# Patient Record
Sex: Female | Born: 1965 | Race: White | Hispanic: No | Marital: Married | Smoking: Never smoker
Health system: Southern US, Community
[De-identification: ages and names within clinical notes are randomized; demographics above are authoritative.]

## PROBLEM LIST (undated history)

## (undated) DIAGNOSIS — Z800 Family history of malignant neoplasm of digestive organs: Secondary | ICD-10-CM

## (undated) DIAGNOSIS — C801 Malignant (primary) neoplasm, unspecified: Secondary | ICD-10-CM

## (undated) DIAGNOSIS — Z86010 Personal history of colonic polyps: Secondary | ICD-10-CM

## (undated) DIAGNOSIS — C50919 Malignant neoplasm of unspecified site of unspecified female breast: Secondary | ICD-10-CM

## (undated) DIAGNOSIS — Z803 Family history of malignant neoplasm of breast: Secondary | ICD-10-CM

## (undated) DIAGNOSIS — K219 Gastro-esophageal reflux disease without esophagitis: Secondary | ICD-10-CM

## (undated) DIAGNOSIS — Z801 Family history of malignant neoplasm of trachea, bronchus and lung: Secondary | ICD-10-CM

## (undated) DIAGNOSIS — Z9221 Personal history of antineoplastic chemotherapy: Secondary | ICD-10-CM

## (undated) DIAGNOSIS — Z923 Personal history of irradiation: Secondary | ICD-10-CM

## (undated) HISTORY — PX: WISDOM TOOTH EXTRACTION: SHX21

## (undated) HISTORY — DX: Gastro-esophageal reflux disease without esophagitis: K21.9

## (undated) HISTORY — PX: SKIN CANCER EXCISION: SHX779

## (undated) HISTORY — PX: COLONOSCOPY: SHX174

## (undated) HISTORY — DX: Personal history of antineoplastic chemotherapy: Z92.21

## (undated) HISTORY — DX: Personal history of irradiation: Z92.3

## (undated) HISTORY — PX: DILATION AND CURETTAGE OF UTERUS: SHX78

## (undated) HISTORY — DX: Malignant neoplasm of unspecified site of unspecified female breast: C50.919

## (undated) HISTORY — DX: Personal history of colonic polyps: Z86.010

## (undated) HISTORY — DX: Family history of malignant neoplasm of breast: Z80.3

## (undated) HISTORY — PX: PARTIAL HYSTERECTOMY: SHX80

## (undated) HISTORY — DX: Family history of malignant neoplasm of digestive organs: Z80.0

## (undated) HISTORY — DX: Malignant (primary) neoplasm, unspecified: C80.1

## (undated) HISTORY — DX: Family history of malignant neoplasm of trachea, bronchus and lung: Z80.1

---

## 1998-05-16 ENCOUNTER — Other Ambulatory Visit: Admission: RE | Admit: 1998-05-16 | Discharge: 1998-05-16 | Payer: Self-pay | Admitting: Obstetrics & Gynecology

## 1999-05-28 ENCOUNTER — Other Ambulatory Visit: Admission: RE | Admit: 1999-05-28 | Discharge: 1999-05-28 | Payer: Self-pay | Admitting: Obstetrics & Gynecology

## 2000-06-22 ENCOUNTER — Other Ambulatory Visit: Admission: RE | Admit: 2000-06-22 | Discharge: 2000-06-22 | Payer: Self-pay | Admitting: Obstetrics & Gynecology

## 2001-08-27 ENCOUNTER — Other Ambulatory Visit: Admission: RE | Admit: 2001-08-27 | Discharge: 2001-08-27 | Payer: Self-pay | Admitting: Obstetrics & Gynecology

## 2002-08-11 ENCOUNTER — Encounter: Payer: Self-pay | Admitting: Family Medicine

## 2002-08-11 ENCOUNTER — Ambulatory Visit (HOSPITAL_COMMUNITY): Admission: RE | Admit: 2002-08-11 | Discharge: 2002-08-11 | Payer: Self-pay | Admitting: Family Medicine

## 2002-09-16 ENCOUNTER — Other Ambulatory Visit: Admission: RE | Admit: 2002-09-16 | Discharge: 2002-09-16 | Payer: Self-pay | Admitting: Obstetrics & Gynecology

## 2003-09-28 ENCOUNTER — Other Ambulatory Visit: Admission: RE | Admit: 2003-09-28 | Discharge: 2003-09-28 | Payer: Self-pay | Admitting: Obstetrics & Gynecology

## 2004-11-11 ENCOUNTER — Other Ambulatory Visit: Admission: RE | Admit: 2004-11-11 | Discharge: 2004-11-11 | Payer: Self-pay | Admitting: Obstetrics & Gynecology

## 2005-01-02 ENCOUNTER — Ambulatory Visit (HOSPITAL_COMMUNITY): Admission: RE | Admit: 2005-01-02 | Discharge: 2005-01-02 | Payer: Self-pay | Admitting: Obstetrics & Gynecology

## 2005-01-02 ENCOUNTER — Encounter (INDEPENDENT_AMBULATORY_CARE_PROVIDER_SITE_OTHER): Payer: Self-pay | Admitting: *Deleted

## 2008-01-19 ENCOUNTER — Encounter: Payer: Self-pay | Admitting: Internal Medicine

## 2008-01-19 ENCOUNTER — Ambulatory Visit: Payer: Self-pay | Admitting: Internal Medicine

## 2008-01-19 DIAGNOSIS — K625 Hemorrhage of anus and rectum: Secondary | ICD-10-CM | POA: Insufficient documentation

## 2008-02-07 ENCOUNTER — Ambulatory Visit: Payer: Self-pay | Admitting: Internal Medicine

## 2008-09-27 ENCOUNTER — Ambulatory Visit: Payer: Self-pay | Admitting: Internal Medicine

## 2008-09-27 ENCOUNTER — Ambulatory Visit (HOSPITAL_COMMUNITY): Admission: RE | Admit: 2008-09-27 | Discharge: 2008-09-27 | Payer: Self-pay | Admitting: Internal Medicine

## 2008-09-27 DIAGNOSIS — R1011 Right upper quadrant pain: Secondary | ICD-10-CM

## 2011-05-16 NOTE — Op Note (Signed)
Monica Padilla, Monica Padilla               ACCOUNT NO.:  [id number]   MEDICAL RECORD NO.:  [id number]          PATIENT TYPE:  AMB   LOCATION:  SDC                           FACILITY:  [facility]   PHYSICIAN:  Freddy Finner, M.D.   DATE OF BIRTH:  01/31/1966   DATE OF PROCEDURE:  01/02/2005  DATE OF DISCHARGE:                                 OPERATIVE REPORT   PREOPERATIVE DIAGNOSES:  Endometrial polyp, intramural myoma.   POSTOPERATIVE DIAGNOSES:  Endometrial polyp, intramural myoma.   OPERATIVE PROCEDURE:  Hysteroscopy, dilatation and curettage.   ANESTHESIA:  Managed intravenous sedation and paracervical block followed by  Toradol 30 mg IV, 30 mg IM on completion of the procedure for postoperative  analgesia.   SURGEON:  Dr. Jennette Kettle.   ESTIMATED BLOOD LOSS:  Less than 10 cc.   Approximately 100 cc.   INTRAOPERATIVE COMPLICATIONS:  None.   The patient is a 45 year old with increasing menorrhagia and flooding with  her menses.  Sonohysterogram in the office revealed an endometrial polyp and  an intramural myoma.  Options and therapy were discussed including NovaSure  which she has elected not to do at this point in time.  She was admitted now  for a hysteroscopy, D&C.   Intraoperative findings did reveal an endometrial polyp, intracavitary  synechia like lesions.  These were located to the left of midline.  The  polyp was anteriorly and superiorly in the endometrial cavity.   The patient was brought to the operating room and placed under adequate  intravenous sedation, placed in the dorsal lithotomy position, using the  Allen stirrup system.  Betadine prep was carried out in the standard  fashion.  Sterile drapes were applied.  The bivalve speculum was introduced.  The cervix was grasped on the anterior lip with a single-toothed tenaculum.  The uterus sounded to 9.5 cm.  The cervix was progressively dilated with  Pratt's to a #23.  A 12.5 degree ACMI hysteroscope was introduced.  Using  3%  Sorbitol as a distending medium, inspection revealed a polyp as noted above  and synechia on the left.  Gentle thorough curettage was carried out using a  sharp curet, a Haney curet.  Exploration with Randall-Stone forceps was  performed to recover tissue including polyps.  Re-inspection revealed  adequate sampling of the endometrium, adequate resection of the synechia and  of the polyps.  The procedure was terminated.  The instruments were removed.  The patient  was taken to recovery room in good condition.  She will be given routine  outpatient surgical instructions.  She was given Darvocet-N 100 to be taken  as needed for pain not relieved by ibuprofen.     Hosie Spangle   WRN/MEDQ  D:  01/02/2005  T:  01/02/2005  Job:  [id number]

## 2013-03-21 ENCOUNTER — Encounter: Payer: Self-pay | Admitting: Internal Medicine

## 2013-09-09 ENCOUNTER — Encounter: Payer: Self-pay | Admitting: Internal Medicine

## 2013-09-14 ENCOUNTER — Encounter: Payer: Self-pay | Admitting: Internal Medicine

## 2013-09-20 ENCOUNTER — Ambulatory Visit (AMBULATORY_SURGERY_CENTER): Payer: Self-pay | Admitting: *Deleted

## 2013-09-20 VITALS — Ht 61.0 in | Wt 121.0 lb

## 2013-09-20 DIAGNOSIS — Z800 Family history of malignant neoplasm of digestive organs: Secondary | ICD-10-CM

## 2013-09-20 MED ORDER — NA SULFATE-K SULFATE-MG SULF 17.5-3.13-1.6 GM/177ML PO SOLN: ORAL | Status: DC

## 2013-09-20 NOTE — Progress Notes (Signed)
No egg or soy allergy 

## 2013-09-22 ENCOUNTER — Encounter: Payer: Self-pay | Admitting: Internal Medicine

## 2013-10-04 ENCOUNTER — Ambulatory Visit (AMBULATORY_SURGERY_CENTER): Payer: BC Managed Care – PPO | Admitting: Internal Medicine

## 2013-10-04 ENCOUNTER — Encounter: Payer: Self-pay | Admitting: Internal Medicine

## 2013-10-04 VITALS — BP 108/67 | HR 56 | Temp 97.7°F | Resp 18 | Ht 61.0 in | Wt 121.0 lb

## 2013-10-04 DIAGNOSIS — Z800 Family history of malignant neoplasm of digestive organs: Secondary | ICD-10-CM

## 2013-10-04 DIAGNOSIS — Z1211 Encounter for screening for malignant neoplasm of colon: Secondary | ICD-10-CM

## 2013-10-04 DIAGNOSIS — D126 Benign neoplasm of colon, unspecified: Secondary | ICD-10-CM

## 2013-10-04 MED ORDER — SODIUM CHLORIDE 0.9 % IV SOLN: 500.0000 mL | INTRAVENOUS | Status: DC

## 2013-10-04 NOTE — Progress Notes (Signed)
Patient did not experience any of the following events: a burn prior to discharge; a fall within the facility; wrong site/side/patient/procedure/implant event; or a hospital transfer or hospital admission upon discharge from the facility. (G8907)Patient did not have preoperative order for IV antibiotic SSI prophylaxis. (G8918) ewm 

## 2013-10-04 NOTE — Progress Notes (Signed)
Called to room to assist during endoscopic procedure.  Patient ID and intended procedure confirmed with present staff. Received instructions for my participation in the procedure from the performing physician.  

## 2013-10-04 NOTE — Patient Instructions (Addendum)
I found and removed one polyp that looks benign. Everything else was normal - prep was great.  I will let you know pathology results and when to have another routine colonoscopy by mail.  I appreciate the opportunity to care for you. Iva Boop, MD, FACG  YOU HAD AN ENDOSCOPIC PROCEDURE TODAY AT THE [facility]: Refer to the procedure report that was given to you for any specific questions about what was found during the examination.  If the procedure report does not answer your questions, please call your gastroenterologist to clarify.  If you requested that your care partner not be given the details of your procedure findings, then the procedure report has been included in a sealed envelope for you to review at your convenience later.  YOU SHOULD EXPECT: Some feelings of bloating in the abdomen. Passage of more gas than usual.  Walking can help get rid of the air that was put into your GI tract during the procedure and reduce the bloating. If you had a lower endoscopy (such as a colonoscopy or flexible sigmoidoscopy) you may notice spotting of blood in your stool or on the toilet paper. If you underwent a bowel prep for your procedure, then you may not have a normal bowel movement for a few days.  DIET: Your first meal following the procedure should be a light meal and then it is ok to progress to your normal diet.  A half-sandwich or bowl of soup is an example of a good first meal.  Heavy or fried foods are harder to digest and may make you feel nauseous or bloated.  Likewise meals heavy in dairy and vegetables can cause extra gas to form and this can also increase the bloating.  Drink plenty of fluids but you should avoid alcoholic beverages for 24 hours.  ACTIVITY: Your care partner should take you home directly after the procedure.  You should plan to take it easy, moving slowly for the rest of the day.  You can resume normal activity the day after the procedure however you  should NOT DRIVE or use heavy machinery for 24 hours (because of the sedation medicines used during the test).    SYMPTOMS TO REPORT IMMEDIATELY: A gastroenterologist can be reached at any hour.  During normal business hours, 8:30 AM to 5:00 PM Monday through Friday, call [phone].  After hours and on weekends, please call the GI answering service at [phone]  Emergency number who will take a message and have the physician on call contact you.   Following lower endoscopy (colonoscopy or flexible sigmoidoscopy):  Excessive amounts of blood in the stool  Significant tenderness or worsening of abdominal pains  Swelling of the abdomen that is new, acute  Fever of 100F or higher  FOLLOW UP: If any biopsies were taken you will be contacted by phone or by letter within the next 1-3 weeks.  Call your gastroenterologist if you have not heard about the biopsies in 3 weeks.  Our staff will call the home number listed on your records the next business day following your procedure to check on you and address any questions or concerns that you may have at that time regarding the information given to you following your procedure. This is a courtesy call and so if there is no answer at the home number and we have not heard from you through the emergency physician on call, we will assume that you have returned to your regular daily activities  without incident.  SIGNATURES/CONFIDENTIALITY: You and/or your care partner have signed paperwork which will be entered into your electronic medical record.  These signatures attest to the fact that that the information above on your After Visit Summary has been reviewed and is understood.  Full responsibility of the confidentiality of this discharge information lies with you and/or your care-partner.  Handout on polyps

## 2013-10-04 NOTE — Op Note (Signed)
[facility] [address] [place], [address]   COLONOSCOPY PROCEDURE REPORT  PATIENT: Monica Padilla, Monica Padilla  MR#: [id number] BIRTHDATE: 20-Mar-1966 , 47  yrs. old GENDER: Female ENDOSCOPIST: Iva Boop, MD, [facility] PROCEDURE DATE:  10/04/2013 PROCEDURE:   Colonoscopy with snare polypectomy First Screening Colonoscopy - Avg.  risk and is 50 yrs.  old or older - No.  Prior Negative Screening - Now for repeat screening. Above average risk  History of Adenoma - Now for follow-up colonoscopy & has been > or = to 3 yrs.  N/A  Polyps Removed Today? Yes. ASA CLASS:   Class II INDICATIONS:Patient's immediate family history of colon cancer and elevated risk screening.   Father had metastatic colon cancer at 39. MEDICATIONS: propofol (Diprivan) 250mg  IV, MAC sedation, administered by CRNA, and These medications were titrated to patient response per physician's verbal order  DESCRIPTION OF PROCEDURE:   After the risks benefits and alternatives of the procedure were thoroughly explained, informed consent was obtained.  A digital rectal exam revealed no abnormalities of the rectum.   The LB [id number]  endoscope was introduced through the anus and advanced to the cecum, which was identified by both the appendix and ileocecal valve. No adverse events experienced.   The quality of the prep was excellent using Suprep  The instrument was then slowly withdrawn as the colon was fully examined.      COLON FINDINGS: A flat polyp measuring 1 cm in size was found in the distal transverse colon.  A polypectomy was performed with a cold snare.  The resection was complete and the polyp tissue was completely retrieved.   The colon mucosa was otherwise normal.   A right colon retroflexion was performed.  Retroflexed views revealed no abnormalities. The time to cecum=2 minutes 18 seconds. Withdrawal time=10 minutes 17 seconds.  The scope was withdrawn and the procedure  completed. COMPLICATIONS: There were no complications.  ENDOSCOPIC IMPRESSION: 1.   Flat polyp measuring 1 cm in size was found in the distal transverse colon; polypectomy was performed with a cold snare 2.   The colon mucosa was otherwise normal - excellent prep - second colonoscopy  RECOMMENDATIONS: Timing of repeat colonoscopy will be determined by pathology findings.   eSigned:  Iva Boop, MD, [facility] 10/04/2013 8:26 AM  cc: The Patient and Dario Ave, MD

## 2013-10-05 ENCOUNTER — Telehealth: Payer: Self-pay | Admitting: *Deleted

## 2013-10-05 NOTE — Telephone Encounter (Signed)
  Follow up Call-  Call back number 10/04/2013  Post procedure Call Back phone  # [phone]  Permission to leave phone message Yes     Patient questions:  Do you have a fever, pain , or abdominal swelling? no Pain Score  0 *  Have you tolerated food without any problems? yes  Have you been able to return to your normal activities? yes  Do you have any questions about your discharge instructions: Diet   no Medications  no Follow up visit  no  Do you have questions or concerns about your Care? no  Actions: * If pain score is 4 or above: No action needed, pain <4.

## 2013-10-12 ENCOUNTER — Encounter: Payer: Self-pay | Admitting: Internal Medicine

## 2013-10-12 NOTE — Progress Notes (Signed)
Quick Note:  Transverse 1cm + hyperplastic polyp and father had metastatic colon cancer at 71 Repeat colon 2019 ______

## 2016-06-28 HISTORY — PX: OTHER SURGICAL HISTORY: SHX169

## 2018-03-11 ENCOUNTER — Other Ambulatory Visit: Payer: Self-pay | Admitting: Obstetrics and Gynecology

## 2018-03-11 DIAGNOSIS — R928 Other abnormal and inconclusive findings on diagnostic imaging of breast: Secondary | ICD-10-CM

## 2018-03-17 ENCOUNTER — Ambulatory Visit
Admission: RE | Admit: 2018-03-17 | Discharge: 2018-03-17 | Disposition: A | Discharge status: Home / Self Care | Payer: BC Managed Care – PPO | Source: Ambulatory Visit | Attending: Obstetrics and Gynecology | Admitting: Obstetrics and Gynecology

## 2018-03-17 DIAGNOSIS — R928 Other abnormal and inconclusive findings on diagnostic imaging of breast: Secondary | ICD-10-CM

## 2018-03-29 HISTORY — PX: OTHER SURGICAL HISTORY: SHX169

## 2018-04-20 ENCOUNTER — Encounter: Payer: Self-pay | Admitting: Oncology

## 2018-04-20 ENCOUNTER — Telehealth: Payer: Self-pay | Admitting: Oncology

## 2018-04-20 NOTE — Telephone Encounter (Signed)
Received a call from [place] at Dr. Venita Sheffield office to schedule an appointment for new breast patient. Pt has been scheduled to see Dr. Jana Hakim on 5/6 at 4pm. Kenney Houseman will notify the pt. Letter and directions mailed to the pt.

## 2018-04-26 NOTE — Progress Notes (Addendum)
[place]  Telephone:(336) [phone] Fax:(336) [phone]     ID: Monica Padilla DOB: 06-29-1966  MR#: [id number]  [facility]#:[id number]  Patient Care Team: Algis Greenhouse, MD as PCP - General (Family Medicine) Signe Colt, MD as Referring Physician (Obstetrics and Gynecology) Nishtha Raider, Virgie Dad, MD as Consulting Physician (Oncology) Larey Dresser, MD as Consulting Physician (Cardiology) Noberto Retort Juanda Bond., MD as Referring Physician (Surgery) Gatha Mayer, MD as Consulting Physician (Gastroenterology) OTHER MD: Dr. Jimmye Norman and Dr. Michele Mcalpine at [place]: Estrogen receptor negative, HER-2 positive breast cancer  CURRENT TREATMENT: Chemo/immunotherapy   HISTORY OF CURRENT ILLNESS: The patient noted a palpable mass in her left breast as she was laying down. She brought this to medical attention immediately and had left diagnostic mammography with tomography at breast center  03/11/2018 showing a 2.5 cm palpable mass in the lower outer quadrant of the left breast, with one slightly prominent lymph node that had increased cortical measurement.  Biopsy of this left breast mass 03/17/2018 showed ([id number]), invasive ductal carcinoma, grade 2, estrogen and progesterone receptor negative, with an MIB-1 of 80%, but HER-2 amplified with a signals ratio 5.51 and the number per cell 17.35.  She underwent left mastectomy and sentinel lymph node sampling, as well as right-sided port placement 04/02/2018 under Etheleen Mayhew at [facility] for a 2.6 cm invasive ductal carcinoma, grade 3, with 1 of 3 sentinel nodes showing a micrometastatic deposit, but all margins negative.  She had a port placed at the same time.  The patient's subsequent history is as detailed below.  INTERVAL HISTORY: [name] was evaluated in the breast cancer clinic on 05/03/2018 accompanied by her husband, [name].   REVIEW OF SYSTEMS: Keymani takes protonix for occasional  GERD. The patient denies unusual headaches, visual changes, nausea, vomiting, stiff neck, dizziness, or gait imbalance. There has been no cough, phlegm production, or pleurisy, no chest pain or pressure, and no change in bowel or bladder habits. The patient denies fever, rash, bleeding, unexplained fatigue or unexplained weight loss. A detailed review of systems was otherwise entirely negative.   PAST MEDICAL HISTORY: Past Medical History:  Diagnosis Date  . Cancer    basal cell CA- chest, leg  . GERD (gastroesophageal reflux disease)   s.   PAST SURGICAL HISTORY: Past Surgical History:  Procedure Laterality Date  . PARTIAL HYSTERECTOMY    . SKIN CANCER EXCISION      FAMILY HISTORY Family History  Problem Relation Age of Onset  . Colon cancer Father   . Esophageal cancer Neg Hx   . Stomach cancer Neg Hx   . Rectal cancer Neg Hx    The patient's father died due to colon cancer at age 22. The patient's mother is alive at age 92. The patient has 1 sister and no brothers. A paternal cousin was diagnosed with breast cancer in the early 68's (before 52). The patient's paternal grandfather passed away from colon cancer. The patient's father had 3 brothers, one of whom had lung cancer.   GYNECOLOGIC HISTORY:  No LMP recorded. Patient has had a hysterectomy. Menarche: 52 years old Age at first live birth: 52 years old She is GXP2.  The patient is status post partial hysterectomy due to uterine fibroids. She retains her cervix and ovaries.  After her hysterectomy she used Estradiol with no complications.  She stopped hormone replacement March 2019.  She is now having hot flashes and nigh sweats that only allow her  to sleep 3 hours at a time.   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 Her husband, [name], is a Hydrologist for alarms and cameras for [employer]. The patient's oldest son, [name], is 15 and works as a Civil engineer, contracting in  [place]. The patient's son, [name], is 65 an works for a Dispensing optician. [name] lives at home with the patient. Azana does not have any grandchildren. She attends [employer].      ADVANCED DIRECTIVES:    HEALTH MAINTENANCE: Social History   Tobacco Use  . Smoking status: Never Smoker  . Smokeless tobacco: Never Used  Substance Use Topics  . Alcohol use: Yes    Comment: occasional  . Drug use: No     Colonoscopy: 10/04/2013/ Dr. Carlean Purl / polyp removal  PAP:  Bone density: remote   Allergies  Allergen Reactions  . Codeine Itching  . Nitrofurantoin     REACTION: pruritis  . Amoxicillin Rash    Current Outpatient Medications  Medication Sig Dispense Refill  . Ascorbic Acid (VITAMIN C PO) Take 500 mg by mouth daily. With rose hips    . Cholecalciferol (VITAMIN D-1000 MAX ST) 1000 units tablet Take by mouth.    . ciprofloxacin (CIPRO) 750 MG tablet Take by mouth.    . CRANBERRY PO Take by mouth daily.    . NON FORMULARY Whole- mega new chapter  1056m daily    . pantoprazole (PROTONIX) 40 MG tablet Take by mouth.    . dexamethasone (DECADRON) 4 MG tablet Take 2 tablets (8 mg total) by mouth 2 (two) times daily. Start the day before Taxotere. Take once the day after, then 2 times a day x 2d. 30 tablet 1  . lidocaine-prilocaine (EMLA) cream Apply to affected area once 30 g 3  . prochlorperazine (COMPAZINE) 10 MG tablet Take 1 tablet (10 mg total) by mouth every 6 (six) hours as needed (Nausea or vomiting). 30 tablet 1   No current facility-administered medications for this visit.     OBJECTIVE: Middle-aged white woman who appears well  Vitals:   05/03/18 1602  BP: (!) 154/83  Resp: 18  Temp: 98.2 F (36.8 C)  SpO2: 100%     Body mass index is 23.68 kg/m.   Wt Readings from Last 3 Encounters:  05/03/18 125 lb 4.8 oz (56.8 kg)  10/04/13 121 lb (54.9 kg)  09/20/13 121 lb (54.9 kg)      ECOG FS:0 - Asymptomatic  Ocular: Sclerae unicteric, pupils round  and equal Ear-nose-throat: Oropharynx clear and moist Lymphatic: No cervical or supraclavicular adenopathy Lungs no rales or rhonchi Heart regular rate and rhythm Abd soft, nontender, positive bowel sounds MSK no focal spinal tenderness, no joint edema Neuro: non-focal, well-oriented, appropriate affect Breasts: The right breast is unremarkable.  The left breast is status post mastectomy.  The incision is healed nicely, with no dehiscence, erythema, or swelling.  Both axillae are benign.   LAB RESULTS:  CMP  No results found for: NA, K, CL, CO2, GLUCOSE, BUN, CREATININE, CALCIUM, PROT, ALBUMIN, AST, ALT, ALKPHOS, BILITOT, GFRNONAA, GFRAA  No results found for: TOTALPROTELP, ALBUMINELP, A1GS, A2GS, BETS, BETA2SER, GAMS, MSPIKE, SPEI  No results found for: KPAFRELGTCHN, LAMBDASER, KAPLAMBRATIO  No results found for: WBC, NEUTROABS, HGB, HCT, MCV, PLT  '@LASTCHEMISTRY' @  No results found for: LABCA2  No components found for: [id number] No results for input(s): INR in the last 168 hours.  No results found  for: LABCA2  No results found for: PZW258  No results found for: NID782  No results found for: UMP536  No results found for: CA2729  No components found for: HGQUANT  No results found for: CEA1 / No results found for: CEA1   No results found for: AFPTUMOR  No results found for: CHROMOGRNA  No results found for: PSA1  No visits with results within 3 Day(s) from this visit.  Latest known visit with results is:  No results found for any previous visit.    (this displays the last labs from the last 3 days)  No results found for: TOTALPROTELP, ALBUMINELP, A1GS, A2GS, BETS, BETA2SER, GAMS, MSPIKE, SPEI (this displays SPEP labs)  No results found for: KPAFRELGTCHN, LAMBDASER, KAPLAMBRATIO (kappa/lambda light chains)  No results found for: HGBA, HGBA2QUANT, HGBFQUANT, HGBSQUAN (Hemoglobinopathy evaluation)   No results found for: LDH  No results found for: IRON,  TIBC, IRONPCTSAT (Iron and TIBC)  No results found for: FERRITIN  Urinalysis No results found for: COLORURINE, APPEARANCEUR, LABSPEC, PHURINE, GLUCOSEU, HGBUR, BILIRUBINUR, KETONESUR, PROTEINUR, UROBILINOGEN, NITRITE, LEUKOCYTESUR   STUDIES: She had bilateral screening mamm[facility] at [facility] 07[facility]10/2017 and next bilateral screening/diagnostic mammography will be due July 2019.  ELIGIBLE FOR AVAILABLE RESEARCH PROTOCOL: not discussed  ASSESSMENT: 52 y.o. [place], [place] woman status post left mastectomy and sentinel lymph node sampling 04/02/2018 for a pT2 pN1, stage IIB invasive ductal carcinoma, grade 3, estrogen and progesterone receptor negative, but HER-2 amplified  (1) genetics testing pending  (2) adjuvant chemotherapy will consist of carboplatin and docetaxel given every 21 days x 6, starting 05/13/2018  (3) trastuzumab and Pertuzumab will start concurrently with chemotherapy and continue for 1 year  (4) adjuvant radiation to follow  (5) referral to plastics placed 05/03/2018    PLAN: We spent the better part of today's hour-long appointment discussing the biology of her diagnosis and the specifics of her situation. We first reviewed the fact that cancer is not one disease but more than 100 different diseases and that it is important to keep them separate-- otherwise when friends and relatives discuss their own cancer experiences with [place] confusion can result. Similarly we explained that if breast cancer spreads to the bone or liver, the patient would not have bone cancer or liver cancer, but breast cancer in the bone and breast cancer in the liver: one cancer in three places-- not 3 different cancers which otherwise would have to be treated in 3 different ways.  We discussed the difference between local and systemic therapy. In terms of loco-regional treatment, she has had a left mastectomy and sentinel lymph node sampling and will have adjuvant radiation of the completion  of chemotherapy.  She does qualify for genetics testing.  This is being arranged.  If she does carry a deleterious mutation she would be interested in bilateral mastectomies with reconstruction.  In any case she might want reconstruction of the left breast and since we frequently get the expansion done before radiation starts I have asked 1 of our plastic surgeons to meet with her sometime around June or so to began discussing that in more detail.  We then went into the rationale for systemic therapy. There is some risk that this cancer may have already spread to other parts of her body. Patients frequently ask at this point about bone scans, CAT scans and PET scans to find out if they have occult breast cancer somewhere else. The problem is that in early stage disease we are much more  likely to find false positives then true cancers and this would expose the patient to unnecessary procedures as well as unnecessary radiation. Scans cannot answer the question the patient really would like to know, which is whether she has microscopic disease elsewhere in her body. For those reasons we do not recommend them.  Of course we would proceed to aggressive evaluation of any symptoms that might suggest metastatic disease, but that is not the case here.  Next we went over the options for systemic therapy which are anti-estrogens, anti-HER-2 immunotherapy, and chemotherapy. [facility] does not meet criteria for anti-estrogen therapy.  She is a very good candidate for HER-2 immunotherapy and chemotherapy.  Specifically we discussed the standard combination of carboplatin, docetaxel, trastuzumab and Pertuzumab given every 21 days for 6 cycles.  We discussed the possible toxicities, side effects and complications as well as the possible benefits.  She would like to get started and our target start date is 05/13/2018.  She already has a port in place.  She will need an echocardiogram and that order has been placed.  Once we  have the echo date we will try to schedule her for "chemotherapy school" on the same day to save her trip.  Note that I have offered to change her treatments to [facility] which would be a lot closer to her and I reassured her that she would get exactly the same drugs there.  However she has a friend who does come here from [facility] and she would like to have at least the chemotherapy portion of her treatment here.  Since the time to treatment is short and my schedule is very booked this month, I went ahead and gave her a copy of the "roadmap" on how to take her supportive medications and explained how to use it untreated.  She understands she will need to take dexamethasone the day before her first treatment.  Shirly has a good understanding of the overall plan. She agrees with it. She knows the goal of treatment in her case is cure. She will call with any problems that may develop before her next visit here.   Daune Divirgilio, Virgie Dad, MD  05/03/18 5:30 PM Medical Oncology and Hematology [facility] [address] [place], [place] [address] Tel. [phone]    Fax. [phone]  This document serves as a record of services personally performed by Lurline Del, MD. It was created on his behalf by Sheron Nightingale, a trained medical scribe. The creation of this record is based on the scribe's personal observations and the provider's statements to them.   I have reviewed the above documentation for accuracy and completeness, and I agree with the above.

## 2018-04-28 ENCOUNTER — Telehealth: Payer: Self-pay

## 2018-04-28 NOTE — Telephone Encounter (Signed)
Returned patient call to verify appointment. Per 5/30 voice mail

## 2018-05-03 ENCOUNTER — Inpatient Hospital Stay: Payer: BC Managed Care – PPO | Attending: Oncology | Admitting: Oncology

## 2018-05-03 VITALS — BP 154/83 | Temp 98.2°F | Resp 18 | Ht 61.0 in | Wt 125.3 lb

## 2018-05-03 DIAGNOSIS — T451X5A Adverse effect of antineoplastic and immunosuppressive drugs, initial encounter: Secondary | ICD-10-CM | POA: Diagnosis not present

## 2018-05-03 DIAGNOSIS — Z9012 Acquired absence of left breast and nipple: Secondary | ICD-10-CM | POA: Insufficient documentation

## 2018-05-03 DIAGNOSIS — Z85828 Personal history of other malignant neoplasm of skin: Secondary | ICD-10-CM | POA: Insufficient documentation

## 2018-05-03 DIAGNOSIS — Z5112 Encounter for antineoplastic immunotherapy: Secondary | ICD-10-CM | POA: Insufficient documentation

## 2018-05-03 DIAGNOSIS — R21 Rash and other nonspecific skin eruption: Secondary | ICD-10-CM | POA: Insufficient documentation

## 2018-05-03 DIAGNOSIS — Z5189 Encounter for other specified aftercare: Secondary | ICD-10-CM | POA: Insufficient documentation

## 2018-05-03 DIAGNOSIS — C50512 Malignant neoplasm of lower-outer quadrant of left female breast: Secondary | ICD-10-CM | POA: Diagnosis not present

## 2018-05-03 DIAGNOSIS — Z171 Estrogen receptor negative status [ER-]: Secondary | ICD-10-CM

## 2018-05-03 DIAGNOSIS — Z5111 Encounter for antineoplastic chemotherapy: Secondary | ICD-10-CM | POA: Insufficient documentation

## 2018-05-03 MED ORDER — DEXAMETHASONE 4 MG PO TABS: 8.0000 mg | ORAL_TABLET | Freq: Two times a day (BID) | ORAL | 1 refills | Status: DC

## 2018-05-03 MED ORDER — LIDOCAINE-PRILOCAINE 2.5-2.5 % EX CREA: TOPICAL_CREAM | CUTANEOUS | 3 refills | Status: DC

## 2018-05-03 MED ORDER — LORAZEPAM 0.5 MG PO TABS: 0.5000 mg | ORAL_TABLET | Freq: Every evening | ORAL | 1 refills | Status: DC | PRN

## 2018-05-03 MED ORDER — GABAPENTIN 300 MG PO CAPS: 300.0000 mg | ORAL_CAPSULE | Freq: Every day | ORAL | 4 refills | Status: DC

## 2018-05-03 MED ORDER — PROCHLORPERAZINE MALEATE 10 MG PO TABS: 10.0000 mg | ORAL_TABLET | Freq: Four times a day (QID) | ORAL | 1 refills | Status: DC | PRN

## 2018-05-03 NOTE — Progress Notes (Signed)
START ON PATHWAY REGIMEN - Breast     A cycle is every 21 days:     Docetaxel      Carboplatin      Trastuzumab      Trastuzumab      Pertuzumab      Pertuzumab   **Always confirm dose/schedule in your pharmacy ordering system**    Patient Characteristics: Postoperative without Neoadjuvant Therapy (Pathologic Staging), Invasive Disease, Adjuvant Therapy, HER2 Positive, ER Negative/Unknown, Node Positive Therapeutic Status: Postoperative without Neoadjuvant Therapy (Pathologic Staging) AJCC Grade: G3 AJCC N Category: pN1 AJCC M Category: cM0 ER Status: Negative (-) AJCC 8 Stage Grouping: IIB HER2 Status: Positive (+) Oncotype Dx Recurrence Score: Not Appropriate AJCC T Category: pT2 PR Status: Negative (-) Intent of Therapy: Curative Intent, Discussed with Patient

## 2018-05-04 ENCOUNTER — Telehealth: Payer: Self-pay | Admitting: Oncology

## 2018-05-04 NOTE — Telephone Encounter (Signed)
Per 5/6 no los °

## 2018-05-06 ENCOUNTER — Ambulatory Visit (HOSPITAL_COMMUNITY): Payer: BC Managed Care – PPO | Attending: Internal Medicine

## 2018-05-06 ENCOUNTER — Encounter: Payer: Self-pay | Admitting: Oncology

## 2018-05-06 ENCOUNTER — Other Ambulatory Visit: Payer: Self-pay

## 2018-05-06 DIAGNOSIS — Z171 Estrogen receptor negative status [ER-]: Secondary | ICD-10-CM

## 2018-05-06 DIAGNOSIS — C50512 Malignant neoplasm of lower-outer quadrant of left female breast: Secondary | ICD-10-CM

## 2018-05-06 DIAGNOSIS — C50212 Malignant neoplasm of upper-inner quadrant of left female breast: Secondary | ICD-10-CM | POA: Insufficient documentation

## 2018-05-06 NOTE — Progress Notes (Signed)
Called patient and left voicemail to introduce myself and to advise her of resources that may be available to her(i.e. [employer], [place] for Herceptin and Perjeta and [place] complete for [employer]. Left my contact name and number.

## 2018-05-07 ENCOUNTER — Encounter: Payer: Self-pay | Admitting: Oncology

## 2018-05-07 NOTE — Progress Notes (Signed)
Patient returned call.  Introduced myself as Arboriculturist and asked if she had any financial questions or concerns regarding treatment. She states not that she can think of. Asked if she knows if she has met her ded/OOP for insurance. She states she is not sure. Advised her of available copay assistance if she has not. Advised her to contact her insurance company to obtain this information and call me back if she would like to take advantage of this assistance. She verbalized understanding.  Patient returned my call and states she has not met her OOP but still has some claims pending. Advised her I proceed with enrolling her in the programs. She was very Patent attorney. Advised her that as far as sending claims to the company, this will be handled on this end. She verbalized understanding.  Enrolled patient in copay assistance through [facility] for Herceptin and Perjeta. Patient approved for $25,0000 for the calendar year only leaving her with a $5 copay for Herceptin after insurance pays.  Patient approved for $25,0000 for the calendar year only leaving her with a $5 copay for Perjeta after insurance pays.   Enrolled patient in [place] complete copay assistance for [employer]. Patient approved for maximum amount of $15,000 for the calendar year and it will leave her with a $0 copay after insurance pays.

## 2018-05-10 ENCOUNTER — Telehealth: Payer: Self-pay | Admitting: Oncology

## 2018-05-10 ENCOUNTER — Other Ambulatory Visit: Payer: Self-pay | Admitting: Oncology

## 2018-05-10 NOTE — Telephone Encounter (Signed)
Scheduled appt per 5/13 sch msg - left vm for pt re appts.

## 2018-05-10 NOTE — Progress Notes (Signed)
52 y.o. Monica Padilla, Monica Padilla woman status post left mastectomy and sentinel lymph node sampling 04/02/2018 for a pT2 pN1, stage IIB invasive ductal carcinoma, grade 3, estrogen and progesterone receptor negative, but HER-2 amplified  (1) genetics testing pending  (2) adjuvant chemotherapy will consist of carboplatin and docetaxel given every 21 days x 6, starting 05/13/2018  (3) trastuzumab and Pertuzumab will start concurrently with chemotherapy and continue for 1 year  (4) adjuvant radiation to follow  (5) referral to plastics placed 05/03/2018

## 2018-05-11 ENCOUNTER — Inpatient Hospital Stay: Payer: BC Managed Care – PPO

## 2018-05-13 ENCOUNTER — Inpatient Hospital Stay: Payer: BC Managed Care – PPO

## 2018-05-13 ENCOUNTER — Other Ambulatory Visit (HOSPITAL_COMMUNITY): Payer: Self-pay | Admitting: Pharmacist

## 2018-05-13 ENCOUNTER — Inpatient Hospital Stay (HOSPITAL_BASED_OUTPATIENT_CLINIC_OR_DEPARTMENT_OTHER): Payer: BC Managed Care – PPO | Admitting: Oncology

## 2018-05-13 ENCOUNTER — Telehealth: Payer: Self-pay | Admitting: *Deleted

## 2018-05-13 VITALS — BP 133/83 | HR 65 | Temp 98.4°F | Resp 17

## 2018-05-13 VITALS — BP 142/77 | HR 90 | Temp 98.1°F | Resp 18 | Ht 61.0 in | Wt 125.3 lb

## 2018-05-13 DIAGNOSIS — Z171 Estrogen receptor negative status [ER-]: Principal | ICD-10-CM

## 2018-05-13 DIAGNOSIS — C50512 Malignant neoplasm of lower-outer quadrant of left female breast: Secondary | ICD-10-CM

## 2018-05-13 DIAGNOSIS — Z85828 Personal history of other malignant neoplasm of skin: Secondary | ICD-10-CM

## 2018-05-13 DIAGNOSIS — Z9012 Acquired absence of left breast and nipple: Secondary | ICD-10-CM

## 2018-05-13 DIAGNOSIS — Z5111 Encounter for antineoplastic chemotherapy: Secondary | ICD-10-CM | POA: Diagnosis not present

## 2018-05-13 LAB — COMPREHENSIVE METABOLIC PANEL
ALT: 24 U/L (ref 0–55)
AST: 24 U/L (ref 5–34)
Albumin: 4.4 g/dL (ref 3.5–5.0)
Alkaline Phosphatase: 83 U/L (ref 40–150)
Anion gap: 7 (ref 3–11)
BUN: 11 mg/dL (ref 7–26)
CHLORIDE: 108 mmol/L (ref 98–109)
CO2: 25 mmol/L (ref 22–29)
CREATININE: 0.67 mg/dL (ref 0.60–1.10)
Calcium: 10.1 mg/dL (ref 8.4–10.4)
Glucose, Bld: 116 mg/dL (ref 70–140)
Potassium: 3.6 mmol/L (ref 3.5–5.1)
SODIUM: 140 mmol/L (ref 136–145)
Total Bilirubin: 0.2 mg/dL (ref 0.2–1.2)
Total Protein: 7.2 g/dL (ref 6.4–8.3)

## 2018-05-13 LAB — CBC WITH DIFFERENTIAL/PLATELET
Basophils Absolute: 0 10*3/uL (ref 0.0–0.1)
Basophils Relative: 0 %
EOS ABS: 0 10*3/uL (ref 0.0–0.5)
Eosinophils Relative: 0 %
HCT: 37.2 % (ref 34.8–46.6)
HEMOGLOBIN: 12.6 g/dL (ref 11.6–15.9)
LYMPHS ABS: 1 10*3/uL (ref 0.9–3.3)
Lymphocytes Relative: 7 %
MCH: 30.4 pg (ref 25.1–34.0)
MCHC: 33.9 g/dL (ref 31.5–36.0)
MCV: 89.9 fL (ref 79.5–101.0)
MONOS PCT: 6 %
Monocytes Absolute: 0.8 10*3/uL (ref 0.1–0.9)
NEUTROS ABS: 11.2 10*3/uL — AB (ref 1.5–6.5)
NEUTROS PCT: 87 %
Platelets: 179 10*3/uL (ref 145–400)
RBC: 4.14 MIL/uL (ref 3.70–5.45)
RDW: 12.8 % (ref 11.2–14.5)
WBC: 13 10*3/uL — AB (ref 3.9–10.3)

## 2018-05-13 MED ORDER — PALONOSETRON HCL INJECTION 0.25 MG/5ML
INTRAVENOUS | Status: AC
Start: 2018-05-13 — End: ?
  Filled 2018-05-13: qty 5

## 2018-05-13 MED ORDER — SODIUM CHLORIDE 0.9 % IV SOLN
Freq: Once | INTRAVENOUS | Status: AC
  Administered 2018-05-13: 11:00:00 via INTRAVENOUS

## 2018-05-13 MED ORDER — HEPARIN SOD (PORK) LOCK FLUSH 100 UNIT/ML IV SOLN
500.0000 [IU] | Freq: Once | INTRAVENOUS | Status: AC | PRN
  Administered 2018-05-13: 500 [IU]
  Filled 2018-05-13: qty 5

## 2018-05-13 MED ORDER — SODIUM CHLORIDE 0.9 % IV SOLN
420.0000 mg | Freq: Once | INTRAVENOUS | Status: AC
  Administered 2018-05-13: 420 mg via INTRAVENOUS
  Filled 2018-05-13: qty 14

## 2018-05-13 MED ORDER — TRASTUZUMAB CHEMO 150 MG IV SOLR
450.0000 mg | Freq: Once | INTRAVENOUS | Status: AC
  Administered 2018-05-13: 450 mg via INTRAVENOUS
  Filled 2018-05-13: qty 21.43

## 2018-05-13 MED ORDER — SODIUM CHLORIDE 0.9 % IV SOLN
Freq: Once | INTRAVENOUS | Status: AC
  Administered 2018-05-13: 16:00:00 via INTRAVENOUS
  Filled 2018-05-13: qty 5

## 2018-05-13 MED ORDER — DOCETAXEL CHEMO INJECTION 160 MG/16ML
75.0000 mg/m2 | Freq: Once | INTRAVENOUS | Status: AC
  Administered 2018-05-13: 120 mg via INTRAVENOUS
  Filled 2018-05-13: qty 12

## 2018-05-13 MED ORDER — SODIUM CHLORIDE 0.9 % IV SOLN
498.0000 mg | Freq: Once | INTRAVENOUS | Status: AC
  Administered 2018-05-13: 500 mg via INTRAVENOUS
  Filled 2018-05-13: qty 50

## 2018-05-13 MED ORDER — DIPHENHYDRAMINE HCL 25 MG PO CAPS
ORAL_CAPSULE | ORAL | Status: AC
  Filled 2018-05-13: qty 2

## 2018-05-13 MED ORDER — ACETAMINOPHEN 325 MG PO TABS
ORAL_TABLET | ORAL | Status: AC
  Filled 2018-05-13: qty 2

## 2018-05-13 MED ORDER — SODIUM CHLORIDE 0.9% FLUSH
10.0000 mL | INTRAVENOUS | Status: DC | PRN
  Administered 2018-05-13: 10 mL
  Filled 2018-05-13: qty 10

## 2018-05-13 MED ORDER — DIPHENHYDRAMINE HCL 25 MG PO CAPS
50.0000 mg | ORAL_CAPSULE | Freq: Once | ORAL | Status: AC
  Administered 2018-05-13: 50 mg via ORAL

## 2018-05-13 MED ORDER — PALONOSETRON HCL INJECTION 0.25 MG/5ML
0.2500 mg | Freq: Once | INTRAVENOUS | Status: AC
  Administered 2018-05-13: 0.25 mg via INTRAVENOUS

## 2018-05-13 MED ORDER — ACETAMINOPHEN 325 MG PO TABS
650.0000 mg | ORAL_TABLET | Freq: Once | ORAL | Status: AC
  Administered 2018-05-13: 650 mg via ORAL

## 2018-05-13 NOTE — Addendum Note (Signed)
Addended by: Chauncey Cruel on: 05/13/2018 01:30 PM   Modules accepted: Orders

## 2018-05-13 NOTE — Progress Notes (Signed)
[facility]  [phone] [phone]) [phone]     ID: Monica Padilla DOB: [birth year]  MR#: [id number]  OMV#:[id number]  Patient Care Team: Algis Greenhouse, MD as PCP - General (Family Medicine) Signe Colt, MD as Referring Physician (Obstetrics and Gynecology) Miho Monda, Virgie Dad, MD as Consulting Physician (Oncology) Larey Dresser, MD as Consulting Physician (Cardiology) Noberto Retort Juanda Bond., MD as Referring Physician (Surgery) Gatha Mayer, MD as Consulting Physician (Gastroenterology) Nolon Nations, MD as Consulting Physician (Diagnostic Radiology) OTHER MD: Dr. Jimmye Norman and Dr. Michele Mcalpine at [facility]: Estrogen receptor negative, HER-2 positive breast cancer  CURRENT TREATMENT: Chemo/immunotherapy   HISTORY OF CURRENT ILLNESS: From the original intake note:  The patient noted a palpable mass in her left breast as she was laying down. She brought this to medical attention immediately and had left diagnostic mammography with tomography at breast center  03/11/2018 showing a 2.5 cm palpable mass in the lower outer quadrant of the left breast, with one slightly prominent lymph node that had increased cortical measurement.  Biopsy of this left breast mass 03/17/2018 showed ([id number]), invasive ductal carcinoma, grade 2, estrogen and progesterone receptor negative, with an MIB-1 of 80%, but HER-2 amplified with a signals ratio 5.51 and the number per cell 17.35.  She underwent left mastectomy and sentinel lymph node sampling, as well as right-sided port placement 04/02/2018 under Etheleen Mayhew at [facility] for a 2.6 cm invasive ductal carcinoma, grade 3, with 1 of 3 sentinel nodes showing a micrometastatic deposit, but all margins negative.  She had a port placed at the same time.  The patient's subsequent history is as detailed below.  INTERVAL HISTORY: Kalaysia returns today for a follow-up and treatment of her  estrogen receptor negative, HER-2 positive breast cancer. She is accompanied by her husband, [name]. Today is day 1 cycle 1 of trastuzumab/Pertuzumab/carboplatin/docetaxel, which she will receive every 21 days for 6 cycles.  REVIEW OF SYSTEMS: Captola is doing well. She has been staying with work and gardening. She reports her flowers look great. She did not have any trouble sleeping last night after taking steroids for her first treatment today. She denies unusual headaches, visual changes, nausea, vomiting, or dizziness. There has been no unusual cough, phlegm production, or pleurisy. This been no change in bowel or bladder habits. She denies unexplained fatigue or unexplained weight loss, bleeding, rash, or fever. A detailed review of systems was otherwise noncontributory.    PAST MEDICAL HISTORY: Past Medical History:  Diagnosis Date  . Cancer    basal cell CA- chest, leg  . GERD (gastroesophageal reflux disease)   s.   PAST SURGICAL HISTORY: Past Surgical History:  Procedure Laterality Date  . PARTIAL HYSTERECTOMY    . SKIN CANCER EXCISION      FAMILY HISTORY Family History  Problem Relation Age of Onset  . Colon cancer Father   . Esophageal cancer Neg Hx   . Stomach cancer Neg Hx   . Rectal cancer Neg Hx    The patient's father died due to colon cancer at age 74. The patient's mother is alive at age 49. The patient has 1 sister and no brothers. A paternal cousin was diagnosed with breast cancer in the early 59's (before 19). The patient's paternal grandfather passed away from colon cancer. The patient's father had 3 brothers, one of whom had lung cancer.   GYNECOLOGIC HISTORY:  No LMP recorded. Patient has had a hysterectomy. Menarche: 52 years  old Age at first live birth: 52 years old She is GXP2.  The patient is status post partial hysterectomy due to uterine fibroids. She retains her cervix and ovaries.  After her hysterectomy she used Estradiol with no complications.  She  stopped hormone replacement March 2019.  She is now having hot flashes and nigh sweats that only allow her to sleep 3 hours at a time.   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 Her husband, [name], is a Hydrologist for alarms and cameras for [employer]. The patient's oldest son, [name], is 31 and works as a Civil engineer, contracting in [place]. The patient's son, [name], is 68 an works for a Dispensing optician. [name] lives at home with the patient. Britzy does not have any grandchildren. She attends [employer].      ADVANCED DIRECTIVES:    HEALTH MAINTENANCE: Social History   Tobacco Use  . Smoking status: Never Smoker  . Smokeless tobacco: Never Used  Substance Use Topics  . Alcohol use: Yes    Comment: occasional  . Drug use: No     Colonoscopy: 10/04/2013/ Dr. Carlean Purl / polyp removal  PAP:  Bone density: remote   Allergies  Allergen Reactions  . Codeine Itching  . Nitrofurantoin     REACTION: pruritis  . Amoxicillin Rash    Current Outpatient Medications  Medication Sig Dispense Refill  . Ascorbic Acid (VITAMIN C PO) Take 500 mg by mouth daily. With rose hips    . Cholecalciferol (VITAMIN D-1000 MAX ST) 1000 units tablet Take by mouth.    . ciprofloxacin (CIPRO) 750 MG tablet Take by mouth.    . CRANBERRY PO Take by mouth daily.    Marland Kitchen dexamethasone (DECADRON) 4 MG tablet Take 2 tablets (8 mg total) by mouth 2 (two) times daily. Start the day before Taxotere. Take once the day after, then 2 times a day x 2d. 30 tablet 1  . gabapentin (NEURONTIN) 300 MG capsule Take 1 capsule (300 mg total) by mouth at bedtime. 90 capsule 4  . lidocaine-prilocaine (EMLA) cream Apply to affected area once 30 g 3  . LORazepam (ATIVAN) 0.5 MG tablet Take 1 tablet (0.5 mg total) by mouth at bedtime as needed for anxiety. 30 tablet 1  . NON FORMULARY Whole- mega new chapter  1066m daily    . pantoprazole (PROTONIX) 40 MG tablet Take by  mouth.    . prochlorperazine (COMPAZINE) 10 MG tablet Take 1 tablet (10 mg total) by mouth every 6 (six) hours as needed (Nausea or vomiting). 30 tablet 1   No current facility-administered medications for this visit.     OBJECTIVE: Middle-aged white woman in no acute distress  Vitals:   05/13/18 0920  BP: (!) 142/77  Pulse: 90  Resp: 18  Temp: 98.1 F (36.7 C)  SpO2: 100%     Body mass index is 23.68 kg/m.   Wt Readings from Last 3 Encounters:  05/13/18 125 lb 4.8 oz (56.8 kg)  05/03/18 125 lb 4.8 oz (56.8 kg)  10/04/13 121 lb (54.9 kg)      ECOG FS:0 - Asymptomatic  Sclerae unicteric, EOMs intact Oropharynx clear and moist No cervical or supraclavicular adenopathy Lungs no rales or rhonchi Heart regular rate and rhythm Abd soft, nontender, positive bowel sounds MSK no focal spinal tenderness, no upper extremity lymphedema Neuro: nonfocal, well oriented, appropriate affect Breasts: The right breast is benign.  The left breast has undergone  mastectomy.  There is no evidence of residual or recurrent disease.  Both axillae are benign.  LAB RESULTS:  CMP  No results found for: NA, K, CL, CO2, GLUCOSE, BUN, CREATININE, CALCIUM, PROT, ALBUMIN, AST, ALT, ALKPHOS, BILITOT, GFRNONAA, GFRAA  No results found for: TOTALPROTELP, ALBUMINELP, A1GS, A2GS, BETS, BETA2SER, GAMS, MSPIKE, SPEI  No results found for: KPAFRELGTCHN, LAMBDASER, KAPLAMBRATIO  No results found for: WBC, NEUTROABS, HGB, HCT, MCV, PLT  '@LASTCHEMISTRY' @  No results found for: LABCA2  No components found for: LPFXTK240  No results for input(s): INR in the last 168 hours.  No results found for: LABCA2  No results found for: XBD532  No results found for: DJM426  No results found for: STM196  No results found for: CA2729  No components found for: HGQUANT  No results found for: CEA1 / No results found for: CEA1   No results found for: AFPTUMOR  No results found for: CHROMOGRNA  No results  found for: PSA1  No visits with results within 3 Day(s) from this visit.  Latest known visit with results is:  No results found for any previous visit.    (this displays the last labs from the last 3 days)  No results found for: TOTALPROTELP, ALBUMINELP, A1GS, A2GS, BETS, BETA2SER, GAMS, MSPIKE, SPEI (this displays SPEP labs)  No results found for: KPAFRELGTCHN, LAMBDASER, KAPLAMBRATIO (kappa/lambda light chains)  No results found for: HGBA, HGBA2QUANT, HGBFQUANT, HGBSQUAN (Hemoglobinopathy evaluation)   No results found for: LDH  No results found for: IRON, TIBC, IRONPCTSAT (Iron and TIBC)  No results found for: FERRITIN  Urinalysis No results found for: COLORURINE, APPEARANCEUR, LABSPEC, PHURINE, GLUCOSEU, HGBUR, BILIRUBINUR, KETONESUR, PROTEINUR, UROBILINOGEN, NITRITE, LEUKOCYTESUR   STUDIES: She had bilateral screening mamm[facility] at [facility] 07[facility]10[facility]2017 and next bilateral screening/diagnostic mammography will be due July 2019.  ELIGIBLE FOR AVAILABLE RESEARCH PROTOCOL: not discussed  ASSESSMENT: 52 y.o. [place], [place] woman status post left mastectomy and sentinel lymph node sampling 04/02/2018 for a pT2 pN1, stage IIB invasive ductal carcinoma, grade 3, estrogen and progesterone receptor negative, but HER-2 amplified  (1) genetics testing pending  (2) adjuvant chemotherapy will consist of carboplatin and docetaxel given every 21 days x 6, starting 05/13/2018  (3) trastuzumab and Pertuzumab will start concurrently with chemotherapy and continue for 1 year  (a) echocardiogram 05/06/2018 shows an ejection fraction in the 65-70%  (4) adjuvant radiation to follow  (5) referral to plastics placed 05/03/2018    PLAN: Sherlie took her steroids yesterday as instructed and had no significant problems with sleep.  She has a very good understanding of all her supportive medicines and she has a copy of the "roadmap" on how to take them.  She will start her chemotherapy  today.  We reviewed side effects toxicities and complications.  She will return on Saturday for her first Neulasta dose.  She will then return to see me 05/21/2018 at which time we will review complications.  I have asked her to keep a diary of side effects so we can optimize the subsequent cycles.  She is already prepared to lose her hair and has bought 2 weeks  I encouraged her to continue to be as active as possible through her treatments  She knows to call for any problems that may develop before the next visit.  Hideko Esselman, Virgie Dad, MD  05/13/18 9:48 AM Medical Oncology and Hematology [facility] [address] [place], [place] [address] Tel. [phone]    Fax. [phone]  This document  serves as a record of services personally performed by Chauncey Cruel, MD. It was created on his behalf by Margit Banda, a trained medical scribe. The creation of this record is based on the scribe's personal observations and the provider's statements to them.   I have reviewed the above documentation for accuracy and completeness, and I agree with the above.

## 2018-05-13 NOTE — Telephone Encounter (Signed)
Pt needs labs for dosing of carboplatin per scheduled treatment today.  No lab appointment scheduled.  Per MD - draw labs with port access in chemo room- may start premeds, herceptin, perjeta.   Carbo will be dosed upon creatinine per CMET drawn today.  Pharmacy informed of above with understanding and agreement to plan.   RN in treatment room notified of above.  Orders placed in chart/lab notified/ tubes and labels will be sent to treatment room.

## 2018-05-13 NOTE — Patient Instructions (Signed)
[facility] Discharge Instructions for Patients Receiving Chemotherapy  Today you received the following chemotherapy agents: Herceptin, Perjeta, Taxotere, Carboplatin    To help prevent nausea and vomiting after your treatment, we encourage you to take your nausea medication as prescribed.   If you develop nausea and vomiting that is not controlled by your nausea medication, call the clinic.   BELOW ARE SYMPTOMS THAT SHOULD BE REPORTED IMMEDIATELY:  *FEVER GREATER THAN 100.5 F  *CHILLS WITH OR WITHOUT FEVER  NAUSEA AND VOMITING THAT IS NOT CONTROLLED WITH YOUR NAUSEA MEDICATION  *UNUSUAL SHORTNESS OF BREATH  *UNUSUAL BRUISING OR BLEEDING  TENDERNESS IN MOUTH AND THROAT WITH OR WITHOUT PRESENCE OF ULCERS  *URINARY PROBLEMS  *BOWEL PROBLEMS  UNUSUAL RASH Items with * indicate a potential emergency and should be followed up as soon as possible.  Feel free to call the clinic should you have any questions or concerns. The clinic phone number is [phone].  Please show the [facility] at check-in to the Emergency Department and triage nurse.  Trastuzumab injection for infusion What is this medicine? TRASTUZUMAB (tras TOO zoo mab) is a monoclonal antibody. It is used to treat breast cancer and stomach cancer. This medicine may be used for other purposes; ask your health care provider or pharmacist if you have questions. COMMON BRAND NAME(S): Herceptin What should I tell my health care provider before I take this medicine? They need to know if you have any of these conditions: -heart disease -heart failure -lung or breathing disease, like asthma -an unusual or allergic reaction to trastuzumab, benzyl alcohol, or other medications, foods, dyes, or preservatives -pregnant or trying to get pregnant -breast-feeding How should I use this medicine? This drug is given as an infusion into a vein. It is administered in a hospital or clinic by a specially  trained health care professional. Talk to your pediatrician regarding the use of this medicine in children. This medicine is not approved for use in children. Overdosage: If you think you have taken too much of this medicine contact a poison control center or emergency room at once. NOTE: This medicine is only for you. Do not share this medicine with others. What if I miss a dose? It is important not to miss a dose. Call your doctor or health care professional if you are unable to keep an appointment. What may interact with this medicine? This medicine may interact with the following medications: -certain types of chemotherapy, such as daunorubicin, doxorubicin, epirubicin, and idarubicin This list may not describe all possible interactions. Give your health care provider a list of all the medicines, herbs, non-prescription drugs, or dietary supplements you use. Also tell them if you smoke, drink alcohol, or use illegal drugs. Some items may interact with your medicine. What should I watch for while using this medicine? Visit your doctor for checks on your progress. Report any side effects. Continue your course of treatment even though you feel ill unless your doctor tells you to stop. Call your doctor or health care professional for advice if you get a fever, chills or sore throat, or other symptoms of a cold or flu. Do not treat yourself. Try to avoid being around people who are sick. You may experience fever, chills and shaking during your first infusion. These effects are usually mild and can be treated with other medicines. Report any side effects during the infusion to your health care professional. Fever and chills usually do not happen with later infusions.  Do not become pregnant while taking this medicine or for 7 months after stopping it. Women should inform their doctor if they wish to become pregnant or think they might be pregnant. Women of child-bearing potential will need to have a  negative pregnancy test before starting this medicine. There is a potential for serious side effects to an unborn child. Talk to your health care professional or pharmacist for more information. Do not breast-feed an infant while taking this medicine or for 7 months after stopping it. Women must use effective birth control with this medicine. What side effects may I notice from receiving this medicine? Side effects that you should report to your doctor or health care professional as soon as possible: -allergic reactions like skin rash, itching or hives, swelling of the face, lips, or tongue -chest pain or palpitations -cough -dizziness -feeling faint or lightheaded, falls -fever -general ill feeling or flu-like symptoms -signs of worsening heart failure like breathing problems; swelling in your legs and feet -unusually weak or tired Side effects that usually do not require medical attention (report to your doctor or health care professional if they continue or are bothersome): -bone pain -changes in taste -diarrhea -joint pain -nausea/vomiting -weight loss This list may not describe all possible side effects. Call your doctor for medical advice about side effects. You may report side effects to FDA at 1-800-FDA-1088. Where should I keep my medicine? This drug is given in a hospital or clinic and will not be stored at home. NOTE: This sheet is a summary. It may not cover all possible information. If you have questions about this medicine, talk to your doctor, pharmacist, or health care provider.  2018 Elsevier/Gold Standard (2016-12-09 14:37:52) Pertuzumab injection What is this medicine? PERTUZUMAB (per TOOZ ue mab) is a monoclonal antibody. It is used to treat breast cancer. This medicine may be used for other purposes; ask your health care provider or pharmacist if you have questions. COMMON BRAND NAME(S): PERJETA What should I tell my health care provider before I take this  medicine? They need to know if you have any of these conditions: -heart disease -heart failure -high blood pressure -history of irregular heart beat -recent or ongoing radiation therapy -an unusual or allergic reaction to pertuzumab, other medicines, foods, dyes, or preservatives -pregnant or trying to get pregnant -breast-feeding How should I use this medicine? This medicine is for infusion into a vein. It is given by a health care professional in a hospital or clinic setting. Talk to your pediatrician regarding the use of this medicine in children. Special care may be needed. Overdosage: If you think you have taken too much of this medicine contact a poison control center or emergency room at once. NOTE: This medicine is only for you. Do not share this medicine with others. What if I miss a dose? It is important not to miss your dose. Call your doctor or health care professional if you are unable to keep an appointment. What may interact with this medicine? Interactions are not expected. Give your health care provider a list of all the medicines, herbs, non-prescription drugs, or dietary supplements you use. Also tell them if you smoke, drink alcohol, or use illegal drugs. Some items may interact with your medicine. This list may not describe all possible interactions. Give your health care provider a list of all the medicines, herbs, non-prescription drugs, or dietary supplements you use. Also tell them if you smoke, drink alcohol, or use illegal drugs. Some items may  interact with your medicine. What should I watch for while using this medicine? Your condition will be monitored carefully while you are receiving this medicine. Report any side effects. Continue your course of treatment even though you feel ill unless your doctor tells you to stop. Do not become pregnant while taking this medicine or for 7 months after stopping it. Women should inform their doctor if they wish to become  pregnant or think they might be pregnant. Women of child-bearing potential will need to have a negative pregnancy test before starting this medicine. There is a potential for serious side effects to an unborn child. Talk to your health care professional or pharmacist for more information. Do not breast-feed an infant while taking this medicine or for 7 months after stopping it. Women must use effective birth control with this medicine. Call your doctor or health care professional for advice if you get a fever, chills or sore throat, or other symptoms of a cold or flu. Do not treat yourself. Try to avoid being around people who are sick. You may experience fever, chills, and headache during the infusion. Report any side effects during the infusion to your health care professional. What side effects may I notice from receiving this medicine? Side effects that you should report to your doctor or health care professional as soon as possible: -breathing problems -chest pain or palpitations -dizziness -feeling faint or lightheaded -fever or chills -skin rash, itching or hives -sore throat -swelling of the face, lips, or tongue -swelling of the legs or ankles -unusually weak or tired Side effects that usually do not require medical attention (report to your doctor or health care professional if they continue or are bothersome): -diarrhea -hair loss -nausea, vomiting -tiredness This list may not describe all possible side effects. Call your doctor for medical advice about side effects. You may report side effects to FDA at 1-800-FDA-1088. Where should I keep my medicine? This drug is given in a hospital or clinic and will not be stored at home. NOTE: This sheet is a summary. It may not cover all possible information. If you have questions about this medicine, talk to your doctor, pharmacist, or health care provider.  2018 Elsevier/Gold Standard (2016-01-17 12:08:50) Docetaxel injection What is  this medicine? DOCETAXEL (doe se TAX el) is a chemotherapy drug. It targets fast dividing cells, like cancer cells, and causes these cells to die. This medicine is used to treat many types of cancers like breast cancer, certain stomach cancers, head and neck cancer, lung cancer, and prostate cancer. This medicine may be used for other purposes; ask your health care provider or pharmacist if you have questions. COMMON BRAND NAME(S): Docefrez, Taxotere What should I tell my health care provider before I take this medicine? They need to know if you have any of these conditions: -infection (especially a virus infection such as chickenpox, cold sores, or herpes) -liver disease -low blood counts, like low white cell, platelet, or red cell counts -an unusual or allergic reaction to docetaxel, polysorbate 80, other chemotherapy agents, other medicines, foods, dyes, or preservatives -pregnant or trying to get pregnant -breast-feeding How should I use this medicine? This drug is given as an infusion into a vein. It is administered in a hospital or clinic by a specially trained health care professional. Talk to your pediatrician regarding the use of this medicine in children. Special care may be needed. Overdosage: If you think you have taken too much of this medicine contact a poison control  center or emergency room at once. NOTE: This medicine is only for you. Do not share this medicine with others. What if I miss a dose? It is important not to miss your dose. Call your doctor or health care professional if you are unable to keep an appointment. What may interact with this medicine? -cyclosporine -erythromycin -ketoconazole -medicines to increase blood counts like filgrastim, pegfilgrastim, sargramostim -vaccines Talk to your doctor or health care professional before taking any of these medicines: -acetaminophen -aspirin -ibuprofen -ketoprofen -naproxen This list may not describe all possible  interactions. Give your health care provider a list of all the medicines, herbs, non-prescription drugs, or dietary supplements you use. Also tell them if you smoke, drink alcohol, or use illegal drugs. Some items may interact with your medicine. What should I watch for while using this medicine? Your condition will be monitored carefully while you are receiving this medicine. You will need important blood work done while you are taking this medicine. This drug may make you feel generally unwell. This is not uncommon, as chemotherapy can affect healthy cells as well as cancer cells. Report any side effects. Continue your course of treatment even though you feel ill unless your doctor tells you to stop. In some cases, you may be given additional medicines to help with side effects. Follow all directions for their use. Call your doctor or health care professional for advice if you get a fever, chills or sore throat, or other symptoms of a cold or flu. Do not treat yourself. This drug decreases your body's ability to fight infections. Try to avoid being around people who are sick. This medicine may increase your risk to bruise or bleed. Call your doctor or health care professional if you notice any unusual bleeding. This medicine may contain alcohol in the product. You may get drowsy or dizzy. Do not drive, use machinery, or do anything that needs mental alertness until you know how this medicine affects you. Do not stand or sit up quickly, especially if you are an older patient. This reduces the risk of dizzy or fainting spells. Avoid alcoholic drinks. Do not become pregnant while taking this medicine. Women should inform their doctor if they wish to become pregnant or think they might be pregnant. There is a potential for serious side effects to an unborn child. Talk to your health care professional or pharmacist for more information. Do not breast-feed an infant while taking this medicine. What side effects  may I notice from receiving this medicine? Side effects that you should report to your doctor or health care professional as soon as possible: -allergic reactions like skin rash, itching or hives, swelling of the face, lips, or tongue -low blood counts - This drug may decrease the number of white blood cells, red blood cells and platelets. You may be at increased risk for infections and bleeding. -signs of infection - fever or chills, cough, sore throat, pain or difficulty passing urine -signs of decreased platelets or bleeding - bruising, pinpoint red spots on the skin, black, tarry stools, nosebleeds -signs of decreased red blood cells - unusually weak or tired, fainting spells, lightheadedness -breathing problems -fast or irregular heartbeat -low blood pressure -mouth sores -nausea and vomiting -pain, swelling, redness or irritation at the injection site -pain, tingling, numbness in the hands or feet -swelling of the ankle, feet, hands -weight gain Side effects that usually do not require medical attention (report to your doctor or health care professional if they continue or are  bothersome): -bone pain -complete hair loss including hair on your head, underarms, pubic hair, eyebrows, and eyelashes -diarrhea -excessive tearing -changes in the color of fingernails -loosening of the fingernails -nausea -muscle pain -red flush to skin -sweating -weak or tired This list may not describe all possible side effects. Call your doctor for medical advice about side effects. You may report side effects to FDA at 1-800-FDA-1088. Where should I keep my medicine? This drug is given in a hospital or clinic and will not be stored at home. NOTE: This sheet is a summary. It may not cover all possible information. If you have questions about this medicine, talk to your doctor, pharmacist, or health care provider.  2018 Elsevier/Gold Standard (2016-01-17 12:32:56) Carboplatin injection What is this  medicine? CARBOPLATIN (KAR boe pla tin) is a chemotherapy drug. It targets fast dividing cells, like cancer cells, and causes these cells to die. This medicine is used to treat ovarian cancer and many other cancers. This medicine may be used for other purposes; ask your health care provider or pharmacist if you have questions. COMMON BRAND NAME(S): Paraplatin What should I tell my health care provider before I take this medicine? They need to know if you have any of these conditions: -blood disorders -hearing problems -kidney disease -recent or ongoing radiation therapy -an unusual or allergic reaction to carboplatin, cisplatin, other chemotherapy, other medicines, foods, dyes, or preservatives -pregnant or trying to get pregnant -breast-feeding How should I use this medicine? This drug is usually given as an infusion into a vein. It is administered in a hospital or clinic by a specially trained health care professional. Talk to your pediatrician regarding the use of this medicine in children. Special care may be needed. Overdosage: If you think you have taken too much of this medicine contact a poison control center or emergency room at once. NOTE: This medicine is only for you. Do not share this medicine with others. What if I miss a dose? It is important not to miss a dose. Call your doctor or health care professional if you are unable to keep an appointment. What may interact with this medicine? -medicines for seizures -medicines to increase blood counts like filgrastim, pegfilgrastim, sargramostim -some antibiotics like amikacin, gentamicin, neomycin, streptomycin, tobramycin -vaccines Talk to your doctor or health care professional before taking any of these medicines: -acetaminophen -aspirin -ibuprofen -ketoprofen -naproxen This list may not describe all possible interactions. Give your health care provider a list of all the medicines, herbs, non-prescription drugs, or dietary  supplements you use. Also tell them if you smoke, drink alcohol, or use illegal drugs. Some items may interact with your medicine. What should I watch for while using this medicine? Your condition will be monitored carefully while you are receiving this medicine. You will need important blood work done while you are taking this medicine. This drug may make you feel generally unwell. This is not uncommon, as chemotherapy can affect healthy cells as well as cancer cells. Report any side effects. Continue your course of treatment even though you feel ill unless your doctor tells you to stop. In some cases, you may be given additional medicines to help with side effects. Follow all directions for their use. Call your doctor or health care professional for advice if you get a fever, chills or sore throat, or other symptoms of a cold or flu. Do not treat yourself. This drug decreases your body's ability to fight infections. Try to avoid being around people who are  sick. This medicine may increase your risk to bruise or bleed. Call your doctor or health care professional if you notice any unusual bleeding. Be careful brushing and flossing your teeth or using a toothpick because you may get an infection or bleed more easily. If you have any dental work done, tell your dentist you are receiving this medicine. Avoid taking products that contain aspirin, acetaminophen, ibuprofen, naproxen, or ketoprofen unless instructed by your doctor. These medicines may hide a fever. Do not become pregnant while taking this medicine. Women should inform their doctor if they wish to become pregnant or think they might be pregnant. There is a potential for serious side effects to an unborn child. Talk to your health care professional or pharmacist for more information. Do not breast-feed an infant while taking this medicine. What side effects may I notice from receiving this medicine? Side effects that you should report to your  doctor or health care professional as soon as possible: -allergic reactions like skin rash, itching or hives, swelling of the face, lips, or tongue -signs of infection - fever or chills, cough, sore throat, pain or difficulty passing urine -signs of decreased platelets or bleeding - bruising, pinpoint red spots on the skin, black, tarry stools, nosebleeds -signs of decreased red blood cells - unusually weak or tired, fainting spells, lightheadedness -breathing problems -changes in hearing -changes in vision -chest pain -high blood pressure -low blood counts - This drug may decrease the number of white blood cells, red blood cells and platelets. You may be at increased risk for infections and bleeding. -nausea and vomiting -pain, swelling, redness or irritation at the injection site -pain, tingling, numbness in the hands or feet -problems with balance, talking, walking -trouble passing urine or change in the amount of urine Side effects that usually do not require medical attention (report to your doctor or health care professional if they continue or are bothersome): -hair loss -loss of appetite -metallic taste in the mouth or changes in taste This list may not describe all possible side effects. Call your doctor for medical advice about side effects. You may report side effects to FDA at 1-800-FDA-1088. Where should I keep my medicine? This drug is given in a hospital or clinic and will not be stored at home. NOTE: This sheet is a summary. It may not cover all possible information. If you have questions about this medicine, talk to your doctor, pharmacist, or health care provider.  2018 Elsevier/Gold Standard (2008-03-21 14:38:05)

## 2018-05-14 ENCOUNTER — Telehealth: Payer: Self-pay | Admitting: Oncology

## 2018-05-14 ENCOUNTER — Encounter: Payer: Self-pay | Admitting: Pharmacist

## 2018-05-14 NOTE — Telephone Encounter (Signed)
Added lab/fu 5/24. Spoke with patient she is aware. Other appointments remain the same.

## 2018-05-15 ENCOUNTER — Inpatient Hospital Stay: Payer: BC Managed Care – PPO

## 2018-05-15 VITALS — BP 149/81 | HR 68 | Temp 98.2°F | Resp 16

## 2018-05-15 DIAGNOSIS — Z5111 Encounter for antineoplastic chemotherapy: Secondary | ICD-10-CM | POA: Diagnosis not present

## 2018-05-15 DIAGNOSIS — C50512 Malignant neoplasm of lower-outer quadrant of left female breast: Secondary | ICD-10-CM

## 2018-05-15 DIAGNOSIS — Z171 Estrogen receptor negative status [ER-]: Principal | ICD-10-CM

## 2018-05-15 MED ORDER — PEGFILGRASTIM-CBQV 6 MG/0.6ML ~~LOC~~ SOSY
6.0000 mg | PREFILLED_SYRINGE | Freq: Once | SUBCUTANEOUS | Status: AC
  Administered 2018-05-15: 6 mg via SUBCUTANEOUS

## 2018-05-15 MED ORDER — PEGFILGRASTIM-CBQV 6 MG/0.6ML ~~LOC~~ SOSY
PREFILLED_SYRINGE | SUBCUTANEOUS | Status: AC
  Filled 2018-05-15: qty 0.6

## 2018-05-15 NOTE — Patient Instructions (Signed)
Pegfilgrastim injection What is this medicine? PEGFILGRASTIM (PEG fil gra stim) is a long-acting granulocyte colony-stimulating factor that stimulates the growth of neutrophils, a type of white blood cell important in the body's fight against infection. It is used to reduce the incidence of fever and infection in patients with certain types of cancer who are receiving chemotherapy that affects the bone marrow, and to increase survival after being exposed to high doses of radiation. This medicine may be used for other purposes; ask your health care provider or pharmacist if you have questions. COMMON BRAND NAME(S): Neulasta What should I tell my health care provider before I take this medicine? They need to know if you have any of these conditions: -kidney disease -latex allergy -ongoing radiation therapy -sickle cell disease -skin reactions to acrylic adhesives (On-Body Injector only) -an unusual or allergic reaction to pegfilgrastim, filgrastim, other medicines, foods, dyes, or preservatives -pregnant or trying to get pregnant -breast-feeding How should I use this medicine? This medicine is for injection under the skin. If you get this medicine at home, you will be taught how to prepare and give the pre-filled syringe or how to use the On-body Injector. Refer to the patient Instructions for Use for detailed instructions. Use exactly as directed. Tell your healthcare provider immediately if you suspect that the On-body Injector may not have performed as intended or if you suspect the use of the On-body Injector resulted in a missed or partial dose. It is important that you put your used needles and syringes in a special sharps container. Do not put them in a trash can. If you do not have a sharps container, call your pharmacist or healthcare provider to get one. Talk to your pediatrician regarding the use of this medicine in children. While this drug may be prescribed for selected conditions,  precautions do apply. Overdosage: If you think you have taken too much of this medicine contact a poison control center or emergency room at once. NOTE: This medicine is only for you. Do not share this medicine with others. What if I miss a dose? It is important not to miss your dose. Call your doctor or health care professional if you miss your dose. If you miss a dose due to an On-body Injector failure or leakage, a new dose should be administered as soon as possible using a single prefilled syringe for manual use. What may interact with this medicine? Interactions have not been studied. Give your health care provider a list of all the medicines, herbs, non-prescription drugs, or dietary supplements you use. Also tell them if you smoke, drink alcohol, or use illegal drugs. Some items may interact with your medicine. This list may not describe all possible interactions. Give your health care provider a list of all the medicines, herbs, non-prescription drugs, or dietary supplements you use. Also tell them if you smoke, drink alcohol, or use illegal drugs. Some items may interact with your medicine. What should I watch for while using this medicine? You may need blood work done while you are taking this medicine. If you are going to need a MRI, CT scan, or other procedure, tell your doctor that you are using this medicine (On-Body Injector only). What side effects may I notice from receiving this medicine? Side effects that you should report to your doctor or health care professional as soon as possible: -allergic reactions like skin rash, itching or hives, swelling of the face, lips, or tongue -dizziness -fever -pain, redness, or irritation at site   where injected -pinpoint red spots on the skin -red or dark-brown urine -shortness of breath or breathing problems -stomach or side pain, or pain at the shoulder -swelling -tiredness -trouble passing urine or change in the amount of urine Side  effects that usually do not require medical attention (report to your doctor or health care professional if they continue or are bothersome): -bone pain -muscle pain This list may not describe all possible side effects. Call your doctor for medical advice about side effects. You may report side effects to FDA at 1-800-FDA-1088. Where should I keep my medicine? Keep out of the reach of children. Store pre-filled syringes in a refrigerator between 2 and 8 degrees C (36 and 46 degrees F). Do not freeze. Keep in carton to protect from light. Throw away this medicine if it is left out of the refrigerator for more than 48 hours. Throw away any unused medicine after the expiration date. NOTE: This sheet is a summary. It may not cover all possible information. If you have questions about this medicine, talk to your doctor, pharmacist, or health care provider.  2018 Elsevier/Gold Standard (2016-12-11 12:58:03)  

## 2018-05-20 NOTE — Progress Notes (Signed)
Monica Padilla  [phone] [phone]     ID: Monica Padilla DOB: 12/17/1966  MR#: [id number]  EXB#:[id number]  Patient Care Team: Monica Greenhouse, MD as PCP - General (Family Medicine) Monica Colt, MD as Referring Physician (Obstetrics and Gynecology) Monica Padilla, Monica Dad, MD as Consulting Physician (Oncology) Monica Dresser, MD as Consulting Physician (Cardiology) Monica Retort Juanda Bond., MD as Referring Physician (Surgery) Monica Mayer, MD as Consulting Physician (Gastroenterology) Monica Nations, MD as Consulting Physician (Diagnostic Radiology) OTHER MD: Monica Padilla and Monica Padilla at Start: Estrogen receptor negative, HER-2 positive breast cancer  CURRENT TREATMENT: Chemo/immunotherapy   HISTORY OF CURRENT ILLNESS: From the original intake note:  The patient noted a palpable mass in her left breast as she was laying down. She brought this to medical attention immediately and had left diagnostic mammography with tomography at breast center  03/11/2018 showing a 2.5 cm palpable mass in the lower outer quadrant of the left breast, with one slightly prominent lymph node that had increased cortical measurement.  Biopsy of this left breast mass 03/17/2018 showed ([id number]), invasive ductal carcinoma, grade 2, estrogen and progesterone receptor negative, with an MIB-1 of 80%, but HER-2 amplified with a signals ratio 5.51 and the number per cell 17.35.  She underwent left mastectomy and sentinel lymph node sampling, as well as right-sided port placement 04/02/2018 under [facility] at [facility] for a 2.6 cm invasive ductal carcinoma, grade 3, with 1 of 3 sentinel nodes showing a micrometastatic deposit, but all margins negative.  She had a port placed at the same time.  The patient's subsequent history is as detailed below.  INTERVAL HISTORY: Monica Padilla returns today for a follow-up and treatment of her  estrogen receptor negative, HER-2 positive breast cancer. She is accompanied by her sister, Monica Padilla. She is being treated with carboplatin, docetaxel, trastuzumab and Pertuzumab.  Today is day 9 cycle 1 of 6 planned cycles repeated every 21 days.   Her echocardiogram on 05/06/2018 showed an excellent ejection fraction in the 65/70% range   REVIEW OF SYSTEMS: Monica Padilla tolerated her first dose well. However, days 5-7 were the worst for her. She experienced some diarrhea and took imodium for relief. Her diarrhea was watery. Her taste buds have mildly changed, but adds it is manageable. She has continued to work everyday. She didn't take Claritin one day and noticed discomfort in her back. Once she did take it, her pan went a way. The patient denies unusual headaches, visual changes, nausea, vomiting, or dizziness. There has been no unusual cough, phlegm production, or pleurisy. This been no change in bowel or bladder habits. The patient denies unexplained fatigue or unexplained weight loss, bleeding, rash, or fever. A detailed review of systems was otherwise noncontributory.    PAST MEDICAL HISTORY: Past Medical History:  Diagnosis Date  . Cancer    basal cell CA- chest, leg  . GERD (gastroesophageal reflux disease)   s.   PAST SURGICAL HISTORY: Past Surgical History:  Procedure Laterality Date  . PARTIAL HYSTERECTOMY    . SKIN CANCER EXCISION      FAMILY HISTORY Family History  Problem Relation Age of Onset  . Colon cancer Father   . Esophageal cancer Neg Hx   . Stomach cancer Neg Hx   . Rectal cancer Neg Hx    The patient's father died due to colon cancer at age 62. The patient's mother is alive at age 35. The patient has  1 sister and no brothers. A paternal cousin was diagnosed with breast cancer in the early 15's (before 83). The patient's paternal grandfather passed away from colon cancer. The patient's father had 3 brothers, one of whom had lung cancer.   GYNECOLOGIC HISTORY:  No  LMP recorded. Patient has had a hysterectomy. Menarche: 52 years old Age at first live birth: 52 years old She is GXP2.  The patient is status post partial hysterectomy due to uterine fibroids. She retains her cervix and ovaries.  After her hysterectomy she used Estradiol with no complications.  She stopped hormone replacement March 2019.  She is now having hot flashes and nigh sweats that only allow her to sleep 3 hours at a time.   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 Her husband, Monica Padilla, is a Hydrologist for alarms and cameras for [employer]. The patient's oldest son, Monica Padilla, is 61 and works as a Civil engineer, contracting in [place]. The patient's son, Monica Padilla, is 53 an works for a Dispensing optician. Monica Padilla lives at home with the patient. Monica Padilla does not have any grandchildren. She attends [employer].      ADVANCED DIRECTIVES:    HEALTH MAINTENANCE: Social History   Tobacco Use  . Smoking status: Never Smoker  . Smokeless tobacco: Never Used  Substance Use Topics  . Alcohol use: Yes    Comment: occasional  . Drug use: No     Colonoscopy: 10/04/2013/ Dr. Carlean Padilla / polyp removal  PAP:  Bone density: remote   Allergies  Allergen Reactions  . Codeine Itching  . Nitrofurantoin     REACTION: pruritis  . Amoxicillin Rash    Current Outpatient Medications  Medication Sig Dispense Refill  . Ascorbic Acid (VITAMIN C PO) Take 500 mg by mouth daily. With rose hips    . Cholecalciferol (VITAMIN D-1000 MAX ST) 1000 units tablet Take by mouth.    . CRANBERRY PO Take by mouth daily.    Marland Kitchen dexamethasone (DECADRON) 4 MG tablet Take 2 tablets (8 mg total) by mouth 2 (two) times daily. Start the day before Taxotere. Take once the day after, then 2 times a day x 2d. 30 tablet 1  . gabapentin (NEURONTIN) 300 MG capsule Take 1 capsule (300 mg total) by mouth at bedtime. 90 capsule 4  . lidocaine-prilocaine (EMLA) cream Apply to affected  area once 30 g 3  . LORazepam (ATIVAN) 0.5 MG tablet Take 1 tablet (0.5 mg total) by mouth at bedtime as needed for anxiety. 30 tablet 1  . pantoprazole (PROTONIX) 40 MG tablet Take by mouth.    . prochlorperazine (COMPAZINE) 10 MG tablet Take 1 tablet (10 mg total) by mouth every 6 (six) hours as needed (Nausea or vomiting). 30 tablet 1   No current facility-administered medications for this visit.     OBJECTIVE: Middle-aged white woman who appears well  Vitals:   05/21/18 1220  BP: 125/83  Pulse: 94  Resp: 18  Temp: 98.4 F (36.9 C)  SpO2: 99%     Body mass index is 23.45 kg/m.   Wt Readings from Last 3 Encounters:  05/21/18 124 lb 1.6 oz (56.3 kg)  05/13/18 125 lb 4.8 oz (56.8 kg)  05/03/18 125 lb 4.8 oz (56.8 kg)      ECOG FS:0 - Asymptomatic  Sclerae unicteric, pupils round and equal Oropharynx clear and moist No cervical or supraclavicular adenopathy Lungs no rales or rhonchi Heart regular rate and rhythm  Abd soft, nontender, positive bowel sounds MSK no focal spinal tenderness, no upper extremity lymphedema Neuro: nonfocal, well oriented, appropriate affect Breasts: The right breast is unremarkable.  The left breast is status post mastectomy.  The incision is healed nicely.  There is no evidence of local recurrence.  Both axillae are benign.  LAB RESULTS:  CMP     Component Value Date/Time   NA 140 05/13/2018 0950   K 3.6 05/13/2018 0950   CL 108 05/13/2018 0950   CO2 25 05/13/2018 0950   GLUCOSE 116 05/13/2018 0950   BUN 11 05/13/2018 0950   CREATININE 0.67 05/13/2018 0950   CALCIUM 10.1 05/13/2018 0950   PROT 7.2 05/13/2018 0950   ALBUMIN 4.4 05/13/2018 0950   AST 24 05/13/2018 0950   ALT 24 05/13/2018 0950   ALKPHOS 83 05/13/2018 0950   BILITOT 0.2 05/13/2018 0950   GFRNONAA >60 05/13/2018 0950   GFRAA >60 05/13/2018 0950    No results found for: TOTALPROTELP, ALBUMINELP, A1GS, A2GS, BETS, BETA2SER, GAMS, MSPIKE, SPEI  No results found for:  KPAFRELGTCHN, LAMBDASER, KAPLAMBRATIO  Lab Results  Component Value Date   WBC 21.1 (H) 05/21/2018   NEUTROABS 14.7 (H) 05/21/2018   HGB 11.7 05/21/2018   HCT 35.7 05/21/2018   MCV 92.5 05/21/2018   PLT 137 (L) 05/21/2018    _0 @  No results found for: LABCA2  No components found for: [id number]  No results for input(s): INR in the last 168 hours.  No results found for: LABCA2  No results found for: OIN867  No results found for: EHM094  No results found for: BSJ628  No results found for: CA2729  No components found for: HGQUANT  No results found for: CEA1 / No results found for: CEA1   No results found for: AFPTUMOR  No results found for: CHROMOGRNA  No results found for: PSA1  Appointment on 05/21/2018  Component Date Value Ref Range Status  . WBC 05/21/2018 21.1* 3.9 - 10.3 K/uL Final  . RBC 05/21/2018 3.86  3.70 - 5.45 MIL/uL Final  . Hemoglobin 05/21/2018 11.7  11.6 - 15.9 g/dL Final  . HCT 05/21/2018 35.7  34.8 - 46.6 % Final  . MCV 05/21/2018 92.5  79.5 - 101.0 fL Final  . MCH 05/21/2018 30.3  25.1 - 34.0 pg Final  . MCHC 05/21/2018 32.8  31.5 - 36.0 g/dL Final  . RDW 05/21/2018 12.9  11.2 - 14.5 % Final  . Platelets 05/21/2018 137* 145 - 400 K/uL Final  . Neutrophils Relative % 05/21/2018 69  % Final  . Neutro Abs 05/21/2018 14.7* 1.5 - 6.5 K/uL Final  . Lymphocytes Relative 05/21/2018 17  % Final  . Lymphs Abs 05/21/2018 3.5* 0.9 - 3.3 K/uL Final  . Monocytes Relative 05/21/2018 14  % Final  . Monocytes Absolute 05/21/2018 2.9* 0.1 - 0.9 K/uL Final  . Eosinophils Relative 05/21/2018 0  % Final  . Eosinophils Absolute 05/21/2018 0.0  0.0 - 0.5 K/uL Final  . Basophils Relative 05/21/2018 0  % Final  . Basophils Absolute 05/21/2018 0.1  0.0 - 0.1 K/uL Final   Performed at [facility], [place] [address], [place], [place] [address]    (this displays the last labs from the last 3 days)  No results found for:  TOTALPROTELP, ALBUMINELP, A1GS, A2GS, BETS, BETA2SER, GAMS, MSPIKE, SPEI (this displays SPEP labs)  No results found for: KPAFRELGTCHN, LAMBDASER, KAPLAMBRATIO (kappa/lambda light chains)  No results found for: HGBA, HGBA2QUANT, HGBFQUANT, HGBSQUAN (  Hemoglobinopathy evaluation)   No results found for: LDH  No results found for: IRON, TIBC, IRONPCTSAT (Iron and TIBC)  No results found for: FERRITIN  Urinalysis No results found for: COLORURINE, APPEARANCEUR, LABSPEC, PHURINE, GLUCOSEU, HGBUR, BILIRUBINUR, KETONESUR, PROTEINUR, UROBILINOGEN, NITRITE, LEUKOCYTESUR   STUDIES: No results found.  ELIGIBLE FOR AVAILABLE RESEARCH PROTOCOL: not discussed  ASSESSMENT: 52 y.o. [place] woman status post left mastectomy and sentinel lymph node sampling 04/02/2018 for a pT2 pN1, stage IIB invasive ductal carcinoma, grade 3, estrogen and progesterone receptor negative, but HER-2 amplified  (1) genetics testing 06/15/2018  (2) adjuvant chemotherapy consisting of carboplatin and docetaxel given every 21 days x 6, started 05/13/2018  (3) trastuzumab and Pertuzumab will started 05/13/2018 with chemotherapy and continue for 6 months  (a) echocardiogram 05/06/2018 shows an ejection fraction in the 65-70%  (4) adjuvant radiation to follow  (5) referral to plastics placed 05/03/2018    PLAN: Monica Padilla tolerated her first cycle of chemo immunotherapy wonderfully well.  The only side effects she actually experienced were mild fatigue, which did not keep her from working daily, and mild to moderate moderate diarrhea, which she was able to control with Imodium.  She was also able to keep herself well-hydrated.  She is just beginning to have taste alteration.  She is also having some scalp changes and is likely to lose her hair in the next week.  Overall however I am not going to make any changes in her treatment.  She will return 06/03/2018 for cycle 2, and then she will see [place] a week later again  to troubleshoot side effects.  That will be the pattern of until she completes her chemotherapy  I encouraged her to call us with any other issues that may develop and particularly if she feels she is becoming dehydrated.   Jaber Dunlow, Monica Dad, MD  05/21/18 12:32 PM Medical Oncology and Hematology [facility] [address] [place], [place] [address] Tel. [phone]    Fax. [phone]  This document serves as a record of services personally performed by Chauncey Cruel, MD. It was created on his behalf by Margit Banda, a trained medical scribe. The creation of this record is based on the scribe's personal observations and the provider's statements to them.   I have reviewed the above documentation for accuracy and completeness, and I agree with the above.

## 2018-05-21 ENCOUNTER — Inpatient Hospital Stay (HOSPITAL_BASED_OUTPATIENT_CLINIC_OR_DEPARTMENT_OTHER): Payer: BC Managed Care – PPO | Admitting: Oncology

## 2018-05-21 ENCOUNTER — Inpatient Hospital Stay: Payer: BC Managed Care – PPO

## 2018-05-21 VITALS — BP 125/83 | HR 94 | Temp 98.4°F | Resp 18 | Ht 61.0 in | Wt 124.1 lb

## 2018-05-21 DIAGNOSIS — C50512 Malignant neoplasm of lower-outer quadrant of left female breast: Secondary | ICD-10-CM

## 2018-05-21 DIAGNOSIS — Z9012 Acquired absence of left breast and nipple: Secondary | ICD-10-CM

## 2018-05-21 DIAGNOSIS — Z85828 Personal history of other malignant neoplasm of skin: Secondary | ICD-10-CM

## 2018-05-21 DIAGNOSIS — Z5111 Encounter for antineoplastic chemotherapy: Secondary | ICD-10-CM | POA: Diagnosis not present

## 2018-05-21 DIAGNOSIS — Z171 Estrogen receptor negative status [ER-]: Secondary | ICD-10-CM | POA: Diagnosis not present

## 2018-05-21 LAB — COMPREHENSIVE METABOLIC PANEL
ALBUMIN: 3.8 g/dL (ref 3.5–5.0)
ALT: 24 U/L (ref 0–55)
AST: 18 U/L (ref 5–34)
Alkaline Phosphatase: 119 U/L (ref 40–150)
Anion gap: 9 (ref 3–11)
BUN: 10 mg/dL (ref 7–26)
CHLORIDE: 103 mmol/L (ref 98–109)
CO2: 26 mmol/L (ref 22–29)
Calcium: 9.6 mg/dL (ref 8.4–10.4)
Creatinine, Ser: 0.73 mg/dL (ref 0.60–1.10)
GFR calc Af Amer: >60 mL/min (ref >60)
GFR calc non Af Amer: >60 mL/min (ref >60)
Glucose, Bld: 91 mg/dL (ref 70–140)
POTASSIUM: 4.4 mmol/L (ref 3.5–5.1)
SODIUM: 138 mmol/L (ref 136–145)
Total Protein: 6.6 g/dL (ref 6.4–8.3)

## 2018-05-21 LAB — CBC WITH DIFFERENTIAL/PLATELET
Basophils Absolute: 0.1 10*3/uL (ref 0.0–0.1)
Basophils Relative: 0 %
EOS PCT: 0 %
Eosinophils Absolute: 0 10*3/uL (ref 0.0–0.5)
HCT: 35.7 % (ref 34.8–46.6)
Hemoglobin: 11.7 g/dL (ref 11.6–15.9)
LYMPHS ABS: 3.5 10*3/uL — AB (ref 0.9–3.3)
LYMPHS PCT: 17 %
MCH: 30.3 pg (ref 25.1–34.0)
MCHC: 32.8 g/dL (ref 31.5–36.0)
MCV: 92.5 fL (ref 79.5–101.0)
MONO ABS: 2.9 10*3/uL — AB (ref 0.1–0.9)
Monocytes Relative: 14 %
Neutro Abs: 14.7 10*3/uL — ABNORMAL HIGH (ref 1.5–6.5)
Neutrophils Relative %: 69 %
PLATELETS: 137 10*3/uL — AB (ref 145–400)
RBC: 3.86 MIL/uL (ref 3.70–5.45)
RDW: 12.9 % (ref 11.2–14.5)
WBC: 21.1 10*3/uL — ABNORMAL HIGH (ref 3.9–10.3)

## 2018-05-26 ENCOUNTER — Inpatient Hospital Stay (HOSPITAL_BASED_OUTPATIENT_CLINIC_OR_DEPARTMENT_OTHER): Payer: BC Managed Care – PPO | Admitting: Medical

## 2018-05-26 VITALS — BP 144/87 | HR 80 | Temp 98.3°F | Resp 18 | Ht 61.0 in | Wt 128.2 lb

## 2018-05-26 DIAGNOSIS — Z171 Estrogen receptor negative status [ER-]: Secondary | ICD-10-CM | POA: Diagnosis not present

## 2018-05-26 DIAGNOSIS — T451X5A Adverse effect of antineoplastic and immunosuppressive drugs, initial encounter: Secondary | ICD-10-CM | POA: Diagnosis not present

## 2018-05-26 DIAGNOSIS — R21 Rash and other nonspecific skin eruption: Secondary | ICD-10-CM

## 2018-05-26 DIAGNOSIS — C50512 Malignant neoplasm of lower-outer quadrant of left female breast: Secondary | ICD-10-CM | POA: Diagnosis not present

## 2018-05-26 DIAGNOSIS — Z5111 Encounter for antineoplastic chemotherapy: Secondary | ICD-10-CM | POA: Diagnosis not present

## 2018-05-26 MED ORDER — PREDNISONE 5 MG PO TABS: ORAL_TABLET | ORAL | 0 refills | Status: DC

## 2018-05-26 MED ORDER — TRIAMCINOLONE ACETONIDE 0.1 % EX LOTN: 1.0000 "application " | TOPICAL_LOTION | Freq: Three times a day (TID) | CUTANEOUS | 1 refills | Status: DC

## 2018-05-26 NOTE — Patient Instructions (Signed)

## 2018-05-27 NOTE — Progress Notes (Signed)
[facility] Progress Note   Monica Padilla [id number] November 02, 1966 52 y.o.  Monica Padilla is managed by Dr. 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  Please see After Visit Summary for patient specific instructions.  Future Appointments  Date Time Provider Nelson  06/03/2018  9:30 AM [facility] LAB 6 [facility] None  06/03/2018  9:45 AM [facility] INJ NURSE [facility] None  06/03/2018 10:45 AM [facility] D11 [facility] None  06/05/2018  1:45 PM [facility] INJ NURSE [facility] None  06/10/2018  1:45 PM [facility] LAB 6 [facility] None  06/10/2018  2:00 PM [facility] FLUSH NURSE 2 [facility] None  06/10/2018  2:30 PM Causey, Charlestine Massed, NP [facility] None  06/15/2018 10:00 AM Ferol Luz J [facility] None  06/15/2018 11:00 AM [facility] LAB 2 [facility] None  06/15/2018 11:15 AM [facility] LAB 6 [facility] None  06/24/2018  9:30 AM [facility] LAB 6 [facility] None  06/24/2018  9:45 AM [facility] INJ NURSE [facility] None  06/24/2018 10:45 AM [facility] A1 [facility] None  06/26/2018  1:45 PM [facility] INJ NURSE [facility] None  06/30/2018 12:00 PM [facility] LAB 1 [facility] None  06/30/2018 12:30 PM Magrinat, Virgie Dad, MD [facility] None  07/15/2018  9:30 AM [facility] LAB 5 [facility] None  07/15/2018  9:45 AM [facility] J32 DNS [facility] None  07/15/2018 10:45 AM [facility] A1 [facility] None  07/17/2018  1:45 PM [facility] INJ NURSE [facility] None  08/05/2018  9:30 AM [facility] LAB 4 [facility] None  08/05/2018  9:45 AM [facility] FLUSH NURSE 2 [facility] None    08/05/2018 10:45 AM [facility] B7 [facility] None  08/07/2018  1:45 PM [facility] INJ NURSE [facility] None  08/26/2018  9:30 AM [facility] ONLY [facility] None  08/26/2018  9:45 AM [facility] INJ NURSE [facility] None  08/26/2018 10:45 AM [facility] B7 [facility] None  08/28/2018  1:45 PM [facility] INJ NURSE [facility] None  09/16/2018  9:30 AM [facility] LAB 6 [facility] None  09/16/2018  9:45 AM [facility] INJ NURSE [facility] None  09/16/2018 10:45 AM [facility] B7 [facility] None  09/18/2018  1:45 PM [facility] INJ NURSE [facility] None  10/07/2018  9:30 AM [facility] LAB 5 [facility] None  10/07/2018  9:45 AM [facility] FLUSH NURSE 2 [facility] None  10/07/2018 10:45 AM [facility] B7 [facility] None  10/09/2018  1:45 PM [facility] INJ NURSE [facility] None    No orders of the defined types were placed in this encounter.      Subjective:   Patient ID:  Monica Padilla is a 52 y.o. (DOB April 17, 1966) female.  Chief Complaint: No chief complaint on file.   HPI Monica Padilla is a 52 year old female with a history of an ER negative, HER-2/neu positive left breast cancer who is managed by Dr. Jana Hakim and has most recently been treated with cycle 1, day 1 of carboplatin, paclitaxel, pertuzuman and trastuzumab last dosed on 05/13/2018.  She presents to the office today with a rash over her face, neck, chest, and arms since her treatment.   The rash is not painful and does not itch.  She denies fevers, chills, sweats, chest tightness, chest pain, or difficulty swallowing.  Medications: I have reviewed the patient's current medications.  Allergies:  Allergies  Allergen Reactions  . Codeine Itching  . Nitrofurantoin     REACTION: pruritis  . Amoxicillin Rash    Past Medical History:  Diagnosis Date  . Cancer    basal cell CA- chest, leg  . GERD (gastroesophageal reflux disease)     Past Surgical History:  Procedure Laterality Date  . PARTIAL  HYSTERECTOMY    . SKIN CANCER EXCISION      Family History  Problem Relation Age of Onset  . Colon cancer Father   . Esophageal cancer Neg Hx   . Stomach cancer Neg Hx   . Rectal cancer Neg Hx     Social History   Socioeconomic History  . Marital status: Married    Spouse name: Not on file  . Number of children: Not on file  . Years of education: Not on file  . Highest education level: Not on file  Occupational History  . Not on file  Social Needs  . Financial resource strain: Not on file  . Food insecurity:    Worry: Not on file    Inability: Not on file  . Transportation needs:    Medical: Not on file    Non-medical: Not on file  Tobacco Use  . Smoking status: Never Smoker  . Smokeless tobacco: Never Used  Substance and Sexual Activity  . Alcohol use: Yes    Comment: occasional  . Drug use: No  . Sexual activity: Not on file  Lifestyle  . Physical activity:    Days per week: Not on file    Minutes per session: Not on file  . Stress: Not on file  Relationships  . Social connections:    Talks on phone: Not on file    Gets together: Not on file    Attends religious service: Not on file    Active member of club or organization: Not on file    Attends meetings of clubs or organizations: Not on file    Relationship status: Not on file  . Intimate partner violence:    Fear of current or ex partner: Not on file    Emotionally abused: Not on file    Physically abused: Not on file    Forced sexual activity: Not on file  Other Topics Concern  . Not on file  Social History Narrative  . Not on file    Past Medical History, Surgical history, Social history, and Family history were reviewed and updated as appropriate.   Please see review of systems for further details on the patient's review from today.   Review of Systems:  Review of Systems  Constitutional: Negative for diaphoresis.  HENT: Negative for trouble swallowing.   Respiratory: Negative for cough,  choking, chest tightness, shortness of breath and wheezing.   Cardiovascular: Negative for chest pain and palpitations.  Gastrointestinal: Negative for nausea and vomiting.  Musculoskeletal: Negative for back pain.  Skin: Positive for rash. Negative for color change.  Neurological: Negative for dizziness, light-headedness and headaches.    Objective:   Physical Exam:  BP (!) 144/87 (BP Location: Right Arm, Patient Position: Sitting)   Pulse 80   Temp 98.3 F (36.8 C) (Oral)   Resp 18   Ht '5\' 1"'  (1.549 m)   Wt 128 lb 3.2 oz (58.2 kg)   SpO2 100%   BMI 24.22 kg/m  ECOG: 0  Physical Exam  Constitutional: No distress.  HENT:  Head: Normocephalic and atraumatic.  Cardiovascular: Normal rate, regular rhythm and  normal heart sounds. Exam reveals no gallop and no friction rub.  No murmur heard. Pulmonary/Chest: Effort normal and breath sounds normal. No respiratory distress. She has no wheezes. She has no rales.  Neurological: She is alert.  Skin: Skin is warm and dry. Rash noted. She is not diaphoretic. There is erythema.  A diffuse erythematous rash over her face, chest, and upper extremities.       Lab Review:     Component Value Date/Time   NA 138 05/21/2018 1159   K 4.4 05/21/2018 1159   CL 103 05/21/2018 1159   CO2 26 05/21/2018 1159   GLUCOSE 91 05/21/2018 1159   BUN 10 05/21/2018 1159   CREATININE 0.73 05/21/2018 1159   CALCIUM 9.6 05/21/2018 1159   PROT 6.6 05/21/2018 1159   ALBUMIN 3.8 05/21/2018 1159   AST 18 05/21/2018 1159   ALT 24 05/21/2018 1159   ALKPHOS 119 05/21/2018 1159   BILITOT <0.2 (L) 05/21/2018 1159   GFRNONAA >60 05/21/2018 1159   GFRAA >60 05/21/2018 1159       Component Value Date/Time   WBC 21.1 (H) 05/21/2018 1159   RBC 3.86 05/21/2018 1159   HGB 11.7 05/21/2018 1159   HCT 35.7 05/21/2018 1159   PLT 137 (L) 05/21/2018 1159   MCV 92.5 05/21/2018 1159   MCH 30.3 05/21/2018 1159   MCHC 32.8 05/21/2018 1159   RDW 12.9 05/21/2018  1159   LYMPHSABS 3.5 (H) 05/21/2018 1159   MONOABS 2.9 (H) 05/21/2018 1159   EOSABS 0.0 05/21/2018 1159   BASOSABS 0.1 05/21/2018 1159   -------------------------------  Imaging from last 24 hours (if applicable):  Radiology interpretation: No results found.

## 2018-06-01 ENCOUNTER — Other Ambulatory Visit: Payer: Self-pay | Admitting: Oncology

## 2018-06-03 ENCOUNTER — Inpatient Hospital Stay: Payer: BC Managed Care – PPO | Attending: Oncology

## 2018-06-03 ENCOUNTER — Inpatient Hospital Stay: Payer: BC Managed Care – PPO

## 2018-06-03 ENCOUNTER — Other Ambulatory Visit: Payer: BC Managed Care – PPO

## 2018-06-03 VITALS — BP 130/84 | HR 68 | Temp 97.9°F | Resp 18

## 2018-06-03 DIAGNOSIS — Z800 Family history of malignant neoplasm of digestive organs: Secondary | ICD-10-CM | POA: Insufficient documentation

## 2018-06-03 DIAGNOSIS — T50905A Adverse effect of unspecified drugs, medicaments and biological substances, initial encounter: Secondary | ICD-10-CM | POA: Insufficient documentation

## 2018-06-03 DIAGNOSIS — L270 Generalized skin eruption due to drugs and medicaments taken internally: Secondary | ICD-10-CM | POA: Diagnosis not present

## 2018-06-03 DIAGNOSIS — Z171 Estrogen receptor negative status [ER-]: Secondary | ICD-10-CM | POA: Diagnosis not present

## 2018-06-03 DIAGNOSIS — C50512 Malignant neoplasm of lower-outer quadrant of left female breast: Secondary | ICD-10-CM | POA: Diagnosis present

## 2018-06-03 DIAGNOSIS — Z5111 Encounter for antineoplastic chemotherapy: Secondary | ICD-10-CM | POA: Insufficient documentation

## 2018-06-03 DIAGNOSIS — Z803 Family history of malignant neoplasm of breast: Secondary | ICD-10-CM | POA: Insufficient documentation

## 2018-06-03 DIAGNOSIS — Z5112 Encounter for antineoplastic immunotherapy: Secondary | ICD-10-CM | POA: Diagnosis present

## 2018-06-03 DIAGNOSIS — Z9012 Acquired absence of left breast and nipple: Secondary | ICD-10-CM | POA: Diagnosis not present

## 2018-06-03 DIAGNOSIS — Z801 Family history of malignant neoplasm of trachea, bronchus and lung: Secondary | ICD-10-CM | POA: Diagnosis not present

## 2018-06-03 DIAGNOSIS — Z5189 Encounter for other specified aftercare: Secondary | ICD-10-CM | POA: Insufficient documentation

## 2018-06-03 LAB — COMPREHENSIVE METABOLIC PANEL
ALT: 27 U/L (ref 0–55)
AST: 18 U/L (ref 5–34)
Albumin: 4.3 g/dL (ref 3.5–5.0)
Alkaline Phosphatase: 100 U/L (ref 40–150)
Anion gap: 9 (ref 3–11)
BUN: 13 mg/dL (ref 7–26)
CHLORIDE: 104 mmol/L (ref 98–109)
CO2: 25 mmol/L (ref 22–29)
CREATININE: 0.71 mg/dL (ref 0.60–1.10)
Calcium: 10.4 mg/dL (ref 8.4–10.4)
GFR calc Af Amer: >60 mL/min (ref >60)
GFR calc non Af Amer: >60 mL/min (ref >60)
Glucose, Bld: 114 mg/dL (ref 70–140)
POTASSIUM: 3.7 mmol/L (ref 3.5–5.1)
SODIUM: 138 mmol/L (ref 136–145)
Total Bilirubin: 0.4 mg/dL (ref 0.2–1.2)
Total Protein: 7.2 g/dL (ref 6.4–8.3)

## 2018-06-03 LAB — CBC WITH DIFFERENTIAL/PLATELET
Basophils Absolute: 0 10*3/uL (ref 0.0–0.1)
Basophils Relative: 0 %
EOS ABS: 0 10*3/uL (ref 0.0–0.5)
Eosinophils Relative: 0 %
HEMATOCRIT: 34.4 % — AB (ref 34.8–46.6)
HEMOGLOBIN: 11.5 g/dL — AB (ref 11.6–15.9)
LYMPHS ABS: 1 10*3/uL (ref 0.9–3.3)
LYMPHS PCT: 10 %
MCH: 30.3 pg (ref 25.1–34.0)
MCHC: 33.4 g/dL (ref 31.5–36.0)
MCV: 90.5 fL (ref 79.5–101.0)
MONOS PCT: 8 %
Monocytes Absolute: 0.8 10*3/uL (ref 0.1–0.9)
NEUTROS ABS: 7.9 10*3/uL — AB (ref 1.5–6.5)
Neutrophils Relative %: 82 %
Platelets: 312 10*3/uL (ref 145–400)
RBC: 3.8 MIL/uL (ref 3.70–5.45)
RDW: 13.4 % (ref 11.2–14.5)
WBC: 9.6 10*3/uL (ref 3.9–10.3)

## 2018-06-03 MED ORDER — PALONOSETRON HCL INJECTION 0.25 MG/5ML
0.2500 mg | Freq: Once | INTRAVENOUS | Status: AC
  Administered 2018-06-03: 0.25 mg via INTRAVENOUS

## 2018-06-03 MED ORDER — DIPHENHYDRAMINE HCL 25 MG PO CAPS
ORAL_CAPSULE | ORAL | Status: AC
  Filled 2018-06-03: qty 2

## 2018-06-03 MED ORDER — SODIUM CHLORIDE 0.9 % IV SOLN
420.0000 mg | Freq: Once | INTRAVENOUS | Status: AC
  Administered 2018-06-03: 420 mg via INTRAVENOUS
  Filled 2018-06-03: qty 14

## 2018-06-03 MED ORDER — CARBOPLATIN CHEMO INJECTION 600 MG/60ML
500.0000 mg | Freq: Once | INTRAVENOUS | Status: AC
  Administered 2018-06-03: 500 mg via INTRAVENOUS
  Filled 2018-06-03: qty 50

## 2018-06-03 MED ORDER — ACETAMINOPHEN 325 MG PO TABS
650.0000 mg | ORAL_TABLET | Freq: Once | ORAL | Status: AC
  Administered 2018-06-03: 650 mg via ORAL

## 2018-06-03 MED ORDER — SODIUM CHLORIDE 0.9% FLUSH
10.0000 mL | INTRAVENOUS | Status: DC | PRN
  Administered 2018-06-03: 10 mL
  Filled 2018-06-03: qty 10

## 2018-06-03 MED ORDER — HEPARIN SOD (PORK) LOCK FLUSH 100 UNIT/ML IV SOLN
500.0000 [IU] | Freq: Once | INTRAVENOUS | Status: AC | PRN
  Administered 2018-06-03: 500 [IU]
  Filled 2018-06-03: qty 5

## 2018-06-03 MED ORDER — SODIUM CHLORIDE 0.9 % IV SOLN
Freq: Once | INTRAVENOUS | Status: AC
  Administered 2018-06-03: 11:00:00 via INTRAVENOUS

## 2018-06-03 MED ORDER — SODIUM CHLORIDE 0.9 % IV SOLN
75.0000 mg/m2 | Freq: Once | INTRAVENOUS | Status: AC
  Administered 2018-06-03: 120 mg via INTRAVENOUS
  Filled 2018-06-03: qty 12

## 2018-06-03 MED ORDER — DIPHENHYDRAMINE HCL 25 MG PO CAPS
50.0000 mg | ORAL_CAPSULE | Freq: Once | ORAL | Status: AC
  Administered 2018-06-03: 50 mg via ORAL

## 2018-06-03 MED ORDER — ACETAMINOPHEN 325 MG PO TABS
ORAL_TABLET | ORAL | Status: AC
  Filled 2018-06-03: qty 2

## 2018-06-03 MED ORDER — PALONOSETRON HCL INJECTION 0.25 MG/5ML
INTRAVENOUS | Status: AC
  Filled 2018-06-03: qty 5

## 2018-06-03 MED ORDER — SODIUM CHLORIDE 0.9 % IV SOLN
Freq: Once | INTRAVENOUS | Status: AC
  Administered 2018-06-03: 11:00:00 via INTRAVENOUS
  Filled 2018-06-03: qty 5

## 2018-06-03 MED ORDER — TRASTUZUMAB CHEMO 150 MG IV SOLR
6.0000 mg/kg | Freq: Once | INTRAVENOUS | Status: AC
  Administered 2018-06-03: 336 mg via INTRAVENOUS
  Filled 2018-06-03: qty 16

## 2018-06-03 NOTE — Patient Instructions (Signed)
[facility] Discharge Instructions for Patients Receiving Chemotherapy  Today you received the following chemotherapy agents: Herceptin, Perjeta, Taxotere, Carboplatin    To help prevent nausea and vomiting after your treatment, we encourage you to take your nausea medication as prescribed.   If you develop nausea and vomiting that is not controlled by your nausea medication, call the clinic.   BELOW ARE SYMPTOMS THAT SHOULD BE REPORTED IMMEDIATELY:  *FEVER GREATER THAN 100.5 F  *CHILLS WITH OR WITHOUT FEVER  NAUSEA AND VOMITING THAT IS NOT CONTROLLED WITH YOUR NAUSEA MEDICATION  *UNUSUAL SHORTNESS OF BREATH  *UNUSUAL BRUISING OR BLEEDING  TENDERNESS IN MOUTH AND THROAT WITH OR WITHOUT PRESENCE OF ULCERS  *URINARY PROBLEMS  *BOWEL PROBLEMS  UNUSUAL RASH Items with * indicate a potential emergency and should be followed up as soon as possible.  Feel free to call the clinic should you have any questions or concerns. The clinic phone number is [phone].  Please show the [facility] at check-in to the Emergency Department and triage nurse.  Trastuzumab injection for infusion What is this medicine? TRASTUZUMAB (tras TOO zoo mab) is a monoclonal antibody. It is used to treat breast cancer and stomach cancer. This medicine may be used for other purposes; ask your health care provider or pharmacist if you have questions. COMMON BRAND NAME(S): Herceptin What should I tell my health care provider before I take this medicine? They need to know if you have any of these conditions: -heart disease -heart failure -lung or breathing disease, like asthma -an unusual or allergic reaction to trastuzumab, benzyl alcohol, or other medications, foods, dyes, or preservatives -pregnant or trying to get pregnant -breast-feeding How should I use this medicine? This drug is given as an infusion into a vein. It is administered in a hospital or clinic by a specially  trained health care professional. Talk to your pediatrician regarding the use of this medicine in children. This medicine is not approved for use in children. Overdosage: If you think you have taken too much of this medicine contact a poison control center or emergency room at once. NOTE: This medicine is only for you. Do not share this medicine with others. What if I miss a dose? It is important not to miss a dose. Call your doctor or health care professional if you are unable to keep an appointment. What may interact with this medicine? This medicine may interact with the following medications: -certain types of chemotherapy, such as daunorubicin, doxorubicin, epirubicin, and idarubicin This list may not describe all possible interactions. Give your health care provider a list of all the medicines, herbs, non-prescription drugs, or dietary supplements you use. Also tell them if you smoke, drink alcohol, or use illegal drugs. Some items may interact with your medicine. What should I watch for while using this medicine? Visit your doctor for checks on your progress. Report any side effects. Continue your course of treatment even though you feel ill unless your doctor tells you to stop. Call your doctor or health care professional for advice if you get a fever, chills or sore throat, or other symptoms of a cold or flu. Do not treat yourself. Try to avoid being around people who are sick. You may experience fever, chills and shaking during your first infusion. These effects are usually mild and can be treated with other medicines. Report any side effects during the infusion to your health care professional. Fever and chills usually do not happen with later infusions.  Do not become pregnant while taking this medicine or for 7 months after stopping it. Women should inform their doctor if they wish to become pregnant or think they might be pregnant. Women of child-bearing potential will need to have a  negative pregnancy test before starting this medicine. There is a potential for serious side effects to an unborn child. Talk to your health care professional or pharmacist for more information. Do not breast-feed an infant while taking this medicine or for 7 months after stopping it. Women must use effective birth control with this medicine. What side effects may I notice from receiving this medicine? Side effects that you should report to your doctor or health care professional as soon as possible: -allergic reactions like skin rash, itching or hives, swelling of the face, lips, or tongue -chest pain or palpitations -cough -dizziness -feeling faint or lightheaded, falls -fever -general ill feeling or flu-like symptoms -signs of worsening heart failure like breathing problems; swelling in your legs and feet -unusually weak or tired Side effects that usually do not require medical attention (report to your doctor or health care professional if they continue or are bothersome): -bone pain -changes in taste -diarrhea -joint pain -nausea/vomiting -weight loss This list may not describe all possible side effects. Call your doctor for medical advice about side effects. You may report side effects to FDA at 1-800-FDA-1088. Where should I keep my medicine? This drug is given in a hospital or clinic and will not be stored at home. NOTE: This sheet is a summary. It may not cover all possible information. If you have questions about this medicine, talk to your doctor, pharmacist, or health care provider.  2018 Elsevier/Gold Standard (2016-12-09 14:37:52) Pertuzumab injection What is this medicine? PERTUZUMAB (per TOOZ ue mab) is a monoclonal antibody. It is used to treat breast cancer. This medicine may be used for other purposes; ask your health care provider or pharmacist if you have questions. COMMON BRAND NAME(S): PERJETA What should I tell my health care provider before I take this  medicine? They need to know if you have any of these conditions: -heart disease -heart failure -high blood pressure -history of irregular heart beat -recent or ongoing radiation therapy -an unusual or allergic reaction to pertuzumab, other medicines, foods, dyes, or preservatives -pregnant or trying to get pregnant -breast-feeding How should I use this medicine? This medicine is for infusion into a vein. It is given by a health care professional in a hospital or clinic setting. Talk to your pediatrician regarding the use of this medicine in children. Special care may be needed. Overdosage: If you think you have taken too much of this medicine contact a poison control center or emergency room at once. NOTE: This medicine is only for you. Do not share this medicine with others. What if I miss a dose? It is important not to miss your dose. Call your doctor or health care professional if you are unable to keep an appointment. What may interact with this medicine? Interactions are not expected. Give your health care provider a list of all the medicines, herbs, non-prescription drugs, or dietary supplements you use. Also tell them if you smoke, drink alcohol, or use illegal drugs. Some items may interact with your medicine. This list may not describe all possible interactions. Give your health care provider a list of all the medicines, herbs, non-prescription drugs, or dietary supplements you use. Also tell them if you smoke, drink alcohol, or use illegal drugs. Some items may  interact with your medicine. What should I watch for while using this medicine? Your condition will be monitored carefully while you are receiving this medicine. Report any side effects. Continue your course of treatment even though you feel ill unless your doctor tells you to stop. Do not become pregnant while taking this medicine or for 7 months after stopping it. Women should inform their doctor if they wish to become  pregnant or think they might be pregnant. Women of child-bearing potential will need to have a negative pregnancy test before starting this medicine. There is a potential for serious side effects to an unborn child. Talk to your health care professional or pharmacist for more information. Do not breast-feed an infant while taking this medicine or for 7 months after stopping it. Women must use effective birth control with this medicine. Call your doctor or health care professional for advice if you get a fever, chills or sore throat, or other symptoms of a cold or flu. Do not treat yourself. Try to avoid being around people who are sick. You may experience fever, chills, and headache during the infusion. Report any side effects during the infusion to your health care professional. What side effects may I notice from receiving this medicine? Side effects that you should report to your doctor or health care professional as soon as possible: -breathing problems -chest pain or palpitations -dizziness -feeling faint or lightheaded -fever or chills -skin rash, itching or hives -sore throat -swelling of the face, lips, or tongue -swelling of the legs or ankles -unusually weak or tired Side effects that usually do not require medical attention (report to your doctor or health care professional if they continue or are bothersome): -diarrhea -hair loss -nausea, vomiting -tiredness This list may not describe all possible side effects. Call your doctor for medical advice about side effects. You may report side effects to FDA at 1-800-FDA-1088. Where should I keep my medicine? This drug is given in a hospital or clinic and will not be stored at home. NOTE: This sheet is a summary. It may not cover all possible information. If you have questions about this medicine, talk to your doctor, pharmacist, or health care provider.  2018 Elsevier/Gold Standard (2016-01-17 12:08:50) Docetaxel injection What is  this medicine? DOCETAXEL (doe se TAX el) is a chemotherapy drug. It targets fast dividing cells, like cancer cells, and causes these cells to die. This medicine is used to treat many types of cancers like breast cancer, certain stomach cancers, head and neck cancer, lung cancer, and prostate cancer. This medicine may be used for other purposes; ask your health care provider or pharmacist if you have questions. COMMON BRAND NAME(S): Docefrez, Taxotere What should I tell my health care provider before I take this medicine? They need to know if you have any of these conditions: -infection (especially a virus infection such as chickenpox, cold sores, or herpes) -liver disease -low blood counts, like low white cell, platelet, or red cell counts -an unusual or allergic reaction to docetaxel, polysorbate 80, other chemotherapy agents, other medicines, foods, dyes, or preservatives -pregnant or trying to get pregnant -breast-feeding How should I use this medicine? This drug is given as an infusion into a vein. It is administered in a hospital or clinic by a specially trained health care professional. Talk to your pediatrician regarding the use of this medicine in children. Special care may be needed. Overdosage: If you think you have taken too much of this medicine contact a poison control  center or emergency room at once. NOTE: This medicine is only for you. Do not share this medicine with others. What if I miss a dose? It is important not to miss your dose. Call your doctor or health care professional if you are unable to keep an appointment. What may interact with this medicine? -cyclosporine -erythromycin -ketoconazole -medicines to increase blood counts like filgrastim, pegfilgrastim, sargramostim -vaccines Talk to your doctor or health care professional before taking any of these medicines: -acetaminophen -aspirin -ibuprofen -ketoprofen -naproxen This list may not describe all possible  interactions. Give your health care provider a list of all the medicines, herbs, non-prescription drugs, or dietary supplements you use. Also tell them if you smoke, drink alcohol, or use illegal drugs. Some items may interact with your medicine. What should I watch for while using this medicine? Your condition will be monitored carefully while you are receiving this medicine. You will need important blood work done while you are taking this medicine. This drug may make you feel generally unwell. This is not uncommon, as chemotherapy can affect healthy cells as well as cancer cells. Report any side effects. Continue your course of treatment even though you feel ill unless your doctor tells you to stop. In some cases, you may be given additional medicines to help with side effects. Follow all directions for their use. Call your doctor or health care professional for advice if you get a fever, chills or sore throat, or other symptoms of a cold or flu. Do not treat yourself. This drug decreases your body's ability to fight infections. Try to avoid being around people who are sick. This medicine may increase your risk to bruise or bleed. Call your doctor or health care professional if you notice any unusual bleeding. This medicine may contain alcohol in the product. You may get drowsy or dizzy. Do not drive, use machinery, or do anything that needs mental alertness until you know how this medicine affects you. Do not stand or sit up quickly, especially if you are an older patient. This reduces the risk of dizzy or fainting spells. Avoid alcoholic drinks. Do not become pregnant while taking this medicine. Women should inform their doctor if they wish to become pregnant or think they might be pregnant. There is a potential for serious side effects to an unborn child. Talk to your health care professional or pharmacist for more information. Do not breast-feed an infant while taking this medicine. What side effects  may I notice from receiving this medicine? Side effects that you should report to your doctor or health care professional as soon as possible: -allergic reactions like skin rash, itching or hives, swelling of the face, lips, or tongue -low blood counts - This drug may decrease the number of white blood cells, red blood cells and platelets. You may be at increased risk for infections and bleeding. -signs of infection - fever or chills, cough, sore throat, pain or difficulty passing urine -signs of decreased platelets or bleeding - bruising, pinpoint red spots on the skin, black, tarry stools, nosebleeds -signs of decreased red blood cells - unusually weak or tired, fainting spells, lightheadedness -breathing problems -fast or irregular heartbeat -low blood pressure -mouth sores -nausea and vomiting -pain, swelling, redness or irritation at the injection site -pain, tingling, numbness in the hands or feet -swelling of the ankle, feet, hands -weight gain Side effects that usually do not require medical attention (report to your doctor or health care professional if they continue or are  bothersome): -bone pain -complete hair loss including hair on your head, underarms, pubic hair, eyebrows, and eyelashes -diarrhea -excessive tearing -changes in the color of fingernails -loosening of the fingernails -nausea -muscle pain -red flush to skin -sweating -weak or tired This list may not describe all possible side effects. Call your doctor for medical advice about side effects. You may report side effects to FDA at 1-800-FDA-1088. Where should I keep my medicine? This drug is given in a hospital or clinic and will not be stored at home. NOTE: This sheet is a summary. It may not cover all possible information. If you have questions about this medicine, talk to your doctor, pharmacist, or health care provider.  2018 Elsevier/Gold Standard (2016-01-17 12:32:56) Carboplatin injection What is this  medicine? CARBOPLATIN (KAR boe pla tin) is a chemotherapy drug. It targets fast dividing cells, like cancer cells, and causes these cells to die. This medicine is used to treat ovarian cancer and many other cancers. This medicine may be used for other purposes; ask your health care provider or pharmacist if you have questions. COMMON BRAND NAME(S): Paraplatin What should I tell my health care provider before I take this medicine? They need to know if you have any of these conditions: -blood disorders -hearing problems -kidney disease -recent or ongoing radiation therapy -an unusual or allergic reaction to carboplatin, cisplatin, other chemotherapy, other medicines, foods, dyes, or preservatives -pregnant or trying to get pregnant -breast-feeding How should I use this medicine? This drug is usually given as an infusion into a vein. It is administered in a hospital or clinic by a specially trained health care professional. Talk to your pediatrician regarding the use of this medicine in children. Special care may be needed. Overdosage: If you think you have taken too much of this medicine contact a poison control center or emergency room at once. NOTE: This medicine is only for you. Do not share this medicine with others. What if I miss a dose? It is important not to miss a dose. Call your doctor or health care professional if you are unable to keep an appointment. What may interact with this medicine? -medicines for seizures -medicines to increase blood counts like filgrastim, pegfilgrastim, sargramostim -some antibiotics like amikacin, gentamicin, neomycin, streptomycin, tobramycin -vaccines Talk to your doctor or health care professional before taking any of these medicines: -acetaminophen -aspirin -ibuprofen -ketoprofen -naproxen This list may not describe all possible interactions. Give your health care provider a list of all the medicines, herbs, non-prescription drugs, or dietary  supplements you use. Also tell them if you smoke, drink alcohol, or use illegal drugs. Some items may interact with your medicine. What should I watch for while using this medicine? Your condition will be monitored carefully while you are receiving this medicine. You will need important blood work done while you are taking this medicine. This drug may make you feel generally unwell. This is not uncommon, as chemotherapy can affect healthy cells as well as cancer cells. Report any side effects. Continue your course of treatment even though you feel ill unless your doctor tells you to stop. In some cases, you may be given additional medicines to help with side effects. Follow all directions for their use. Call your doctor or health care professional for advice if you get a fever, chills or sore throat, or other symptoms of a cold or flu. Do not treat yourself. This drug decreases your body's ability to fight infections. Try to avoid being around people who are  sick. This medicine may increase your risk to bruise or bleed. Call your doctor or health care professional if you notice any unusual bleeding. Be careful brushing and flossing your teeth or using a toothpick because you may get an infection or bleed more easily. If you have any dental work done, tell your dentist you are receiving this medicine. Avoid taking products that contain aspirin, acetaminophen, ibuprofen, naproxen, or ketoprofen unless instructed by your doctor. These medicines may hide a fever. Do not become pregnant while taking this medicine. Women should inform their doctor if they wish to become pregnant or think they might be pregnant. There is a potential for serious side effects to an unborn child. Talk to your health care professional or pharmacist for more information. Do not breast-feed an infant while taking this medicine. What side effects may I notice from receiving this medicine? Side effects that you should report to your  doctor or health care professional as soon as possible: -allergic reactions like skin rash, itching or hives, swelling of the face, lips, or tongue -signs of infection - fever or chills, cough, sore throat, pain or difficulty passing urine -signs of decreased platelets or bleeding - bruising, pinpoint red spots on the skin, black, tarry stools, nosebleeds -signs of decreased red blood cells - unusually weak or tired, fainting spells, lightheadedness -breathing problems -changes in hearing -changes in vision -chest pain -high blood pressure -low blood counts - This drug may decrease the number of white blood cells, red blood cells and platelets. You may be at increased risk for infections and bleeding. -nausea and vomiting -pain, swelling, redness or irritation at the injection site -pain, tingling, numbness in the hands or feet -problems with balance, talking, walking -trouble passing urine or change in the amount of urine Side effects that usually do not require medical attention (report to your doctor or health care professional if they continue or are bothersome): -hair loss -loss of appetite -metallic taste in the mouth or changes in taste This list may not describe all possible side effects. Call your doctor for medical advice about side effects. You may report side effects to FDA at 1-800-FDA-1088. Where should I keep my medicine? This drug is given in a hospital or clinic and will not be stored at home. NOTE: This sheet is a summary. It may not cover all possible information. If you have questions about this medicine, talk to your doctor, pharmacist, or health care provider.  2018 Elsevier/Gold Standard (2008-03-21 14:38:05)

## 2018-06-04 ENCOUNTER — Other Ambulatory Visit: Payer: Self-pay | Admitting: Oncology

## 2018-06-05 ENCOUNTER — Inpatient Hospital Stay: Payer: BC Managed Care – PPO

## 2018-06-05 VITALS — BP 132/84 | HR 61 | Temp 98.6°F | Resp 17

## 2018-06-05 DIAGNOSIS — Z5111 Encounter for antineoplastic chemotherapy: Secondary | ICD-10-CM | POA: Diagnosis not present

## 2018-06-05 DIAGNOSIS — Z171 Estrogen receptor negative status [ER-]: Principal | ICD-10-CM

## 2018-06-05 DIAGNOSIS — C50512 Malignant neoplasm of lower-outer quadrant of left female breast: Secondary | ICD-10-CM

## 2018-06-05 MED ORDER — PEGFILGRASTIM-CBQV 6 MG/0.6ML ~~LOC~~ SOSY
PREFILLED_SYRINGE | SUBCUTANEOUS | Status: AC
  Filled 2018-06-05: qty 0.6

## 2018-06-05 MED ORDER — PEGFILGRASTIM-CBQV 6 MG/0.6ML ~~LOC~~ SOSY
6.0000 mg | PREFILLED_SYRINGE | Freq: Once | SUBCUTANEOUS | Status: AC
  Administered 2018-06-05: 6 mg via SUBCUTANEOUS

## 2018-06-09 NOTE — Progress Notes (Deleted)
[place]  [phone]) [phone] Fax:([phone]) [phone]     ID: Monica Padilla DOB: 1966-07-09  MR#: [id number]  QGB#:[id number]  Patient Care Team: Algis Greenhouse, MD as PCP - General (Family Medicine) Signe Colt, MD as Referring Physician (Obstetrics and Gynecology) Magrinat, Virgie Dad, MD as Consulting Physician (Oncology) Larey Dresser, MD as Consulting Physician (Cardiology) Noberto Retort Juanda Bond., MD as Referring Physician (Surgery) Gatha Mayer, MD as Consulting Physician (Gastroenterology) Nolon Nations, MD as Consulting Physician (Diagnostic Radiology) OTHER MD: Dr. Jimmye Norman and Dr. 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 She brought this to medical attention immediately and had left diagnostic mammography with tomography at breast center  03/11/2018 showing a 2.5 cm palpable mass in the lower outer quadrant of the left breast, with one slightly prominent lymph node that had increased cortical measurement.  Biopsy of this left breast mass 03/17/2018 showed ([id number]), invasive ductal carcinoma, grade 2, estrogen and progesterone receptor negative, with an MIB-1 of 80%, but HER-2 amplified with a signals ratio 5.51 and the number per cell 17.35.  She underwent left mastectomy and sentinel lymph node sampling, as well as right-sided port placement 04/02/2018 under Etheleen Mayhew at [facility] for a 2.6 cm invasive ductal carcinoma, grade 3, with 1 of 3 sentinel nodes showing a micrometastatic deposit, but all margins negative.  She had a port placed at the same time.  The patient's subsequent history is as detailed below.  INTERVAL HISTORY: Monica Padilla returns today for a follow-up and treatment of her  estrogen receptor negative, HER-2 positive breast cancer. She is accompanied by her sister, Monica Padilla. She is being treated with carboplatin, docetaxel, trastuzumab and Pertuzumab.  Today is day 9 cycle 1 of 6 planned cycles repeated every 21 days.   Her echocardiogram on 05/06/2018 showed an excellent ejection fraction in the 65/70% range   REVIEW OF SYSTEMS: Monica Padilla   PAST MEDICAL HISTORY: Past Medical History:  Diagnosis Date  . Cancer    basal cell CA- chest, leg  . GERD (gastroesophageal reflux disease)   s.   PAST SURGICAL HISTORY: Past Surgical History:  Procedure Laterality Date  . PARTIAL HYSTERECTOMY    . SKIN CANCER EXCISION      FAMILY HISTORY Family History  Problem Relation Age of Onset  . Colon cancer Father   . Esophageal cancer Neg Hx   . Stomach cancer Neg Hx   . Rectal cancer Neg Hx    The patient's father died due to colon cancer at age 22. The patient's mother is alive at age 52. The patient has 1 sister and no brothers. A paternal cousin was diagnosed with breast cancer in the early 29's (before 26). The patient's paternal grandfather passed away from colon cancer. The patient's father had 3 brothers, one of whom had lung cancer.   GYNECOLOGIC HISTORY:  No LMP recorded. Patient has had a hysterectomy. Menarche: 52 years old Age at first live birth: 52 years old She is GXP2.  The patient is status post partial hysterectomy due to uterine fibroids. She retains her cervix and ovaries.  After her hysterectomy she used Estradiol with no complications.  She stopped hormone replacement March 2019.  She is now having hot flashes and nigh sweats that only allow her  to sleep 3 hours at a time.   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 Her husband, Monica Padilla, is a Hydrologist for alarms and cameras for [employer]. The patient's oldest son, Monica Padilla, is 52 and works as a Civil engineer, contracting in [place]. The patient's son,  Monica Padilla, is 71 an works for a Dispensing optician. [name] lives at home with the patient. Philis does not have any grandchildren. She attends [employer].      ADVANCED DIRECTIVES:    HEALTH MAINTENANCE: Social History   Tobacco Use  . Smoking status: Never Smoker  . Smokeless tobacco: Never Used  Substance Use Topics  . Alcohol use: Yes    Comment: occasional  . Drug use: No     Colonoscopy: 10/04/2013/ Dr. Carlean Purl / polyp removal  PAP:  Bone density: remote   Allergies  Allergen Reactions  . Codeine Itching  . Nitrofurantoin     REACTION: pruritis  . Amoxicillin Rash    Current Outpatient Medications  Medication Sig Dispense Refill  . Ascorbic Acid (VITAMIN C PO) Take 500 mg by mouth daily. With rose hips    . Cholecalciferol (VITAMIN D-1000 MAX ST) 1000 units tablet Take by mouth.    . CRANBERRY PO Take by mouth daily.    Marland Kitchen dexamethasone (DECADRON) 4 MG tablet Take 2 tablets (8 mg total) by mouth 2 (two) times daily. Start the day before Taxotere. Take once the day after, then 2 times a day x 2d. 30 tablet 1  . gabapentin (NEURONTIN) 300 MG capsule Take 1 capsule (300 mg total) by mouth at bedtime. 90 capsule 4  . lidocaine-prilocaine (EMLA) cream Apply to affected area once 30 g 3  . LORazepam (ATIVAN) 0.5 MG tablet Take 1 tablet (0.5 mg total) by mouth at bedtime as needed for anxiety. 30 tablet 1  . pantoprazole (PROTONIX) 40 MG tablet Take by mouth.    . predniSONE (DELTASONE) 5 MG tablet 6 tab x 1 day, 5 tab x 1 day, 4 tab x 1 day, 3 tab x 1 day, 2 tab x 1 day, 1 tab x 1 day, stop 21 tablet 0  . prochlorperazine (COMPAZINE) 10 MG tablet Take 1 tablet (10 mg total) by mouth every 6 (six) hours as needed (Nausea or vomiting). 30 tablet 1  . triamcinolone lotion (KENALOG) 0.1 % Apply 1 application topically 3 (three) times daily. 120 mL 1   No current facility-administered medications for this visit.     OBJECTIVE: Middle-aged white woman who appears  well  There were no vitals filed for this visit.   There is no height or weight on file to calculate BMI.   Wt Readings from Last 3 Encounters:  05/26/18 128 lb 3.2 oz (58.2 kg)  05/21/18 124 lb 1.6 oz (56.3 kg)  05/13/18 125 lb 4.8 oz (56.8 kg)      ECOG FS:0 - Asymptomatic  Sclerae unicteric, pupils round and equal Oropharynx clear and moist No cervical or supraclavicular adenopathy Lungs no rales or rhonchi Heart regular rate and rhythm Abd soft, nontender, positive bowel sounds MSK no focal spinal tenderness, no upper extremity lymphedema Neuro: nonfocal, well oriented, appropriate affect Breasts: The right breast is unremarkable.  The left breast is status post mastectomy.  The incision is healed nicely.  There is no evidence of local recurrence.  Both axillae are benign.  LAB RESULTS:  CMP     Component Value Date/Time   NA 138  06/03/2018 0928   K 3.7 06/03/2018 0928   CL 104 06/03/2018 0928   CO2 25 06/03/2018 0928   GLUCOSE 114 06/03/2018 0928   BUN 13 06/03/2018 0928   CREATININE 0.71 06/03/2018 0928   CALCIUM 10.4 06/03/2018 0928   PROT 7.2 06/03/2018 0928   ALBUMIN 4.3 06/03/2018 0928   AST 18 06/03/2018 0928   ALT 27 06/03/2018 0928   ALKPHOS 100 06/03/2018 0928   BILITOT 0.4 06/03/2018 0928   GFRNONAA >60 06/03/2018 0928   GFRAA >60 06/03/2018 0928    No results found for: TOTALPROTELP, ALBUMINELP, A1GS, A2GS, BETS, BETA2SER, GAMS, MSPIKE, SPEI  No results found for: KPAFRELGTCHN, LAMBDASER, [facility]  Lab Results  Component Value Date   WBC 9.6 06/03/2018   NEUTROABS 7.9 (H) 06/03/2018   HGB 11.5 (L) 06/03/2018   HCT 34.4 (L) 06/03/2018   MCV 90.5 06/03/2018   PLT 312 06/03/2018    _0 @  No results found for: LABCA2  No components found for: ATFTDD220  No results for input(s): INR in the last 168 hours.  No results found for: LABCA2  No results found for: URK270  No results found for: WCB762  No results found for:  GBT517  No results found for: CA2729  No components found for: HGQUANT  No results found for: CEA1 / No results found for: CEA1   No results found for: AFPTUMOR  No results found for: CHROMOGRNA  No results found for: PSA1  No visits with results within 3 Day(s) from this visit.  Latest known visit with results is:  Appointment on 06/03/2018  Component Date Value Ref Range Status  . Sodium 06/03/2018 138  136 - 145 mmol/L Final  . Potassium 06/03/2018 3.7  3.5 - 5.1 mmol/L Final  . Chloride 06/03/2018 104  98 - 109 mmol/L Final  . CO2 06/03/2018 25  22 - 29 mmol/L Final  . Glucose, Bld 06/03/2018 114  70 - 140 mg/dL Final  . BUN 06/03/2018 13  7 - 26 mg/dL Final  . Creatinine, Ser 06/03/2018 0.71  0.60 - 1.10 mg/dL Final  . Calcium 06/03/2018 10.4  8.4 - 10.4 mg/dL Final  . Total Protein 06/03/2018 7.2  6.4 - 8.3 g/dL Final  . Albumin 06/03/2018 4.3  3.5 - 5.0 g/dL Final  . AST 06/03/2018 18  5 - 34 U/L Final  . ALT 06/03/2018 27  0 - 55 U/L Final  . Alkaline Phosphatase 06/03/2018 100  40 - 150 U/L Final  . Total Bilirubin 06/03/2018 0.4  0.2 - 1.2 mg/dL Final  . GFR calc non Af Amer 06/03/2018 >60  >60 mL/min Final  . GFR calc Af Amer 06/03/2018 >60  >60 mL/min Final   Comment: (NOTE) The eGFR has been calculated using the CKD EPI equation. This calculation has not been validated in all clinical situations. eGFR's persistently <60 mL/min signify possible Chronic Kidney Disease.   Georgiann Hahn gap 06/03/2018 9  3 - 11 Final   Performed at [facility], [place] [address], [place],  [address]  . WBC 06/03/2018 9.6  3.9 - 10.3 K/uL Final  . RBC 06/03/2018 3.80  3.70 - 5.45 MIL/uL Final  . Hemoglobin 06/03/2018 11.5* 11.6 - 15.9 g/dL Final  . HCT 06/03/2018 34.4* 34.8 - 46.6 % Final  . MCV 06/03/2018 90.5  79.5 - 101.0 fL Final  . MCH 06/03/2018 30.3  25.1 - 34.0 pg Final  . MCHC 06/03/2018 33.4  31.5 - 36.0 g/dL Final  .  RDW 06/03/2018 13.4  11.2  - 14.5 % Final  . Platelets 06/03/2018 312  145 - 400 K/uL Final  . Neutrophils Relative % 06/03/2018 82  % Final  . Neutro Abs 06/03/2018 7.9* 1.5 - 6.5 K/uL Final  . Lymphocytes Relative 06/03/2018 10  % Final  . Lymphs Abs 06/03/2018 1.0  0.9 - 3.3 K/uL Final  . Monocytes Relative 06/03/2018 8  % Final  . Monocytes Absolute 06/03/2018 0.8  0.1 - 0.9 K/uL Final  . Eosinophils Relative 06/03/2018 0  % Final  . Eosinophils Absolute 06/03/2018 0.0  0.0 - 0.5 K/uL Final  . Basophils Relative 06/03/2018 0  % Final  . Basophils Absolute 06/03/2018 0.0  0.0 - 0.1 K/uL Final   Performed at [facility], [place] [address], [place], [place] [address]    (this displays the last labs from the last 3 days)  No results found for: TOTALPROTELP, ALBUMINELP, A1GS, A2GS, BETS, BETA2SER, GAMS, MSPIKE, SPEI (this displays SPEP labs)  No results found for: KPAFRELGTCHN, LAMBDASER, KAPLAMBRATIO (kappa/lambda light chains)  No results found for: HGBA, HGBA2QUANT, HGBFQUANT, HGBSQUAN (Hemoglobinopathy evaluation)   No results found for: LDH  No results found for: IRON, TIBC, IRONPCTSAT (Iron and TIBC)  No results found for: FERRITIN  Urinalysis No results found for: COLORURINE, APPEARANCEUR, LABSPEC, PHURINE, GLUCOSEU, HGBUR, BILIRUBINUR, KETONESUR, PROTEINUR, UROBILINOGEN, NITRITE, LEUKOCYTESUR   STUDIES: No results found.  ELIGIBLE FOR AVAILABLE RESEARCH PROTOCOL: not discussed  ASSESSMENT: 52 y.o. [place], [place] woman status post left mastectomy and sentinel lymph node sampling 04/02/2018 for a pT2 pN1, stage IIB invasive ductal carcinoma, grade 3, estrogen and progesterone receptor negative, but HER-2 amplified  (1) genetics testing 06/15/2018  (2) adjuvant chemotherapy consisting of carboplatin and docetaxel given every 21 days x 6, started 05/13/2018  (3) trastuzumab and Pertuzumab will started 05/13/2018 with chemotherapy and continue for 6 months  (a)  echocardiogram 05/06/2018 shows an ejection fraction in the 65-70%  (4) adjuvant radiation to follow  (5) referral to plastics placed 05/03/2018    PLAN: Real Cons, NP  06/09/18 8:41 AM Medical Oncology and Hematology [facility] [place], [place] [address] Tel. [phone]    Fax. [phone]

## 2018-06-10 ENCOUNTER — Inpatient Hospital Stay: Payer: BC Managed Care – PPO

## 2018-06-10 ENCOUNTER — Inpatient Hospital Stay (HOSPITAL_BASED_OUTPATIENT_CLINIC_OR_DEPARTMENT_OTHER): Payer: BC Managed Care – PPO | Admitting: Adult Health

## 2018-06-10 ENCOUNTER — Inpatient Hospital Stay: Payer: BC Managed Care – PPO | Admitting: Adult Health

## 2018-06-10 VITALS — BP 127/77 | HR 88 | Temp 98.4°F | Resp 18 | Ht 61.0 in | Wt 124.3 lb

## 2018-06-10 DIAGNOSIS — C50512 Malignant neoplasm of lower-outer quadrant of left female breast: Secondary | ICD-10-CM

## 2018-06-10 DIAGNOSIS — Z171 Estrogen receptor negative status [ER-]: Secondary | ICD-10-CM

## 2018-06-10 DIAGNOSIS — Z5111 Encounter for antineoplastic chemotherapy: Secondary | ICD-10-CM | POA: Diagnosis not present

## 2018-06-10 DIAGNOSIS — R21 Rash and other nonspecific skin eruption: Secondary | ICD-10-CM

## 2018-06-10 DIAGNOSIS — Z95828 Presence of other vascular implants and grafts: Secondary | ICD-10-CM | POA: Insufficient documentation

## 2018-06-10 DIAGNOSIS — Z9012 Acquired absence of left breast and nipple: Secondary | ICD-10-CM

## 2018-06-10 LAB — COMPREHENSIVE METABOLIC PANEL
ALT: 22 U/L (ref 0–55)
AST: 18 U/L (ref 5–34)
Albumin: 4.2 g/dL (ref 3.5–5.0)
Alkaline Phosphatase: 156 U/L — ABNORMAL HIGH (ref 40–150)
Anion gap: 7 (ref 3–11)
BUN: 9 mg/dL (ref 7–26)
CHLORIDE: 101 mmol/L (ref 98–109)
CO2: 27 mmol/L (ref 22–29)
Calcium: 10.2 mg/dL (ref 8.4–10.4)
Creatinine, Ser: 0.79 mg/dL (ref 0.60–1.10)
GFR calc Af Amer: >60 mL/min (ref >60)
Glucose, Bld: 85 mg/dL (ref 70–140)
POTASSIUM: 4.5 mmol/L (ref 3.5–5.1)
Sodium: 135 mmol/L — ABNORMAL LOW (ref 136–145)
Total Bilirubin: 0.2 mg/dL (ref 0.2–1.2)
Total Protein: 7 g/dL (ref 6.4–8.3)

## 2018-06-10 LAB — CBC WITH DIFFERENTIAL/PLATELET
Basophils Absolute: 0.1 10*3/uL (ref 0.0–0.1)
Basophils Relative: 0 %
EOS PCT: 0 %
Eosinophils Absolute: 0.1 10*3/uL (ref 0.0–0.5)
HCT: 39.1 % (ref 34.8–46.6)
HEMOGLOBIN: 12.8 g/dL (ref 11.6–15.9)
LYMPHS ABS: 3.8 10*3/uL — AB (ref 0.9–3.3)
LYMPHS PCT: 15 %
MCH: 30 pg (ref 25.1–34.0)
MCHC: 32.7 g/dL (ref 31.5–36.0)
MCV: 91.9 fL (ref 79.5–101.0)
Monocytes Absolute: 2.7 10*3/uL — ABNORMAL HIGH (ref 0.1–0.9)
Monocytes Relative: 11 %
NEUTROS PCT: 74 %
Neutro Abs: 18.2 10*3/uL — ABNORMAL HIGH (ref 1.5–6.5)
Platelets: 134 10*3/uL — ABNORMAL LOW (ref 145–400)
RBC: 4.26 MIL/uL (ref 3.70–5.45)
RDW: 13.1 % (ref 11.2–14.5)
WBC: 24.8 10*3/uL — ABNORMAL HIGH (ref 3.9–10.3)

## 2018-06-10 MED ORDER — HEPARIN SOD (PORK) LOCK FLUSH 100 UNIT/ML IV SOLN
500.0000 [IU] | Freq: Once | INTRAVENOUS | Status: DC | PRN
  Filled 2018-06-10: qty 5

## 2018-06-10 MED ORDER — SODIUM CHLORIDE 0.9% FLUSH
10.0000 mL | INTRAVENOUS | Status: DC | PRN
  Filled 2018-06-10: qty 10

## 2018-06-10 NOTE — Progress Notes (Signed)
[place]  [phone] [phone]     ID: Monica Padilla DOB: Sep 14, 1966  MR#: [id number]  JKD#:[id number]  Patient Care Team: Algis Greenhouse, MD as PCP - General (Family Medicine) Signe Colt, MD as Referring Physician (Obstetrics and Gynecology) Magrinat, Virgie Dad, MD as Consulting Physician (Oncology) Larey Dresser, MD as Consulting Physician (Cardiology) Noberto Retort Juanda Bond., MD as Referring Physician (Surgery) Gatha Mayer, MD as Consulting Physician (Gastroenterology) Nolon Nations, MD as Consulting Physician (Diagnostic Radiology) OTHER MD: Dr. Jimmye Norman and Dr. 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 She brought this to medical attention immediately and had left diagnostic mammography with tomography at breast center  03/11/2018 showing a 2.5 cm palpable mass in the lower outer quadrant of the left breast, with one slightly prominent lymph node that had increased cortical measurement.  Biopsy of this left breast mass 03/17/2018 showed ([id number]), invasive ductal carcinoma, grade 2, estrogen and progesterone receptor negative, with an MIB-1 of 80%, but HER-2 amplified with a signals ratio 5.51 and the number per cell 17.35.  She underwent left mastectomy and sentinel lymph node sampling, as well as right-sided port placement 04/02/2018 under Etheleen Mayhew at [facility] for a 2.6 cm invasive ductal carcinoma, grade 3, with 1 of 3 sentinel nodes showing a micrometastatic deposit, but all margins negative.  She had a port placed at the same time.  The patient's subsequent history is as detailed below.  INTERVAL HISTORY: Monica Padilla returns today for a follow-up and treatment of her  estrogen receptor negative, HER-2 positive breast cancer. She is accompanied by her sister, Monica Padilla. She is being treated with carboplatin, docetaxel, trastuzumab and Pertuzumab.  Today is day 8 cycle 2 of 6 planned cycles repeated every 21 days.   Her echocardiogram on 05/06/2018 showed an excellent ejection fraction in the 65/70% range   REVIEW OF SYSTEMS: Monica Padilla is doing moderately well after treatment.  She does note a recurrence in her skin rash after her treatment.  This is similar to what happened after her first cycle.  She saw Monica Lei and was placed on a Prednisone taper. The rash does not itch.  She has steroid cream leftover from the rash after the first cycle and is using it.  She denies chest pain, dyspnea, wheezing.  She has experienced diarrhea, however it is controlled with imodium.  She denies peripheral neuropathy.     PAST MEDICAL HISTORY: Past Medical History:  Diagnosis Date  . Cancer    basal cell CA- chest, leg  . GERD (gastroesophageal reflux disease)   s.   PAST SURGICAL HISTORY: Past Surgical History:  Procedure Laterality Date  . PARTIAL HYSTERECTOMY    . SKIN CANCER EXCISION      FAMILY HISTORY Family History  Problem Relation Age of Onset  . Colon cancer Father   . Esophageal cancer Neg Hx   . Stomach cancer Neg Hx   . Rectal cancer Neg Hx    The patient's father died due to colon cancer at age 80. The patient's mother is alive at age 2. The patient has 1 sister and no brothers. A paternal cousin was diagnosed with breast cancer in the early 41's (before 63). The patient's paternal grandfather passed away from colon cancer. The  patient's father had 3 brothers, one of whom had lung cancer.   GYNECOLOGIC HISTORY:  No LMP recorded. Patient has had a hysterectomy. Menarche: 52 years old Age at first live birth: 52 years old She is GXP2.  The patient is status post partial hysterectomy due to uterine fibroids. She retains her cervix and ovaries.  After her  hysterectomy she used Estradiol with no complications.  She stopped hormone replacement March 2019.  She is now having hot flashes and nigh sweats that only allow her to sleep 3 hours at a time.   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 Her husband, Monica Padilla, is a Hydrologist for alarms and cameras for [employer]. The patient's oldest son, Monica Padilla, is 76 and works as a Civil engineer, contracting in [place]. The patient's son, Monica Padilla, is 30 an works for a Dispensing optician. Monica Padilla lives at home with the patient. Monica Padilla does not have any grandchildren. She attends [employer].     ADVANCED DIRECTIVES:    HEALTH MAINTENANCE: Social History   Tobacco Use  . Smoking status: Never Smoker  . Smokeless tobacco: Never Used  Substance Use Topics  . Alcohol use: Yes    Comment: occasional  . Drug use: No     Colonoscopy: 10/04/2013/ Dr. Carlean Purl / polyp removal  PAP:  Bone density: remote   Allergies  Allergen Reactions  . Codeine Itching  . Nitrofurantoin     REACTION: pruritis  . Amoxicillin Rash    Current Outpatient Medications  Medication Sig Dispense Refill  . Ascorbic Acid (VITAMIN C PO) Take 500 mg by mouth daily. With rose hips    . Cholecalciferol (VITAMIN D-1000 MAX ST) 1000 units tablet Take by mouth.    . CRANBERRY PO Take by mouth daily.    Marland Kitchen dexamethasone (DECADRON) 4 MG tablet Take 2 tablets (8 mg total) by mouth 2 (two) times daily. Start the day before Taxotere. Take once the day after, then 2 times a day x 2d. 30 tablet 1  . gabapentin (NEURONTIN) 300 MG capsule Take 1 capsule (300 mg total) by mouth at bedtime. 90 capsule 4  . lidocaine-prilocaine (EMLA) cream Apply to affected area once 30 g 3  . LORazepam (ATIVAN) 0.5 MG tablet Take 1 tablet (0.5 mg total) by mouth at bedtime as needed for anxiety. 30 tablet 1  . pantoprazole (PROTONIX) 40 MG tablet Take by mouth.    . predniSONE (DELTASONE) 5 MG tablet 6 tab x 1 day,  5 tab x 1 day, 4 tab x 1 day, 3 tab x 1 day, 2 tab x 1 day, 1 tab x 1 day, stop 21 tablet 0  . prochlorperazine (COMPAZINE) 10 MG tablet Take 1 tablet (10 mg total) by mouth every 6 (six) hours as needed (Nausea or vomiting). 30 tablet 1  . triamcinolone lotion (KENALOG) 0.1 % Apply 1 application topically 3 (three) times daily. 120 mL 1   No current facility-administered medications for this visit.     OBJECTIVE:   Vitals:   06/10/18 1303  BP: 127/77  Pulse: 88  Resp: 18  Temp: 98.4 F (36.9 C)  SpO2: 100%     Body mass index is 23.49 kg/m.   Wt Readings from Last 3 Encounters:  06/10/18 124 lb 4.8 oz (56.4 kg)  05/26/18 128 lb 3.2 oz (58.2 kg)  05/21/18 124 lb 1.6 oz (56.3 kg)      ECOG FS:1 GENERAL: Patient is a  well appearing female in no acute distress HEENT:  Sclerae anicteric.  Oropharynx clear and moist. No ulcerations or evidence of oropharyngeal candidiasis. Neck is supple.  NODES:  No cervical, supraclavicular, or axillary lymphadenopathy palpated.  BREAST EXAM:  Deferred. LUNGS:  Clear to auscultation bilaterally.  No wheezes or rhonchi. HEART:  Regular rate and rhythm. No murmur appreciated. ABDOMEN:  Soft, nontender.  Positive, normoactive bowel sounds. No organomegaly palpated. MSK:  No focal spinal tenderness to palpation. Full range of motion bilaterally in the upper extremities. EXTREMITIES:  No peripheral edema.   SKIN: See rash picture below. No nail dyscrasia. NEURO:  Nonfocal. Well oriented.  Appropriate affect.  Rash 06/10/2018     LAB RESULTS:  CMP     Component Value Date/Time   NA 135 (L) 06/10/2018 1219   K 4.5 06/10/2018 1219   CL 101 06/10/2018 1219   CO2 27 06/10/2018 1219   GLUCOSE 85 06/10/2018 1219   BUN 9 06/10/2018 1219   CREATININE 0.79 06/10/2018 1219   CALCIUM 10.2 06/10/2018 1219   PROT 7.0 06/10/2018 1219   ALBUMIN 4.2 06/10/2018 1219   AST 18 06/10/2018 1219   ALT 22 06/10/2018 1219   ALKPHOS 156 (H) 06/10/2018 1219     BILITOT 0.2 06/10/2018 1219   GFRNONAA >60 06/10/2018 1219   GFRAA >60 06/10/2018 1219    No results found for: TOTALPROTELP, ALBUMINELP, A1GS, A2GS, BETS, BETA2SER, GAMS, MSPIKE, SPEI  No results found for: KPAFRELGTCHN, LAMBDASER, KAPLAMBRATIO  Lab Results  Component Value Date   WBC 24.8 (H) 06/10/2018   NEUTROABS 18.2 (H) 06/10/2018   HGB 12.8 06/10/2018   HCT 39.1 06/10/2018   MCV 91.9 06/10/2018   PLT 134 (L) 06/10/2018    '@LASTCHEMISTRY' @  No results found for: LABCA2  No components found for: [id number]  No results for input(s): INR in the last 168 hours.  No results found for: LABCA2  No results found for: JJH417  No results found for: EYC144  No results found for: YJE563  No results found for: CA2729  No components found for: HGQUANT  No results found for: CEA1 / No results found for: CEA1   No results found for: AFPTUMOR  No results found for: CHROMOGRNA  No results found for: PSA1  Appointment on 06/10/2018  Component Date Value Ref Range Status  . Sodium 06/10/2018 135* 136 - 145 mmol/L Final  . Potassium 06/10/2018 4.5  3.5 - 5.1 mmol/L Final  . Chloride 06/10/2018 101  98 - 109 mmol/L Final  . CO2 06/10/2018 27  22 - 29 mmol/L Final  . Glucose, Bld 06/10/2018 85  70 - 140 mg/dL Final  . BUN 06/10/2018 9  7 - 26 mg/dL Final  . Creatinine, Ser 06/10/2018 0.79  0.60 - 1.10 mg/dL Final  . Calcium 06/10/2018 10.2  8.4 - 10.4 mg/dL Final  . Total Protein 06/10/2018 7.0  6.4 - 8.3 g/dL Final  . Albumin 06/10/2018 4.2  3.5 - 5.0 g/dL Final  . AST 06/10/2018 18  5 - 34 U/L Final  . ALT 06/10/2018 22  0 - 55 U/L Final  . Alkaline Phosphatase 06/10/2018 156* 40 - 150 U/L Final  . Total Bilirubin 06/10/2018 0.2  0.2 - 1.2 mg/dL Final  . GFR calc non Af Amer 06/10/2018 >60  >60 mL/min Final  . GFR calc Af Amer 06/10/2018 >60  >60 mL/min Final   Comment: (NOTE) The eGFR has been calculated using the CKD EPI equation. This calculation has not  been  validated in all clinical situations. eGFR's persistently <60 mL/min signify possible Chronic Kidney Disease.   Georgiann Hahn gap 06/10/2018 7  3 - 11 Final   Performed at [facility], [place] [address] [place], [place] [address]  . WBC 06/10/2018 24.8* 3.9 - 10.3 K/uL Final  . RBC 06/10/2018 4.26  3.70 - 5.45 MIL/uL Final  . Hemoglobin 06/10/2018 12.8  11.6 - 15.9 g/dL Final  . HCT 06/10/2018 39.1  34.8 - 46.6 % Final  . MCV 06/10/2018 91.9  79.5 - 101.0 fL Final  . MCH 06/10/2018 30.0  25.1 - 34.0 pg Final  . MCHC 06/10/2018 32.7  31.5 - 36.0 g/dL Final  . RDW 06/10/2018 13.1  11.2 - 14.5 % Final  . Platelets 06/10/2018 134* 145 - 400 K/uL Final  . Neutrophils Relative % 06/10/2018 74  % Final  . Neutro Abs 06/10/2018 18.2* 1.5 - 6.5 K/uL Final  . Lymphocytes Relative 06/10/2018 15  % Final  . Lymphs Abs 06/10/2018 3.8* 0.9 - 3.3 K/uL Final  . Monocytes Relative 06/10/2018 11  % Final  . Monocytes Absolute 06/10/2018 2.7* 0.1 - 0.9 K/uL Final  . Eosinophils Relative 06/10/2018 0  % Final  . Eosinophils Absolute 06/10/2018 0.1  0.0 - 0.5 K/uL Final  . Basophils Relative 06/10/2018 0  % Final  . Basophils Absolute 06/10/2018 0.1  0.0 - 0.1 K/uL Final   Performed at [facility], [place] [address], [place], [place] [address]    (this displays the last labs from the last 3 days)  No results found for: TOTALPROTELP, ALBUMINELP, A1GS, A2GS, BETS, BETA2SER, GAMS, MSPIKE, SPEI (this displays SPEP labs)  No results found for: KPAFRELGTCHN, LAMBDASER, KAPLAMBRATIO (kappa/lambda light chains)  No results found for: HGBA, HGBA2QUANT, HGBFQUANT, HGBSQUAN (Hemoglobinopathy evaluation)   No results found for: LDH  No results found for: IRON, TIBC, IRONPCTSAT (Iron and TIBC)  No results found for: FERRITIN  Urinalysis No results found for: COLORURINE, APPEARANCEUR, LABSPEC, PHURINE, GLUCOSEU, HGBUR, BILIRUBINUR, KETONESUR, PROTEINUR,  UROBILINOGEN, NITRITE, LEUKOCYTESUR   STUDIES: No results found.  ELIGIBLE FOR AVAILABLE RESEARCH PROTOCOL: not discussed  ASSESSMENT: 52 y.o. [place],  woman status post left mastectomy and sentinel lymph node sampling 04/02/2018 for a pT2 pN1, stage IIB invasive ductal carcinoma, grade 3, estrogen and progesterone receptor negative, but HER-2 amplified  (1) genetics testing 06/15/2018  (2) adjuvant chemotherapy consisting of carboplatin and docetaxel given every 21 days x 6, started 05/13/2018  (3) trastuzumab and Pertuzumab will started 05/13/2018 with chemotherapy and continue for 6 months  (a) echocardiogram 05/06/2018 shows an ejection fraction in the 65-70%  (4) adjuvant radiation to follow  (5) referral to plastics placed 05/03/2018    PLAN: Nikie is doing well following chemotherapy.  She tolerated her second cycle of Docetaxel, Carboplatin, Trastuzumab, and Pertuzumab well.  I let her know that I will review the issue of the continued rash that occurs following treatment with Dr. Jana Hakim, to evaluate what he thinks about it, and if any changes need to be made in her treatment plan.  She is otherwise doing well.  I reviewed her labs with her in detail, and they are stable.  She is scheduled to return in two weeks for labs, f/u and her next cycle of treatment.  I let her know that I will call her once I talk to Dr. Jana Hakim about her rash.    I encouraged her to call us with any other issues  that may develop prior to her next appointment with [place].  A total of (30) minutes of face-to-face time was spent with this patient with greater than 50% of that time in counseling and care-coordination.    Wilber Bihari, NP  06/11/18 7:53 AM Medical Oncology and Hematology [facility] [address] [place], [place] [address] Tel. [phone]    Fax. [phone]

## 2018-06-10 NOTE — Progress Notes (Signed)
[phone]

## 2018-06-10 NOTE — Progress Notes (Signed)
Pt had port accessed last week and wants her lbas drawn out of her arm. Called intake in the lab and notified them

## 2018-06-11 ENCOUNTER — Encounter: Payer: Self-pay | Admitting: Adult Health

## 2018-06-14 ENCOUNTER — Inpatient Hospital Stay (HOSPITAL_BASED_OUTPATIENT_CLINIC_OR_DEPARTMENT_OTHER): Payer: BC Managed Care – PPO | Admitting: Medical

## 2018-06-14 ENCOUNTER — Other Ambulatory Visit: Payer: Self-pay | Admitting: Oncology

## 2018-06-14 VITALS — BP 134/82 | HR 73 | Temp 98.4°F | Resp 17 | Ht 61.0 in | Wt 126.8 lb

## 2018-06-14 DIAGNOSIS — Z5111 Encounter for antineoplastic chemotherapy: Secondary | ICD-10-CM | POA: Diagnosis not present

## 2018-06-14 DIAGNOSIS — C50512 Malignant neoplasm of lower-outer quadrant of left female breast: Secondary | ICD-10-CM

## 2018-06-14 DIAGNOSIS — L270 Generalized skin eruption due to drugs and medicaments taken internally: Secondary | ICD-10-CM | POA: Diagnosis not present

## 2018-06-14 DIAGNOSIS — Z171 Estrogen receptor negative status [ER-]: Secondary | ICD-10-CM

## 2018-06-14 DIAGNOSIS — T50905A Adverse effect of unspecified drugs, medicaments and biological substances, initial encounter: Secondary | ICD-10-CM | POA: Diagnosis not present

## 2018-06-14 MED ORDER — METHYLPREDNISOLONE 4 MG PO TBPK: ORAL_TABLET | ORAL | 0 refills | Status: DC

## 2018-06-14 MED ORDER — DOXYCYCLINE HYCLATE 100 MG PO CAPS: 100.0000 mg | ORAL_CAPSULE | Freq: Two times a day (BID) | ORAL | 0 refills | Status: DC

## 2018-06-14 NOTE — Progress Notes (Signed)
[facility] Progress Note   MONTY MCCARRELL [id number] Nov 16, 1966 52 y.o.  Monica Padilla is managed by Dr. Jana Hakim  Actively treated with chemotherapy/immunotherapy: yes  Current Therapy: Carboplatin, docetaxel, trastuzumab and pertuzumab  Last Treated: 06/03/2018 (cycle 2, day 1)  Assessment: Plan:    Rash, drug - Plan: doxycycline (VIBRAMYCIN) 100 MG capsule, methylPREDNISolone (MEDROL DOSEPAK) 4 MG TBPK tablet   Drug rash: The patient was given a prescription for doxycycline 100 mg p.o. twice daily with 10 days given.  I have discussed with the patient that Dr. Jana Hakim may reduce this to 7 days after seeing the patient in follow-up on 06/21/2018 at 12:30 PM.  Additionally the patient has been given a Medrol Dosepak.  Please see After Visit Summary for patient specific instructions.  Future Appointments  Date Time Provider [place]  06/15/2018 10:00 AM Tana Felts [facility] None  06/15/2018 11:00 AM [facility] LAB 2 [facility] None  06/15/2018 11:15 AM [facility] LAB 6 [facility] None  06/21/2018 12:30 PM Magrinat, Virgie Dad, MD [facility] None  06/24/2018  9:30 AM [facility] LAB 6 [facility] None  06/24/2018  9:45 AM [facility] INJ NURSE [facility] None  06/24/2018 10:45 AM [facility] A1 [facility] None  06/26/2018  1:45 PM [facility] INJ NURSE [facility] None  06/30/2018  8:00 AM Magrinat, Virgie Dad, MD [facility] None  06/30/2018  8:30 AM [facility] LAB 2 [facility] None  07/15/2018  9:30 AM [facility] LAB 5 [facility] None  07/15/2018 10:00 AM [facility] INFUSION [facility] None  07/15/2018 11:00 AM [facility] INFUSION [facility] None  07/17/2018  1:30 PM [facility] [place] FLUSH [facility] None  08/05/2018  9:30 AM [facility] LAB 4 [facility] None  08/05/2018  9:45 AM [facility] FLUSH NURSE 2 [facility] None  08/05/2018 11:00 AM [facility] INFUSION [facility] None  08/07/2018  1:15 PM [facility] [place] FLUSH [facility] None  08/26/2018   9:30 AM [facility] LAB ONLY [facility] None  08/26/2018  9:45 AM [facility] [place] FLUSH [facility] None  08/26/2018 11:00 AM [facility] INFUSION [facility] None  08/28/2018  1:15 PM [facility] [place] FLUSH [facility] None  09/16/2018  9:30 AM [facility] LAB 6 [facility] None  09/16/2018  9:45 AM [facility] [place] FLUSH [facility] None  09/16/2018 11:00 AM [facility] INFUSION [facility] None  09/18/2018  1:15 PM [facility] [place] FLUSH [facility] None  10/07/2018  9:30 AM [facility] LAB 5 [facility] None  10/07/2018  9:45 AM [facility] FLUSH NURSE 2 [facility] None  10/07/2018 11:00 AM [facility] INFUSION [facility] None  10/09/2018  1:45 PM [facility] INJ NURSE [facility] None    No orders of the defined types were placed in this encounter.     Subjective:   Patient ID:  Monica Padilla is a 52 y.o. (DOB 1966-08-10) female.  Chief Complaint: No chief complaint on file.   HPI Monica Padilla is a 52 year old female with a diagnosis of an ER negative, HER-2 positive invasive ductal carcinoma of the left breast with 1 of 3 sentinel lymph node showing micrometastatic deposits.  She is managed by Dr. Jana Hakim and is status post cycle 2-day 1 of carboplatin, docetaxel, trastuzumab and pertuzumab.  Ms. Monica Padilla presents to the office today with worsening of a diffuse erythematous and pruritic rash over her chest and anterior neck.  She has also developed lesions on her legs.  She has several pustules on erythematous bases on her left medial leg and a large erythematous and hyperpigmented lesion on her left lateral leg.  She is previously been treated with a prednisone taper and topical triamcinolone  lotion.  She has no other symptoms of concern.  Medications: I have reviewed the patient's current medications.  Allergies:  Allergies  Allergen Reactions  . Codeine Itching  . Nitrofurantoin     REACTION: pruritis  . Amoxicillin Rash    Past Medical History:  Diagnosis Date  . Cancer (HCC)    basal cell  CA- chest, leg  . GERD (gastroesophageal reflux disease)     Past Surgical History:  Procedure Laterality Date  . PARTIAL HYSTERECTOMY    . SKIN CANCER EXCISION      Family History  Problem Relation Age of Onset  . Colon cancer Father   . Esophageal cancer Neg Hx   . Stomach cancer Neg Hx   . Rectal cancer Neg Hx     Social History   Socioeconomic History  . Marital status: Married    Spouse name: Not on file  . Number of children: Not on file  . Years of education: Not on file  . Highest education level: Not on file  Occupational History  . Not on file  Social Needs  . Financial resource strain: Not on file  . Food insecurity:    Worry: Not on file    Inability: Not on file  . Transportation needs:    Medical: Not on file    Non-medical: Not on file  Tobacco Use  . Smoking status: Never Smoker  . Smokeless tobacco: Never Used  Substance and Sexual Activity  . Alcohol use: Yes    Comment: occasional  . Drug use: No  . Sexual activity: Not on file  Lifestyle  . Physical activity:    Days per week: Not on file    Minutes per session: Not on file  . Stress: Not on file  Relationships  . Social connections:    Talks on phone: Not on file    Gets together: Not on file    Attends religious service: Not on file    Active member of club or organization: Not on file    Attends meetings of clubs or organizations: Not on file    Relationship status: Not on file  . Intimate partner violence:    Fear of current or ex partner: Not on file    Emotionally abused: Not on file    Physically abused: Not on file    Forced sexual activity: Not on file  Other Topics Concern  . Not on file  Social History Narrative  . Not on file    Past Medical History, Surgical history, Social history, and Family history were reviewed and updated as appropriate.   Please see review of systems for further details on the patient's review from today.   Review of Systems:  Review of  Systems  HENT: Negative for mouth sores and trouble swallowing.   Respiratory: Negative for choking, chest tightness and shortness of breath.   Skin: Positive for rash.    Objective:   Physical Exam:  BP 134/82 (BP Location: Right Arm, Patient Position: Sitting)   Pulse 73   Temp 98.4 F (36.9 C) (Oral)   Resp 17   Ht _0  (1.549 m)   Wt 126 lb 12.8 oz (57.5 kg)   SpO2 100%   BMI 23.96 kg/m   ECOG: 0  Physical Exam  Constitutional: No distress.  HENT:  Head: Normocephalic and atraumatic.  Cardiovascular: Normal rate, regular rhythm and normal heart sounds. Exam reveals no gallop and no friction rub.  No murmur heard.  Pulmonary/Chest: Effort normal and breath sounds normal. No stridor. No respiratory distress. She has no wheezes.  Neurological: She is alert. Coordination normal.  Skin: Skin is warm and dry. Rash noted. She is not diaphoretic. There is erythema.  The patient has multiple diffuse slightly raised and erythematous lesions over her chest and neck.  She has several pustules on erythematous bases along her left medial calf and one approximately 1 cm x 1 cm hypopigmented and erythematous lesion over her left lateral calf.  Psychiatric: She has a normal mood and affect. Her behavior is normal. Judgment and thought content normal.           Lab Review:     Component Value Date/Time   NA 135 (L) 06/10/2018 1219   K 4.5 06/10/2018 1219   CL 101 06/10/2018 1219   CO2 27 06/10/2018 1219   GLUCOSE 85 06/10/2018 1219   BUN 9 06/10/2018 1219   CREATININE 0.79 06/10/2018 1219   CALCIUM 10.2 06/10/2018 1219   PROT 7.0 06/10/2018 1219   ALBUMIN 4.2 06/10/2018 1219   AST 18 06/10/2018 1219   ALT 22 06/10/2018 1219   ALKPHOS 156 (H) 06/10/2018 1219   BILITOT 0.2 06/10/2018 1219   GFRNONAA >60 06/10/2018 1219   GFRAA >60 06/10/2018 1219       Component Value Date/Time   WBC 24.8 (H) 06/10/2018 1219   RBC 4.26 06/10/2018 1219   HGB 12.8 06/10/2018 1219    HCT 39.1 06/10/2018 1219   PLT 134 (L) 06/10/2018 1219   MCV 91.9 06/10/2018 1219   MCH 30.0 06/10/2018 1219   MCHC 32.7 06/10/2018 1219   RDW 13.1 06/10/2018 1219   LYMPHSABS 3.8 (H) 06/10/2018 1219   MONOABS 2.7 (H) 06/10/2018 1219   EOSABS 0.1 06/10/2018 1219   BASOSABS 0.1 06/10/2018 1219   -------------------------------  Imaging from last 24 hours (if applicable):  Radiology interpretation: No results found.      This case was discussed with Dr. Jana Hakim. He expressed his agreement with my management of this patient.

## 2018-06-15 ENCOUNTER — Inpatient Hospital Stay: Payer: BC Managed Care – PPO

## 2018-06-15 ENCOUNTER — Other Ambulatory Visit: Payer: BC Managed Care – PPO

## 2018-06-15 ENCOUNTER — Other Ambulatory Visit: Payer: Self-pay | Admitting: Oncology

## 2018-06-15 ENCOUNTER — Encounter: Payer: Self-pay | Admitting: Genetics

## 2018-06-15 ENCOUNTER — Inpatient Hospital Stay: Payer: BC Managed Care – PPO | Admitting: Genetics

## 2018-06-15 ENCOUNTER — Other Ambulatory Visit: Payer: Self-pay

## 2018-06-15 DIAGNOSIS — Z171 Estrogen receptor negative status [ER-]: Principal | ICD-10-CM

## 2018-06-15 DIAGNOSIS — Z800 Family history of malignant neoplasm of digestive organs: Secondary | ICD-10-CM

## 2018-06-15 DIAGNOSIS — Z801 Family history of malignant neoplasm of trachea, bronchus and lung: Secondary | ICD-10-CM

## 2018-06-15 DIAGNOSIS — C50512 Malignant neoplasm of lower-outer quadrant of left female breast: Secondary | ICD-10-CM

## 2018-06-15 DIAGNOSIS — Z803 Family history of malignant neoplasm of breast: Secondary | ICD-10-CM

## 2018-06-15 NOTE — Progress Notes (Signed)
REFERRING PROVIDER: Chauncey Cruel, MD [address] [place], [place] [address]  PRIMARY PROVIDER:  Algis Greenhouse, MD  PRIMARY REASON FOR VISIT:  1. Malignant neoplasm of lower-outer quadrant of left breast of female, estrogen receptor negative ([facility])   2. Family history of colon cancer   3. Family history of breast cancer   4. Family history of lung cancer     HISTORY OF PRESENT ILLNESS:   Monica Padilla, a 52 y.o. female, was seen for a [facility] cancer genetics consultation at the request of Dr. Garlon Hatchet due to a personal and family history of cancer.  Monica Padilla presents to clinic today to discuss the possibility of a hereditary predisposition to cancer, genetic testing, and to further clarify her future cancer risks, as well as potential cancer risks for family members.   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  She had a left mastectomy on 04/12/2018 and is now having adjuvant chemotherapy.  She plans to have adjuvant radiation.  Monica Padilla has a history of 2 basal cell carcinomas on her skin.   CANCER HISTORY:    Malignant neoplasm of lower-outer quadrant of left breast of female, estrogen receptor negative ([facility])   05/03/2018 Initial Diagnosis    Malignant neoplasm of lower-outer quadrant of left breast of female, estrogen receptor negative ([facility])      05/12/2018 -  Chemotherapy    The patient had palonosetron (ALOXI) injection 0.25 mg, 0.25 mg, Intravenous,  Once, 2 of 6 cycles Administration: 0.25 mg (05/13/2018), 0.25 mg (06/03/2018) pegfilgrastim-cbqv (UDENYCA) injection 6 mg, 6 mg, Subcutaneous, Once, 2 of 7 cycles Administration: 6 mg (05/15/2018), 6 mg (06/05/2018) trastuzumab (HERCEPTIN) 450 mg in sodium chloride 0.9 % 250 mL chemo infusion, 462 mg, Intravenous,  Once, 2 of 17 cycles Administration: 450 mg (05/13/2018), 336 mg (06/03/2018) CARBOplatin (PARAPLATIN) 500 mg in sodium chloride 0.9 % 250 mL chemo  infusion, 500 mg (100 % of original dose 498 mg), Intravenous,  Once, 2 of 6 cycles Dose modification:   (original dose 498 mg, Cycle 1),   (original dose 498 mg, Cycle 2) Administration: 500 mg (05/13/2018), 500 mg (06/03/2018) DOCEtaxel (TAXOTERE) 120 mg in sodium chloride 0.9 % 250 mL chemo infusion, 75 mg/m2 = 120 mg, Intravenous,  Once, 2 of 6 cycles Administration: 120 mg (05/13/2018), 120 mg (06/03/2018) pertuzumab (PERJETA) 420 mg in sodium chloride 0.9 % 250 mL chemo infusion, 420 mg (100 % of original dose 420 mg), Intravenous, Once, 2 of 17 cycles Dose modification: 420 mg (original dose 420 mg, Cycle 1, Reason: Provider Judgment) Administration: 420 mg (05/13/2018), 420 mg (06/03/2018) fosaprepitant (EMEND) 150 mg, dexamethasone (DECADRON) 12 mg in sodium chloride 0.9 % 145 mL IVPB, , Intravenous,  Once, 2 of 6 cycles Administration:  (05/13/2018),  (06/03/2018)  for chemotherapy treatment.         HORMONAL RISK FACTORS:  Menarche was at age 70.  First live birth at age 43.  Ovaries intact: yes.  Hysterectomy: yes.  Menopausal status: postmenopausal.  HRT use: stopped march 2019  Colonoscopy: yes, 2014; a polyp, told to have another in 5 yeares. Mammogram within the last year: yes.  Past Medical History:  Diagnosis Date  . Breast cancer ([facility])   . Cancer ([facility])    basal cell CA- chest, leg  . Family history of breast cancer   . Family history of colon cancer   . Family history  of lung cancer   . GERD (gastroesophageal reflux disease)     Past Surgical History:  Procedure Laterality Date  . PARTIAL HYSTERECTOMY    . SKIN CANCER EXCISION      Social History   Socioeconomic History  . Marital status: Married    Spouse name: Not on file  . Number of children: Not on file  . Years of education: Not on file  . Highest education level: Not on file  Occupational History  . Not on file  Social Needs  . Financial resource strain: Not on file  . Food insecurity:    Worry:  Not on file    Inability: Not on file  . Transportation needs:    Medical: Not on file    Non-medical: Not on file  Tobacco Use  . Smoking status: Never Smoker  . Smokeless tobacco: Never Used  Substance and Sexual Activity  . Alcohol use: Yes    Comment: occasional  . Drug use: No  . Sexual activity: Not on file  Lifestyle  . Physical activity:    Days per week: Not on file    Minutes per session: Not on file  . Stress: Not on file  Relationships  . Social connections:    Talks on phone: Not on file    Gets together: Not on file    Attends religious service: Not on file    Active member of club or organization: Not on file    Attends meetings of clubs or organizations: Not on file    Relationship status: Not on file  Other Topics Concern  . Not on file  Social History Narrative  . Not on file     FAMILY HISTORY:  We obtained a detailed, 4-generation family history.  Significant diagnoses are listed below: Monica Padilla has a 39 year-old son and a 37 year-old son with no history of cancer.  Monica Padilla has a 89 year old sister who has had 2-3 basal cell carcinomas removed from her skin.   Family History  Problem Relation Age of Onset  . Colon cancer Father 58  . Basal cell carcinoma Sister        2-3  . Lung cancer Maternal Aunt        hx smoking  . Lung cancer Paternal Uncle        hx smoking  . Heart disease Maternal Grandmother   . Heart disease Maternal Grandfather   . Cancer Paternal Grandfather        unk if colon or stomach? died in early 50's.   . Esophageal cancer Neg Hx   . Stomach cancer Neg Hx   . Rectal cancer Neg Hx    Monica Padilla's father: died at 42 due to colon cancer dx in his 23's.  Paternal aunts/Uncles: 3 paternal uncles, 1 had lung cancer (hx smoking), 3 paternal aunts with no history of cancer.   Paternal cousins: 1 paternal cousin died of breast cancer in her early 40's, she was first diagnosed in her early 25's.  Unk if she had genetic testing or  not.  Paternal grandfather: died in his early 61's due to colon or stomach cancer.  Paternal grandmother:died in her 11's with no history of cancer.   Monica Padilla mother: 9, no history of cancer, uterus and ovaries intact.  Maternal Aunts/Uncles: 7 maternal aunts, 1 had lung cancer (hx smoking), 1 maternal uncle with no history of cancer.  Maternal cousins: 1 maternal cousin had kidney/bladder  cancer in her early 69's.   Maternal grandfather: died in his 74 due to heart disease.  Maternal grandmother:died in her 53's due to heart disease.   Ms. Furnari is unaware of previous family history of genetic testing for hereditary cancer risks. Patient's maternal ancestors are of Northern European descent, and paternal ancestors are of Northern European/Native American descent. There is no reported Ashkenazi Jewish ancestry. There is no known consanguinity.  GENETIC COUNSELING ASSESSMENT: JANIYLAH HANNIS is a 52 y.o. female with a personal and family history which is somewhat suggestive of a Hereditary Cancer Predisposition Syndrome. We, therefore, discussed and recommended the following at today's visit.   DISCUSSION: We reviewed the characteristics, features and inheritance patterns of hereditary cancer syndromes. We also discussed genetic testing, including the appropriate family members to test, the process of testing, insurance coverage and turn-around-time for results. We discussed the implications of a negative, positive and/or variant of uncertain significant result. We recommended Ms. Finfrock pursue genetic testing for the Common Hereditary Cancers gene panel + Renal/Urinary Tract panel +EGFR   The Common Hereditary Cancer Panel offered by [facility] includes sequencing and/or deletion duplication testing of the following 47 genes: APC, ATM, AXIN2, BARD1, BMPR1A, BRCA1, BRCA2, BRIP1, CDH1, CDKN2A (p14ARF), CDKN2A (p16INK4a), CKD4, CHEK2, CTNNA1, DICER1, EPCAM (Deletion/duplication testing only), GREM1  (promoter region deletion/duplication testing only), KIT, MEN1, MLH1, MSH2, MSH3, MSH6, MUTYH, NBN, NF1, NHTL1, PALB2, PDGFRA, PMS2, POLD1, POLE, PTEN, RAD50, RAD51C, RAD51D, SDHB, SDHC, SDHD, SMAD4, SMARCA4. STK11, TP53, TSC1, TSC2, and VHL.  The following genes were evaluated for sequence changes only: SDHA and HOXB13 c.251G>A variant only.  [facility] Renal/Urinary Tract Cancers Panel: BAP1 CDC73 CDKN1C DICER1 DIS3L2 EPCAM FH FLCN GPC3 MET MLH1 MSH2 MSH6 PMS2 PTEN SDHB SDHC SMARCA4 SMARCB1 TP53 TSC1 TSC2 VHL WT1   We discussed that only 5-10% of cancers are associated with a Hereditary cancer predisposition syndrome.  One of the most common hereditary cancer syndromes that increases breast cancer risk is called Hereditary Breast and Ovarian Cancer (HBOC) syndrome.  This syndrome is caused by mutations in the BRCA1 and BRCA2 genes.  This syndrome increases an individual's lifetime risk to develop breast, ovarian, pancreatic, and other types of cancer.  There are also many other cancer predisposition syndromes caused by mutations in several other genes.  We discussed that if she is found to have a mutation in one of these genes, it may impact surgical decisions, and alter future medical management recommendations such as increased cancer screenings and consideration of risk reducing surgeries.  A positive result could also have implications for the patient's family members.  A Negative result would mean we were unable to identify a hereditary component to her cancer, but does not rule out the possibility of a hereditary basis for her cancer.  There could be mutations that are undetectable by current technology, or in genes not yet tested or identified to increase cancer risk.    We discussed the potential to find a Variant of Uncertain Significance or VUS.  These are variants that have not yet been identified as pathogenic or benign, and it is unknown if this variant is associated with increased cancer risk  or if this is a normal finding.  Most VUS's are reclassified to benign or likely benign.   It should not be used to make medical management decisions. With time, we suspect the lab will determine the significance of any VUS's identified if any.   Based on Ms. Heyden's personal and family history of cancer,  she meets NCCN medical criteria for genetic testing. Despite that she meets criteria, she may still have an out of pocket cost. The laboratory will provide her with an estimate of her OOP cost.    PLAN: After considering the risks, benefits, and limitations, Ms. Ozbun  provided informed consent to pursue genetic testing and the blood sample was sent to [facility] for analysis of the Common Hereditary Cancers Panel +Renal/Urinary Tract Panel. Results should be available within approximately 2-3 weeks' time, at which point they will be disclosed by telephone to Ms. Garza, as will any additional recommendations warranted by these results. Ms. Recht will receive a summary of her genetic counseling visit and a copy of her results once available. This information will also be available in Epic. We encouraged Ms. Berroa to remain in contact with cancer genetics annually so that we can continuously update the family history and inform her of any changes in cancer genetics and testing that may be of benefit for her family. Ms. Trinkle questions were answered to her satisfaction today. Our contact information was provided should additional questions or concerns arise.  Based on Ms. Gullo's family history, we recommended her paternal realtives, have genetic counseling and testing. Ms. Peet will let us know if we can be of any assistance in coordinating genetic counseling and/or testing for this family member.   Lastly, we encouraged Ms. Fate to remain in contact with cancer genetics annually so that we can continuously update the family history and inform her of any changes in cancer genetics and testing  that may be of benefit for this family.   Ms.  Soucy questions were answered to her satisfaction today. Our contact information was provided should additional questions or concerns arise. Thank you for the referral and allowing [place] to share in the care of your patient.   Tana Felts, MS, [facility] Certified Genetic Counselor [email or web address] phone: [phone]  The patient was seen for a total of 35 minutes in face-to-face genetic counseling.  This patient was discussed with Drs. Magrinat, Lindi Adie and/or Burr Medico who agrees with the above.

## 2018-06-20 NOTE — Progress Notes (Signed)
[place]  [phone]) [phone] [phone]) [phone]     ID: Monica Padilla DOB: 05/14/1966  MR#: [id number]  ZHY#:[id number]  Patient Care Team: Algis Greenhouse, MD as PCP - General (Family Medicine) Signe Colt, MD as Referring Physician (Obstetrics and Gynecology) Rawlin Reaume, Virgie Dad, MD as Consulting Physician (Oncology) Larey Dresser, MD as Consulting Physician (Cardiology) Noberto Retort Juanda Bond., MD as Referring Physician (Surgery) Gatha Mayer, MD as Consulting Physician (Gastroenterology) Nolon Nations, MD as Consulting Physician (Diagnostic Radiology) OTHER MD: Dr. Jimmye Norman and Dr. Michele Mcalpine at [place]: Estrogen receptor negative, HER-2 positive breast cancer  CURRENT TREATMENT: Chemo/immunotherapy  INTERVAL HISTORY: Monica Padilla returns today for a follow-up and treatment of her estrogen receptor negative, HER-2 positive breast cancer. She receives  Carboplatin and docetaxel, trastuzumab, and pertuzumab every 21 days. Today is day 19 of cycle 2.    She denies issues with oral sores or diarrhea. She has some constipations after treatments, but she aids this with Miralax.   Monica Padilla developed a significant drug rash after her first cycle of chemotherapy.  She was treated with a Medrol Dosepak, and it resolved.  We made some changes in premeds, but she had the same rash after cycle 2.  This is extremely distressing to the patient.  Currently her rashes on the legs and on the back of her neck are scabbing and healing.  She finished a second Medrol Dosepak on 06/19/2018.  She is finishing her last doses of doxycyline. She denies having yeast infections.   REVIEW OF SYSTEMS: Monica Padilla reports that she has been walking. [place] walked [place] the beach and with her husband and friend. She denies unusual headaches, visual changes, nausea, vomiting, or dizziness. There has been no unusual cough, phlegm production, or pleurisy. This been no change  in bowel or bladder habits. She denies unexplained fatigue or unexplained weight loss, bleeding, rash, or fever. A detailed review of systems was otherwise stable.   HISTORY OF CURRENT ILLNESS: From the original intake note:  The patient noted a palpable mass in her left breast as she was laying down. She brought this to medical attention immediately and had left diagnostic mammography with tomography at breast center  03/11/2018 showing a 2.5 cm palpable mass in the lower outer quadrant of the left breast, with one slightly prominent lymph node that had increased cortical measurement.  Biopsy of this left breast mass 03/17/2018 showed ([id number]), invasive ductal carcinoma, grade 2, estrogen and progesterone receptor negative, with an MIB-1 of 80%, but HER-2 amplified with a signals ratio 5.51 and the number per cell 17.35.  She underwent left mastectomy and sentinel lymph node sampling, as well as right-sided port placement 04/02/2018 under Etheleen Mayhew at [facility] for a 2.6 cm invasive ductal carcinoma, grade 3, with 1 of 3 sentinel nodes showing a micrometastatic deposit, but all margins negative.  She had a port placed at the same time.  The patient's subsequent history is as detailed below.    PAST MEDICAL HISTORY: Past Medical History:  Diagnosis Date  . Breast cancer ([place])   . Cancer ([facility])    basal cell CA- chest, leg  . Family history of breast cancer   . Family history of colon cancer   . Family history of lung cancer   . GERD (gastroesophageal reflux disease)   s.   PAST SURGICAL HISTORY: Past Surgical History:  Procedure Laterality Date  . PARTIAL HYSTERECTOMY    . SKIN CANCER EXCISION  FAMILY HISTORY Family History  Problem Relation Age of Onset  . Colon cancer Father 2  . Basal cell carcinoma Sister        2-3  . Lung cancer Maternal Aunt        hx smoking  . Lung cancer Paternal Uncle        hx smoking  . Heart disease Maternal Grandmother   .  Heart disease Maternal Grandfather   . Cancer Paternal Grandfather        unk if colon or stomach? died in early 54's.   . Esophageal cancer Neg Hx   . Stomach cancer Neg Hx   . Rectal cancer Neg Hx    The patient's father died due to colon cancer at age 78. The patient's mother is alive at age 30. The patient has 1 sister and no brothers. A paternal cousin was diagnosed with breast cancer in the early 74's (before 74). The patient's paternal grandfather passed away from colon cancer. The patient's father had 3 brothers, one of whom had lung cancer.   GYNECOLOGIC HISTORY:  No LMP recorded. Patient has had a hysterectomy. Menarche: 52 years old Age at first live birth: 52 years old She is GXP2.  The patient is status post partial hysterectomy due to uterine fibroids. She retains her cervix and ovaries.  After her hysterectomy she used Estradiol with no complications.  She stopped hormone replacement March 2019.  She is now having hot flashes and nigh sweats that only allow her to sleep 3 hours at a time.   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 Her husband, [name], is a Hydrologist for alarms and cameras for [employer]. The patient's oldest son, [name], is 64 and works as a Civil engineer, contracting in [place]. The patient's son, [name], is 94 an works for a Dispensing optician. [name] lives at home with the patient. Monica Padilla does not have any grandchildren. She attends [employer].     ADVANCED DIRECTIVES:    HEALTH MAINTENANCE: Social History   Tobacco Use  . Smoking status: Never Smoker  . Smokeless tobacco: Never Used  Substance Use Topics  . Alcohol use: Yes    Comment: occasional  . Drug use: No     Colonoscopy: 10/04/2013/ Dr. Carlean Purl / polyp removal  PAP:  Bone density: remote   Allergies  Allergen Reactions  . Codeine Itching  . Nitrofurantoin     REACTION: pruritis  . Amoxicillin Rash    Current Outpatient  Medications  Medication Sig Dispense Refill  . Ascorbic Acid (VITAMIN C PO) Take 500 mg by mouth daily. With rose hips    . Cholecalciferol (VITAMIN D-1000 MAX ST) 1000 units tablet Take by mouth.    . CRANBERRY PO Take by mouth daily.    Marland Kitchen dexamethasone (DECADRON) 4 MG tablet Take 2 tablets (8 mg total) by mouth 2 (two) times daily. Start the day before Taxotere. Take once the day after, then 2 times a day x 2d. 30 tablet 1  . doxycycline (VIBRAMYCIN) 100 MG capsule Take 1 capsule (100 mg total) by mouth 2 (two) times daily. 20 capsule 0  . gabapentin (NEURONTIN) 300 MG capsule Take 1 capsule (300 mg total) by mouth at bedtime. 90 capsule 4  . lidocaine-prilocaine (EMLA) cream Apply to affected area once 30 g 3  . LORazepam (ATIVAN) 0.5 MG tablet Take 1 tablet (0.5 mg total) by mouth at bedtime as needed for anxiety. [place]  tablet 1  . methylPREDNISolone (MEDROL DOSEPAK) 4 MG TBPK tablet 6 x 1 day, 5 x 1 day, 4 x 1 day, 3 x 1 day, 2 x 1 day, 1 x 1 day 21 tablet 0  . pantoprazole (PROTONIX) 40 MG tablet Take by mouth.    . predniSONE (DELTASONE) 5 MG tablet 6 tab x 1 day, 5 tab x 1 day, 4 tab x 1 day, 3 tab x 1 day, 2 tab x 1 day, 1 tab x 1 day, stop 21 tablet 0  . prochlorperazine (COMPAZINE) 10 MG tablet Take 1 tablet (10 mg total) by mouth every 6 (six) hours as needed (Nausea or vomiting). 30 tablet 1  . triamcinolone lotion (KENALOG) 0.1 % Apply 1 application topically 3 (three) times daily. 120 mL 1   No current facility-administered medications for this visit.     OBJECTIVE: Young appearing white woman  There were no vitals filed for this visit.   There is no height or weight on file to calculate BMI.   Wt Readings from Last 3 Encounters:  06/14/18 126 lb 12.8 oz (57.5 kg)  06/10/18 124 lb 4.8 oz (56.4 kg)  05/26/18 128 lb 3.2 oz (58.2 kg)      ECOG FS:1  Sclerae unicteric, EOMs intact Oropharynx clear and moist No cervical or supraclavicular adenopathy Lungs no rales or  rhonchi Heart regular rate and rhythm Abd soft, nontender, positive bowel sounds MSK no focal spinal tenderness, no upper extremity lymphedema Neuro: nonfocal, well oriented, appropriate affect Breasts: The right breast is unremarkable.  The left breast is status post mastectomy.  There is no evidence of local recurrence. Skin: The rash over the face abdomen and back is almost completely resolved.  The pustular lower extremity lesions are scabbing over.  She still has a minimal rash in the anterior upper chest.  Rash 06/10/2018     LAB RESULTS:  CMP     Component Value Date/Time   NA 135 (L) 06/10/2018 1219   K 4.5 06/10/2018 1219   CL 101 06/10/2018 1219   CO2 27 06/10/2018 1219   GLUCOSE 85 06/10/2018 1219   BUN 9 06/10/2018 1219   CREATININE 0.79 06/10/2018 1219   CALCIUM 10.2 06/10/2018 1219   PROT 7.0 06/10/2018 1219   ALBUMIN 4.2 06/10/2018 1219   AST 18 06/10/2018 1219   ALT 22 06/10/2018 1219   ALKPHOS 156 (H) 06/10/2018 1219   BILITOT 0.2 06/10/2018 1219   GFRNONAA >60 06/10/2018 1219   GFRAA >60 06/10/2018 1219    No results found for: TOTALPROTELP, ALBUMINELP, A1GS, A2GS, BETS, BETA2SER, GAMS, MSPIKE, SPEI  No results found for: KPAFRELGTCHN, LAMBDASER, KAPLAMBRATIO  Lab Results  Component Value Date   WBC 24.8 (H) 06/10/2018   NEUTROABS 18.2 (H) 06/10/2018   HGB 12.8 06/10/2018   HCT 39.1 06/10/2018   MCV 91.9 06/10/2018   PLT 134 (L) 06/10/2018    '@LASTCHEMISTRY' @  No results found for: LABCA2  No components found for: [id number]  No results for input(s): INR in the last 168 hours.  No results found for: LABCA2  No results found for: WUJ811  No results found for: BJY782  No results found for: NFA213  No results found for: CA2729  No components found for: HGQUANT  No results found for: CEA1 / No results found for: CEA1   No results found for: AFPTUMOR  No results found for: [place]  No results found for: PSA1  No visits with  results within 3 Day(s)  from this visit.  Latest known visit with results is:  Appointment on 06/10/2018  Component Date Value Ref Range Status  . Sodium 06/10/2018 135* 136 - 145 mmol/L Final  . Potassium 06/10/2018 4.5  3.5 - 5.1 mmol/L Final  . Chloride 06/10/2018 101  98 - 109 mmol/L Final  . CO2 06/10/2018 27  22 - 29 mmol/L Final  . Glucose, Bld 06/10/2018 85  70 - 140 mg/dL Final  . BUN 06/10/2018 9  7 - 26 mg/dL Final  . Creatinine, Ser 06/10/2018 0.79  0.60 - 1.10 mg/dL Final  . Calcium 06/10/2018 10.2  8.4 - 10.4 mg/dL Final  . Total Protein 06/10/2018 7.0  6.4 - 8.3 g/dL Final  . Albumin 06/10/2018 4.2  3.5 - 5.0 g/dL Final  . AST 06/10/2018 18  5 - 34 U/L Final  . ALT 06/10/2018 22  0 - 55 U/L Final  . Alkaline Phosphatase 06/10/2018 156* 40 - 150 U/L Final  . Total Bilirubin 06/10/2018 0.2  0.2 - 1.2 mg/dL Final  . GFR calc non Af Amer 06/10/2018 >60  >60 mL/min Final  . GFR calc Af Amer 06/10/2018 >60  >60 mL/min Final   Comment: (NOTE) The eGFR has been calculated using the CKD EPI equation. This calculation has not been validated in all clinical situations. eGFR's persistently <60 mL/min signify possible Chronic Kidney Disease.   Georgiann Hahn gap 06/10/2018 7  3 - 11 Final   Performed at [facility], [place] [address], [place], [place] [address]  . WBC 06/10/2018 24.8* 3.9 - 10.3 K/uL Final  . RBC 06/10/2018 4.26  3.70 - 5.45 MIL/uL Final  . Hemoglobin 06/10/2018 12.8  11.6 - 15.9 g/dL Final  . HCT 06/10/2018 39.1  34.8 - 46.6 % Final  . MCV 06/10/2018 91.9  79.5 - 101.0 fL Final  . MCH 06/10/2018 30.0  25.1 - 34.0 pg Final  . MCHC 06/10/2018 32.7  31.5 - 36.0 g/dL Final  . RDW 06/10/2018 13.1  11.2 - 14.5 % Final  . Platelets 06/10/2018 134* 145 - 400 K/uL Final  . Neutrophils Relative % 06/10/2018 74  % Final  . Neutro Abs 06/10/2018 18.2* 1.5 - 6.5 K/uL Final  . Lymphocytes Relative 06/10/2018 15  % Final  . Lymphs Abs 06/10/2018 3.8* 0.9 -  3.3 K/uL Final  . Monocytes Relative 06/10/2018 11  % Final  . Monocytes Absolute 06/10/2018 2.7* 0.1 - 0.9 K/uL Final  . Eosinophils Relative 06/10/2018 0  % Final  . Eosinophils Absolute 06/10/2018 0.1  0.0 - 0.5 K/uL Final  . Basophils Relative 06/10/2018 0  % Final  . Basophils Absolute 06/10/2018 0.1  0.0 - 0.1 K/uL Final   Performed at [facility], [place] [address] [place], [place] [address]    (this displays the last labs from the last 3 days)  No results found for: TOTALPROTELP, ALBUMINELP, A1GS, A2GS, BETS, BETA2SER, GAMS, MSPIKE, SPEI (this displays SPEP labs)  No results found for: KPAFRELGTCHN, LAMBDASER, KAPLAMBRATIO (kappa/lambda light chains)  No results found for: HGBA, HGBA2QUANT, HGBFQUANT, HGBSQUAN (Hemoglobinopathy evaluation)   No results found for: LDH  No results found for: IRON, TIBC, IRONPCTSAT (Iron and TIBC)  No results found for: FERRITIN  Urinalysis No results found for: COLORURINE, APPEARANCEUR, LABSPEC, PHURINE, GLUCOSEU, HGBUR, BILIRUBINUR, KETONESUR, PROTEINUR, UROBILINOGEN, NITRITE, LEUKOCYTESUR   STUDIES: No results found.  ELIGIBLE FOR AVAILABLE RESEARCH PROTOCOL: not discussed  ASSESSMENT: 52 y.o. [place], [place] woman status post left  mastectomy and sentinel lymph node sampling 04/02/2018 for a pT2 pN1, stage IIB invasive ductal carcinoma, grade 3, estrogen and progesterone receptor negative, but HER-2 amplified  (1) genetics testing 06/15/2018  (2) adjuvant chemotherapy consisting of carboplatin and docetaxel given every 21 days x 6, started 05/13/2018  (3) trastuzumab and Pertuzumab will started 05/13/2018 with chemotherapy and continue for 6 months  (a) echocardiogram 05/06/2018 shows an ejection fraction in the 65-70%  (4) adjuvant radiation to follow  (5) referral to plastics placed 05/03/2018    PLAN: Davia is tolerating her chemotherapy remarkably well except for the skin reaction.  It is not  clear to me what agent is causing this although we will suspect epratuzumab.  She is scheduled for cycle 3 on 06/24/2018.  The plan at this point is to do chemotherapy only on that date.  If she has no rash, then we will do trastuzumab alone on 06/30/2018.  If she has no rash then we will continue trastuzumab and chemotherapy but omit the Pertuzumab.  Obviously if she has a rash to any of the agents listed we will change the agents  I think it would be prudent to continue the doxycycline at least until we are sure she will not have a rash from the cycle.  I have put in that order.  I have canceled her appointment with me on 06 27 since she saw me today.  If she has any questions or concerns she will ask me to drop by in the treatment area that day  I have asked her to make sure to call us with any problems that may develop before the next visit.   Trevion Hoben, Virgie Dad, MD  06/21/18 12:44 PM Medical Oncology and Hematology [facility] [address] [place],  [address] Tel. [phone]    Fax. [phone]  Alice Rieger, am acting as scribe for Chauncey Cruel MD.  I, Lurline Del MD, have reviewed the above documentation for accuracy and completeness, and I agree with the above.

## 2018-06-21 ENCOUNTER — Inpatient Hospital Stay (HOSPITAL_BASED_OUTPATIENT_CLINIC_OR_DEPARTMENT_OTHER): Payer: BC Managed Care – PPO | Admitting: Oncology

## 2018-06-21 DIAGNOSIS — T50905A Adverse effect of unspecified drugs, medicaments and biological substances, initial encounter: Secondary | ICD-10-CM | POA: Diagnosis not present

## 2018-06-21 DIAGNOSIS — L270 Generalized skin eruption due to drugs and medicaments taken internally: Secondary | ICD-10-CM

## 2018-06-21 DIAGNOSIS — Z803 Family history of malignant neoplasm of breast: Secondary | ICD-10-CM

## 2018-06-21 DIAGNOSIS — C50512 Malignant neoplasm of lower-outer quadrant of left female breast: Secondary | ICD-10-CM | POA: Diagnosis not present

## 2018-06-21 DIAGNOSIS — Z9012 Acquired absence of left breast and nipple: Secondary | ICD-10-CM

## 2018-06-21 DIAGNOSIS — Z171 Estrogen receptor negative status [ER-]: Secondary | ICD-10-CM

## 2018-06-21 DIAGNOSIS — Z5111 Encounter for antineoplastic chemotherapy: Secondary | ICD-10-CM | POA: Diagnosis not present

## 2018-06-21 DIAGNOSIS — Z800 Family history of malignant neoplasm of digestive organs: Secondary | ICD-10-CM | POA: Diagnosis not present

## 2018-06-21 DIAGNOSIS — Z801 Family history of malignant neoplasm of trachea, bronchus and lung: Secondary | ICD-10-CM

## 2018-06-21 MED ORDER — DOXYCYCLINE HYCLATE 100 MG PO TABS: 100.0000 mg | ORAL_TABLET | Freq: Every day | ORAL | 1 refills | Status: DC

## 2018-06-24 ENCOUNTER — Inpatient Hospital Stay: Payer: BC Managed Care – PPO

## 2018-06-24 ENCOUNTER — Other Ambulatory Visit: Payer: BC Managed Care – PPO

## 2018-06-24 ENCOUNTER — Inpatient Hospital Stay: Payer: BC Managed Care – PPO | Admitting: Oncology

## 2018-06-24 ENCOUNTER — Telehealth: Payer: Self-pay | Admitting: *Deleted

## 2018-06-24 ENCOUNTER — Other Ambulatory Visit: Payer: Self-pay | Admitting: Oncology

## 2018-06-24 VITALS — BP 126/73 | HR 80 | Temp 98.2°F | Resp 16

## 2018-06-24 DIAGNOSIS — Z171 Estrogen receptor negative status [ER-]: Principal | ICD-10-CM

## 2018-06-24 DIAGNOSIS — C50512 Malignant neoplasm of lower-outer quadrant of left female breast: Secondary | ICD-10-CM

## 2018-06-24 DIAGNOSIS — Z5111 Encounter for antineoplastic chemotherapy: Secondary | ICD-10-CM | POA: Diagnosis not present

## 2018-06-24 LAB — CBC WITH DIFFERENTIAL/PLATELET
Basophils Absolute: 0 10*3/uL (ref 0.0–0.1)
Basophils Relative: 1 %
EOS ABS: 0 10*3/uL (ref 0.0–0.5)
EOS PCT: 0 %
HCT: 32.3 % — ABNORMAL LOW (ref 34.8–46.6)
Hemoglobin: 11.1 g/dL — ABNORMAL LOW (ref 11.6–15.9)
LYMPHS ABS: 0.9 10*3/uL (ref 0.9–3.3)
Lymphocytes Relative: 12 %
MCH: 31.3 pg (ref 25.1–34.0)
MCHC: 34.4 g/dL (ref 31.5–36.0)
MCV: 91 fL (ref 79.5–101.0)
MONO ABS: 0.4 10*3/uL (ref 0.1–0.9)
MONOS PCT: 6 %
Neutro Abs: 6.6 10*3/uL — ABNORMAL HIGH (ref 1.5–6.5)
Neutrophils Relative %: 81 %
PLATELETS: 242 10*3/uL (ref 145–400)
RBC: 3.55 MIL/uL — AB (ref 3.70–5.45)
RDW: 14.1 % (ref 11.2–14.5)
WBC: 7.9 10*3/uL (ref 3.9–10.3)

## 2018-06-24 LAB — COMPREHENSIVE METABOLIC PANEL
ALBUMIN: 4 g/dL (ref 3.5–5.0)
ALT: 20 U/L (ref 0–44)
AST: 16 U/L (ref 15–41)
Alkaline Phosphatase: 98 U/L (ref 38–126)
Anion gap: 8 (ref 5–15)
BUN: 10 mg/dL (ref 6–20)
CHLORIDE: 106 mmol/L (ref 98–111)
CO2: 26 mmol/L (ref 22–32)
CREATININE: 0.66 mg/dL (ref 0.44–1.00)
Calcium: 10 mg/dL (ref 8.9–10.3)
GFR calc Af Amer: >60 mL/min (ref >60)
GFR calc non Af Amer: >60 mL/min (ref >60)
GLUCOSE: 135 mg/dL — AB (ref 70–99)
POTASSIUM: 3.6 mmol/L (ref 3.5–5.1)
Sodium: 140 mmol/L (ref 135–145)
Total Bilirubin: 0.3 mg/dL (ref 0.3–1.2)
Total Protein: 6.7 g/dL (ref 6.5–8.1)

## 2018-06-24 MED ORDER — TRASTUZUMAB CHEMO 150 MG IV SOLR: 6.0000 mg/kg | Freq: Once | INTRAVENOUS | Status: DC

## 2018-06-24 MED ORDER — PALONOSETRON HCL INJECTION 0.25 MG/5ML
INTRAVENOUS | Status: AC
Start: 2018-06-24 — End: ?
  Filled 2018-06-24: qty 5

## 2018-06-24 MED ORDER — DIPHENHYDRAMINE HCL 25 MG PO CAPS
ORAL_CAPSULE | ORAL | Status: AC
  Filled 2018-06-24: qty 2

## 2018-06-24 MED ORDER — SODIUM CHLORIDE 0.9% FLUSH
10.0000 mL | INTRAVENOUS | Status: DC | PRN
  Administered 2018-06-24: 10 mL
  Filled 2018-06-24: qty 10

## 2018-06-24 MED ORDER — DIPHENHYDRAMINE HCL 50 MG/ML IJ SOLN
25.0000 mg | Freq: Once | INTRAMUSCULAR | Status: AC
  Administered 2018-06-24: 25 mg via INTRAVENOUS

## 2018-06-24 MED ORDER — SODIUM CHLORIDE 0.9 % IV SOLN
Freq: Once | INTRAVENOUS | Status: AC
  Administered 2018-06-24: 11:00:00 via INTRAVENOUS

## 2018-06-24 MED ORDER — PALONOSETRON HCL INJECTION 0.25 MG/5ML
0.2500 mg | Freq: Once | INTRAVENOUS | Status: AC
  Administered 2018-06-24: 0.25 mg via INTRAVENOUS

## 2018-06-24 MED ORDER — DIPHENHYDRAMINE HCL 50 MG/ML IJ SOLN
INTRAMUSCULAR | Status: AC
  Filled 2018-06-24: qty 1

## 2018-06-24 MED ORDER — ACETAMINOPHEN 325 MG PO TABS: 650.0000 mg | ORAL_TABLET | Freq: Once | ORAL | Status: DC

## 2018-06-24 MED ORDER — FOSAPREPITANT DIMEGLUMINE INJECTION 150 MG
Freq: Once | INTRAVENOUS | Status: AC
  Administered 2018-06-24: 11:00:00 via INTRAVENOUS
  Filled 2018-06-24: qty 5

## 2018-06-24 MED ORDER — SODIUM CHLORIDE 0.9 % IV SOLN
498.0000 mg | Freq: Once | INTRAVENOUS | Status: AC
  Administered 2018-06-24: 500 mg via INTRAVENOUS
  Filled 2018-06-24: qty 50

## 2018-06-24 MED ORDER — ACETAMINOPHEN 325 MG PO TABS
ORAL_TABLET | ORAL | Status: AC
Start: 2018-06-24 — End: ?
  Filled 2018-06-24: qty 2

## 2018-06-24 MED ORDER — DIPHENHYDRAMINE HCL 25 MG PO CAPS: 50.0000 mg | ORAL_CAPSULE | Freq: Once | ORAL | Status: DC

## 2018-06-24 MED ORDER — HEPARIN SOD (PORK) LOCK FLUSH 100 UNIT/ML IV SOLN
500.0000 [IU] | Freq: Once | INTRAVENOUS | Status: AC | PRN
  Administered 2018-06-24: 500 [IU]
  Filled 2018-06-24: qty 5

## 2018-06-24 MED ORDER — DOCETAXEL CHEMO INJECTION 160 MG/16ML
75.0000 mg/m2 | Freq: Once | INTRAVENOUS | Status: AC
  Administered 2018-06-24: 120 mg via INTRAVENOUS
  Filled 2018-06-24: qty 12

## 2018-06-24 MED ORDER — SODIUM CHLORIDE 0.9 % IV SOLN: 420.0000 mg | Freq: Once | INTRAVENOUS | Status: DC

## 2018-06-24 NOTE — Patient Instructions (Addendum)
[facility] Discharge Instructions for Patients Receiving Chemotherapy  Today you received the following chemotherapy agents: Taxotere, Carboplatin  To help prevent nausea and vomiting after your treatment, we encourage you to take your nausea medication as prescribed.   If you develop nausea and vomiting that is not controlled by your nausea medication, call the clinic.   BELOW ARE SYMPTOMS THAT SHOULD BE REPORTED IMMEDIATELY:  *FEVER GREATER THAN 100.5 F  *CHILLS WITH OR WITHOUT FEVER  NAUSEA AND VOMITING THAT IS NOT CONTROLLED WITH YOUR NAUSEA MEDICATION  *UNUSUAL SHORTNESS OF BREATH  *UNUSUAL BRUISING OR BLEEDING  TENDERNESS IN MOUTH AND THROAT WITH OR WITHOUT PRESENCE OF ULCERS  *URINARY PROBLEMS  *BOWEL PROBLEMS  UNUSUAL RASH Items with * indicate a potential emergency and should be followed up as soon as possible.  Feel free to call the clinic should you have any questions or concerns. The clinic phone number is [phone].  Please show the [facility] at check-in to the Emergency Department and triage nurse.

## 2018-06-24 NOTE — Progress Notes (Signed)
06/24/18 @ 1020  Confirmed treatment today as only chemotherapy - Docetaxel and Carboplatin, NO Herceptin or Perjeta.  Released Herceptin and Perjeta were rejected in queue as well as pre-medications for Herceptin/Perjeta.  RN notified in treatment room.  T.O. Dr Magrinat/Val RN/Lillee Mooneyhan Ronnald Ramp, PharmD

## 2018-06-24 NOTE — Telephone Encounter (Signed)
Verified with MD per call from pharmacy - pt is to receive chemo ( carbo/taxotere ) only today due to noted rash - perjeta and herceptin to be given next week post visit for evaluation.

## 2018-06-26 ENCOUNTER — Inpatient Hospital Stay: Payer: BC Managed Care – PPO

## 2018-06-26 VITALS — BP 142/87 | HR 58 | Temp 98.0°F | Resp 16

## 2018-06-26 DIAGNOSIS — Z171 Estrogen receptor negative status [ER-]: Principal | ICD-10-CM

## 2018-06-26 DIAGNOSIS — Z5111 Encounter for antineoplastic chemotherapy: Secondary | ICD-10-CM | POA: Diagnosis not present

## 2018-06-26 DIAGNOSIS — C50512 Malignant neoplasm of lower-outer quadrant of left female breast: Secondary | ICD-10-CM

## 2018-06-26 MED ORDER — PEGFILGRASTIM-CBQV 6 MG/0.6ML ~~LOC~~ SOSY
6.0000 mg | PREFILLED_SYRINGE | Freq: Once | SUBCUTANEOUS | Status: AC
  Administered 2018-06-26: 6 mg via SUBCUTANEOUS

## 2018-06-26 NOTE — Patient Instructions (Signed)
Pegfilgrastim injection What is this medicine? PEGFILGRASTIM (PEG fil gra stim) is a long-acting granulocyte colony-stimulating factor that stimulates the growth of neutrophils, a type of white blood cell important in the body's fight against infection. It is used to reduce the incidence of fever and infection in patients with certain types of cancer who are receiving chemotherapy that affects the bone marrow, and to increase survival after being exposed to high doses of radiation. This medicine may be used for other purposes; ask your health care provider or pharmacist if you have questions. COMMON BRAND NAME(S): Neulasta What should I tell my health care provider before I take this medicine? They need to know if you have any of these conditions: -kidney disease -latex allergy -ongoing radiation therapy -sickle cell disease -skin reactions to acrylic adhesives (On-Body Injector only) -an unusual or allergic reaction to pegfilgrastim, filgrastim, other medicines, foods, dyes, or preservatives -pregnant or trying to get pregnant -breast-feeding How should I use this medicine? This medicine is for injection under the skin. If you get this medicine at home, you will be taught how to prepare and give the pre-filled syringe or how to use the On-body Injector. Refer to the patient Instructions for Use for detailed instructions. Use exactly as directed. Tell your healthcare provider immediately if you suspect that the On-body Injector may not have performed as intended or if you suspect the use of the On-body Injector resulted in a missed or partial dose. It is important that you put your used needles and syringes in a special sharps container. Do not put them in a trash can. If you do not have a sharps container, call your pharmacist or healthcare provider to get one. Talk to your pediatrician regarding the use of this medicine in children. While this drug may be prescribed for selected conditions,  precautions do apply. Overdosage: If you think you have taken too much of this medicine contact a poison control center or emergency room at once. NOTE: This medicine is only for you. Do not share this medicine with others. What if I miss a dose? It is important not to miss your dose. Call your doctor or health care professional if you miss your dose. If you miss a dose due to an On-body Injector failure or leakage, a new dose should be administered as soon as possible using a single prefilled syringe for manual use. What may interact with this medicine? Interactions have not been studied. Give your health care provider a list of all the medicines, herbs, non-prescription drugs, or dietary supplements you use. Also tell them if you smoke, drink alcohol, or use illegal drugs. Some items may interact with your medicine. This list may not describe all possible interactions. Give your health care provider a list of all the medicines, herbs, non-prescription drugs, or dietary supplements you use. Also tell them if you smoke, drink alcohol, or use illegal drugs. Some items may interact with your medicine. What should I watch for while using this medicine? You may need blood work done while you are taking this medicine. If you are going to need a MRI, CT scan, or other procedure, tell your doctor that you are using this medicine (On-Body Injector only). What side effects may I notice from receiving this medicine? Side effects that you should report to your doctor or health care professional as soon as possible: -allergic reactions like skin rash, itching or hives, swelling of the face, lips, or tongue -dizziness -fever -pain, redness, or irritation at site   where injected -pinpoint red spots on the skin -red or dark-brown urine -shortness of breath or breathing problems -stomach or side pain, or pain at the shoulder -swelling -tiredness -trouble passing urine or change in the amount of urine Side  effects that usually do not require medical attention (report to your doctor or health care professional if they continue or are bothersome): -bone pain -muscle pain This list may not describe all possible side effects. Call your doctor for medical advice about side effects. You may report side effects to FDA at 1-800-FDA-1088. Where should I keep my medicine? Keep out of the reach of children. Store pre-filled syringes in a refrigerator between 2 and 8 degrees C (36 and 46 degrees F). Do not freeze. Keep in carton to protect from light. Throw away this medicine if it is left out of the refrigerator for more than 48 hours. Throw away any unused medicine after the expiration date. NOTE: This sheet is a summary. It may not cover all possible information. If you have questions about this medicine, talk to your doctor, pharmacist, or health care provider.  2018 Elsevier/Gold Standard (2016-12-11 12:58:03)  

## 2018-06-29 NOTE — Progress Notes (Signed)
[place]  [phone] [phone]     ID: Monica Padilla DOB: 08-21-1966  MR#: [id number]  XBD#:[id number]  Patient Care Team: Algis Greenhouse, MD as PCP - General (Family Medicine) Signe Colt, MD as Referring Physician (Obstetrics and Gynecology) Magrinat, Virgie Dad, MD as Consulting Physician (Oncology) Larey Dresser, MD as Consulting Physician (Cardiology) Noberto Retort Juanda Bond., MD as Referring Physician (Surgery) Gatha Mayer, MD as Consulting Physician (Gastroenterology) Nolon Nations, MD as Consulting Physician (Diagnostic Radiology) OTHER MD: Dr. Jimmye Norman and Dr. Michele Mcalpine at [place]: Estrogen receptor negative, HER-2 positive breast cancer  CURRENT TREATMENT: Chemo/immunotherapy  INTERVAL HISTORY: Monica Padilla returns today for a follow-up and treatment of her estrogen receptor negative, HER-2 positive breast cancer accompanied by her husband. She receives Carboplatin and docetaxel, trastuzumab, and pertuzumab every 21 days. Today is day 7 cycle 4 of her chemotherapy. The trastuzumab and pertuzumab were held during cycle 4 due to skin rash. The plan was to receive trastuzumab alone today, but she notes that she was scheduled for 07/03/2018 as there was no availability today.  She notes that the rash is now gone. She is no longer having diarrhea since holding the pertuzumab. She has had less fatigue and more energy. She denies having any nausea.    REVIEW OF SYSTEMS: Monica Padilla reports that they are going to [place] for the 4th of July.  She denies unusual headaches, visual changes, nausea, vomiting, or dizziness. There has been no unusual cough, phlegm production, or pleurisy. This been no change in bowel or bladder habits. She denies unexplained fatigue or unexplained weight loss, bleeding, rash, or fever. A detailed review of systems was otherwise stable.    HISTORY OF CURRENT ILLNESS: From the  original intake note:  The patient noted a palpable mass in her left breast as she was laying down. She brought this to medical attention immediately and had left diagnostic mammography with tomography at breast center  03/11/2018 showing a 2.5 cm palpable mass in the lower outer quadrant of the left breast, with one slightly prominent lymph node that had increased cortical measurement.  Biopsy of this left breast mass 03/17/2018 showed ([id number]), invasive ductal carcinoma, grade 2, estrogen and progesterone receptor negative, with an MIB-1 of 80%, but HER-2 amplified with a signals ratio 5.51 and the number per cell 17.35.  She underwent left mastectomy and sentinel lymph node sampling, as well as right-sided port placement 04/02/2018 under Etheleen Mayhew at [facility] for a 2.6 cm invasive ductal carcinoma, grade 3, with 1 of 3 sentinel nodes showing a micrometastatic deposit, but all margins negative.  She had a port placed at the same time.  The patient's subsequent history is as detailed below.    PAST MEDICAL HISTORY: Past Medical History:  Diagnosis Date  . Breast cancer ([facility])   . Cancer ([facility])    basal cell CA- chest, leg  . Family history of breast cancer   . Family history of colon cancer   . Family history of lung cancer   . GERD (gastroesophageal reflux disease)   s.   PAST SURGICAL HISTORY: Past Surgical History:  Procedure Laterality Date  . PARTIAL HYSTERECTOMY    . SKIN CANCER EXCISION      FAMILY HISTORY Family History  Problem Relation Age of Onset  . Colon cancer Father 21  . Basal cell carcinoma Sister        2-3  . Lung cancer Maternal Aunt  hx smoking  . Lung cancer Paternal Uncle        hx smoking  . Heart disease Maternal Grandmother   . Heart disease Maternal Grandfather   . Cancer Paternal Grandfather        unk if colon or stomach? died in early 46's.   . Esophageal cancer Neg Hx   . Stomach cancer Neg Hx   . Rectal cancer Neg Hx     The patient's father died due to colon cancer at age 37. The patient's mother is alive at age 51. The patient has 1 sister and no brothers. A paternal cousin was diagnosed with breast cancer in the early 81's (before 13). The patient's paternal grandfather passed away from colon cancer. The patient's father had 3 brothers, one of whom had lung cancer.   GYNECOLOGIC HISTORY:  No LMP recorded. Patient has had a hysterectomy. Menarche: 52 years old Age at first live birth: 52 years old She is GXP2.  The patient is status post partial hysterectomy due to uterine fibroids. She retains her cervix and ovaries.  After her hysterectomy she used Estradiol with no complications.  She stopped hormone replacement March 2019.  She is now having hot flashes and nigh sweats that only allow her to sleep 3 hours at a time.   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 Her husband, [name], is a Hydrologist for alarms and cameras for [employer]. The patient's oldest son, [name], is 67 and works as a Civil engineer, contracting in [place]. The patient's son, [name], is 72 an works for a Dispensing optician. Monica Padilla lives at home with the patient. Monica Padilla does not have any grandchildren. She attends [employer].     ADVANCED DIRECTIVES:    HEALTH MAINTENANCE: Social History   Tobacco Use  . Smoking status: Never Smoker  . Smokeless tobacco: Never Used  Substance Use Topics  . Alcohol use: Yes    Comment: occasional  . Drug use: No     Colonoscopy: 10/04/2013/ Dr. Carlean Purl / polyp removal  PAP:  Bone density: remote   Allergies  Allergen Reactions  . Codeine Itching  . Nitrofurantoin     REACTION: pruritis  . Amoxicillin Rash    Current Outpatient Medications  Medication Sig Dispense Refill  . Ascorbic Acid (VITAMIN C PO) Take 500 mg by mouth daily. With rose hips    . Cholecalciferol (VITAMIN D-1000 MAX ST) 1000 units tablet Take by mouth.    .  CRANBERRY PO Take by mouth daily.    Marland Kitchen dexamethasone (DECADRON) 4 MG tablet Take 2 tablets (8 mg total) by mouth 2 (two) times daily. Start the day before Taxotere. Take once the day after, then 2 times a day x 2d. 30 tablet 1  . doxycycline (VIBRA-TABS) 100 MG tablet Take 1 tablet (100 mg total) by mouth daily. 20 tablet 1  . doxycycline (VIBRAMYCIN) 100 MG capsule Take 1 capsule (100 mg total) by mouth 2 (two) times daily. 20 capsule 0  . gabapentin (NEURONTIN) 300 MG capsule Take 1 capsule (300 mg total) by mouth at bedtime. 90 capsule 4  . lidocaine-prilocaine (EMLA) cream Apply to affected area once 30 g 3  . LORazepam (ATIVAN) 0.5 MG tablet Take 1 tablet (0.5 mg total) by mouth at bedtime as needed for anxiety. 30 tablet 1  . methylPREDNISolone (MEDROL DOSEPAK) 4 MG TBPK tablet 6 x 1 day, 5 x 1 day, 4 x 1 day, 3  x 1 day, 2 x 1 day, 1 x 1 day 21 tablet 0  . pantoprazole (PROTONIX) 40 MG tablet Take by mouth.    . predniSONE (DELTASONE) 5 MG tablet 6 tab x 1 day, 5 tab x 1 day, 4 tab x 1 day, 3 tab x 1 day, 2 tab x 1 day, 1 tab x 1 day, stop 21 tablet 0  . prochlorperazine (COMPAZINE) 10 MG tablet Take 1 tablet (10 mg total) by mouth every 6 (six) hours as needed (Nausea or vomiting). 30 tablet 1  . triamcinolone lotion (KENALOG) 0.1 % Apply 1 application topically 3 (three) times daily. 120 mL 1   No current facility-administered medications for this visit.     OBJECTIVE: Young appearing white woman no acute distress  Vitals:   06/30/18 0811  BP: 122/68  Pulse: 79  Resp: 18  Temp: 98.5 F (36.9 C)  SpO2: 100%     Body mass index is 23.6 kg/m.   Wt Readings from Last 3 Encounters:  06/30/18 124 lb 14.4 oz (56.7 kg)  06/14/18 126 lb 12.8 oz (57.5 kg)  06/10/18 124 lb 4.8 oz (56.4 kg)      ECOG FS:1   Sclerae unicteric, pupils round and equal Oropharynx clear and moist No cervical or supraclavicular adenopathy Lungs no rales or rhonchi Heart regular rate and rhythm Abd  soft, nontender, positive bowel sounds MSK no focal spinal tenderness, no upper extremity lymphedema Neuro: nonfocal, well oriented, appropriate affect Breasts: The right breast is benign.  The left breast is status post mastectomy.  There is no evidence of local recurrence.  Both axillae are benign Skin: There are still a few lesions in the middle upper chest, residual from the last rash event.  These are crusting over    LAB RESULTS:  CMP     Component Value Date/Time   NA 140 06/24/2018 0855   K 3.6 06/24/2018 0855   CL 106 06/24/2018 0855   CO2 26 06/24/2018 0855   GLUCOSE 135 (H) 06/24/2018 0855   BUN 10 06/24/2018 0855   CREATININE 0.66 06/24/2018 0855   CALCIUM 10.0 06/24/2018 0855   PROT 6.7 06/24/2018 0855   ALBUMIN 4.0 06/24/2018 0855   AST 16 06/24/2018 0855   ALT 20 06/24/2018 0855   ALKPHOS 98 06/24/2018 0855   BILITOT 0.3 06/24/2018 0855   GFRNONAA >60 06/24/2018 0855   GFRAA >60 06/24/2018 0855    No results found for: TOTALPROTELP, ALBUMINELP, A1GS, A2GS, BETS, BETA2SER, GAMS, MSPIKE, SPEI  No results found for: KPAFRELGTCHN, LAMBDASER, KAPLAMBRATIO  Lab Results  Component Value Date   WBC 7.9 06/24/2018   NEUTROABS 6.6 (H) 06/24/2018   HGB 11.1 (L) 06/24/2018   HCT 32.3 (L) 06/24/2018   MCV 91.0 06/24/2018   PLT 242 06/24/2018    '@LASTCHEMISTRY' @  No results found for: LABCA2  No components found for: OYDXAJ287  No results for input(s): INR in the last 168 hours.  No results found for: LABCA2  No results found for: OMV672  No results found for: CNO709  No results found for: GGE366  No results found for: CA2729  No components found for: HGQUANT  No results found for: CEA1 / No results found for: CEA1   No results found for: AFPTUMOR  No results found for: CHROMOGRNA  No results found for: PSA1  No visits with results within 3 Day(s) from this visit.  Latest known visit with results is:  Appointment on 06/24/2018  Component  Date  Value Ref Range Status  . Sodium 06/24/2018 140  135 - 145 mmol/L Final   Please note reference intervals were recently updated.  . Potassium 06/24/2018 3.6  3.5 - 5.1 mmol/L Final  . Chloride 06/24/2018 106  98 - 111 mmol/L Final  . CO2 06/24/2018 26  22 - 32 mmol/L Final  . Glucose, Bld 06/24/2018 135* 70 - 99 mg/dL Final  . BUN 06/24/2018 10  6 - 20 mg/dL Final   Please note change in reference range.  . Creatinine, Ser 06/24/2018 0.66  0.44 - 1.00 mg/dL Final  . Calcium 06/24/2018 10.0  8.9 - 10.3 mg/dL Final  . Total Protein 06/24/2018 6.7  6.5 - 8.1 g/dL Final  . Albumin 06/24/2018 4.0  3.5 - 5.0 g/dL Final  . AST 06/24/2018 16  15 - 41 U/L Final  . ALT 06/24/2018 20  0 - 44 U/L Final  . Alkaline Phosphatase 06/24/2018 98  38 - 126 U/L Final  . Total Bilirubin 06/24/2018 0.3  0.3 - 1.2 mg/dL Final  . GFR calc non Af Amer 06/24/2018 >60  >60 mL/min Final  . GFR calc Af Amer 06/24/2018 >60  >60 mL/min Final   Comment: (NOTE) The eGFR has been calculated using the CKD EPI equation. This calculation has not been validated in all clinical situations. eGFR's persistently <60 mL/min signify possible Chronic Kidney Disease.   Georgiann Hahn gap 06/24/2018 8  5 - 15 Final   Performed at [facility], [place] [address], [place], [place] [address]  . WBC 06/24/2018 7.9  3.9 - 10.3 K/uL Final  . RBC 06/24/2018 3.55* 3.70 - 5.45 MIL/uL Final  . Hemoglobin 06/24/2018 11.1* 11.6 - 15.9 g/dL Final  . HCT 06/24/2018 32.3* 34.8 - 46.6 % Final  . MCV 06/24/2018 91.0  79.5 - 101.0 fL Final  . MCH 06/24/2018 31.3  25.1 - 34.0 pg Final  . MCHC 06/24/2018 34.4  31.5 - 36.0 g/dL Final  . RDW 06/24/2018 14.1  11.2 - 14.5 % Final  . Platelets 06/24/2018 242  145 - 400 K/uL Final  . Neutrophils Relative % 06/24/2018 81  % Final  . Neutro Abs 06/24/2018 6.6* 1.5 - 6.5 K/uL Final  . Lymphocytes Relative 06/24/2018 12  % Final  . Lymphs Abs 06/24/2018 0.9  0.9 - 3.3 K/uL Final  .  Monocytes Relative 06/24/2018 6  % Final  . Monocytes Absolute 06/24/2018 0.4  0.1 - 0.9 K/uL Final  . Eosinophils Relative 06/24/2018 0  % Final  . Eosinophils Absolute 06/24/2018 0.0  0.0 - 0.5 K/uL Final  . Basophils Relative 06/24/2018 1  % Final  . Basophils Absolute 06/24/2018 0.0  0.0 - 0.1 K/uL Final   Performed at [facility], [place] [address] [place], [place] [address]    (this displays the last labs from the last 3 days)  No results found for: TOTALPROTELP, ALBUMINELP, A1GS, A2GS, BETS, BETA2SER, GAMS, MSPIKE, SPEI (this displays SPEP labs)  No results found for: KPAFRELGTCHN, LAMBDASER, KAPLAMBRATIO (kappa/lambda light chains)  No results found for: HGBA, HGBA2QUANT, HGBFQUANT, HGBSQUAN (Hemoglobinopathy evaluation)   No results found for: LDH  No results found for: IRON, TIBC, IRONPCTSAT (Iron and TIBC)  No results found for: FERRITIN  Urinalysis No results found for: COLORURINE, APPEARANCEUR, LABSPEC, PHURINE, GLUCOSEU, HGBUR, BILIRUBINUR, KETONESUR, PROTEINUR, UROBILINOGEN, NITRITE, LEUKOCYTESUR   STUDIES: No results found.  ELIGIBLE FOR AVAILABLE RESEARCH PROTOCOL: not discussed  ASSESSMENT: 52 y.o. [place], [place] woman status  post left mastectomy and sentinel lymph node sampling 04/02/2018 for a pT2 pN1, stage IIB invasive ductal carcinoma, grade 3, estrogen and progesterone receptor negative, but HER-2 amplified  (1) genetics testing 06/15/2018  (2) adjuvant chemotherapy consisting of carboplatin and docetaxel given every 21 days x 6, started 05/13/2018  (3) trastuzumab and Pertuzumab will started 05/13/2018 with chemotherapy and continue for 6 months  (a) echocardiogram 05/06/2018 shows an ejection fraction in the 65-70%  (4) adjuvant radiation to follow  (5) referral to plastics placed 05/03/2018    PLAN: Monica Padilla did well with her fourth cycle of chemotherapy, and specifically has not developed a rash.  That means the rash  is going to be due to 1 of the antibodies.  We are hoping that is Perjeta.  She is scheduled to receive trastuzumab alone on 07/03/2018.  She tells me the rash takes about a week to develop.  She is going to call me on 07/06/2018 to tell me how her skin is doing.  If she does develop a rash we will treat heavily with antihistamines and steroids.  We can consider also adding a week's worth of doxycycline.  If she does not develop a rash then we will simply omit Pertuzumab from her remaining treatments, and her fifth cycle is scheduled for July 15, 2018  She has a good understanding of this plan.  She knows to call for any problems that may develop before the next visit.  Magrinat, Virgie Dad, MD  06/30/18 8:42 AM Medical Oncology and Hematology [facility] [address] [place], [place] [address] Tel. [phone]    Fax. [phone]  Alice Rieger, am acting as scribe for Chauncey Cruel MD.  I, Lurline Del MD, have reviewed the above documentation for accuracy and completeness, and I agree with the above.

## 2018-06-30 ENCOUNTER — Inpatient Hospital Stay (HOSPITAL_BASED_OUTPATIENT_CLINIC_OR_DEPARTMENT_OTHER): Payer: BC Managed Care – PPO | Admitting: Oncology

## 2018-06-30 ENCOUNTER — Ambulatory Visit: Payer: BC Managed Care – PPO | Admitting: Oncology

## 2018-06-30 ENCOUNTER — Inpatient Hospital Stay: Payer: BC Managed Care – PPO | Attending: Oncology

## 2018-06-30 ENCOUNTER — Other Ambulatory Visit: Payer: BC Managed Care – PPO

## 2018-06-30 ENCOUNTER — Inpatient Hospital Stay: Payer: BC Managed Care – PPO

## 2018-06-30 ENCOUNTER — Telehealth: Payer: Self-pay | Admitting: Oncology

## 2018-06-30 VITALS — BP 122/68 | HR 79 | Temp 98.5°F | Resp 18 | Ht 61.0 in | Wt 124.9 lb

## 2018-06-30 DIAGNOSIS — Z171 Estrogen receptor negative status [ER-]: Secondary | ICD-10-CM | POA: Diagnosis not present

## 2018-06-30 DIAGNOSIS — Z9221 Personal history of antineoplastic chemotherapy: Secondary | ICD-10-CM

## 2018-06-30 DIAGNOSIS — Z5112 Encounter for antineoplastic immunotherapy: Secondary | ICD-10-CM | POA: Insufficient documentation

## 2018-06-30 DIAGNOSIS — Z5189 Encounter for other specified aftercare: Secondary | ICD-10-CM | POA: Diagnosis not present

## 2018-06-30 DIAGNOSIS — C50512 Malignant neoplasm of lower-outer quadrant of left female breast: Secondary | ICD-10-CM

## 2018-06-30 DIAGNOSIS — Z9012 Acquired absence of left breast and nipple: Secondary | ICD-10-CM

## 2018-06-30 LAB — CBC WITH DIFFERENTIAL/PLATELET
Basophils Absolute: 0 10*3/uL (ref 0.0–0.1)
Basophils Relative: 1 %
Eosinophils Absolute: 0.1 10*3/uL (ref 0.0–0.5)
Eosinophils Relative: 2 %
HEMATOCRIT: 34.2 % — AB (ref 34.8–46.6)
HEMOGLOBIN: 11.5 g/dL — AB (ref 11.6–15.9)
LYMPHS PCT: 37 %
Lymphs Abs: 1.3 10*3/uL (ref 0.9–3.3)
MCH: 30.8 pg (ref 25.1–34.0)
MCHC: 33.5 g/dL (ref 31.5–36.0)
MCV: 92 fL (ref 79.5–101.0)
MONO ABS: 0.2 10*3/uL (ref 0.1–0.9)
Monocytes Relative: 7 %
NEUTROS ABS: 1.9 10*3/uL (ref 1.5–6.5)
Neutrophils Relative %: 53 %
Platelets: 115 10*3/uL — ABNORMAL LOW (ref 145–400)
RBC: 3.72 MIL/uL (ref 3.70–5.45)
RDW: 14.3 % (ref 11.2–14.5)
WBC: 3.6 10*3/uL — AB (ref 3.9–10.3)

## 2018-06-30 LAB — COMPREHENSIVE METABOLIC PANEL
ALBUMIN: 3.9 g/dL (ref 3.5–5.0)
ALK PHOS: 113 U/L (ref 38–126)
ALT: 27 U/L (ref 0–44)
ANION GAP: 5 (ref 5–15)
AST: 18 U/L (ref 15–41)
BILIRUBIN TOTAL: 0.3 mg/dL (ref 0.3–1.2)
BUN: 10 mg/dL (ref 6–20)
CO2: 32 mmol/L (ref 22–32)
Calcium: 9.9 mg/dL (ref 8.9–10.3)
Chloride: 102 mmol/L (ref 98–111)
Creatinine, Ser: 0.71 mg/dL (ref 0.44–1.00)
GLUCOSE: 84 mg/dL (ref 70–99)
POTASSIUM: 4 mmol/L (ref 3.5–5.1)
Sodium: 139 mmol/L (ref 135–145)
TOTAL PROTEIN: 6.4 g/dL — AB (ref 6.5–8.1)

## 2018-06-30 MED ORDER — ACETAMINOPHEN 325 MG PO TABS
650.0000 mg | ORAL_TABLET | Freq: Once | ORAL | Status: AC
  Administered 2018-06-30: 650 mg via ORAL

## 2018-06-30 MED ORDER — DIPHENHYDRAMINE HCL 25 MG PO CAPS
ORAL_CAPSULE | ORAL | Status: AC
  Filled 2018-06-30: qty 2

## 2018-06-30 MED ORDER — HEPARIN SOD (PORK) LOCK FLUSH 100 UNIT/ML IV SOLN
500.0000 [IU] | Freq: Once | INTRAVENOUS | Status: AC | PRN
  Administered 2018-06-30: 500 [IU]
  Filled 2018-06-30: qty 5

## 2018-06-30 MED ORDER — ACETAMINOPHEN 325 MG PO TABS
ORAL_TABLET | ORAL | Status: AC
  Filled 2018-06-30: qty 2

## 2018-06-30 MED ORDER — SODIUM CHLORIDE 0.9% FLUSH
10.0000 mL | INTRAVENOUS | Status: DC | PRN
  Administered 2018-06-30: 10 mL
  Filled 2018-06-30: qty 10

## 2018-06-30 MED ORDER — SODIUM CHLORIDE 0.9 % IV SOLN
Freq: Once | INTRAVENOUS | Status: AC
  Administered 2018-06-30: 10:00:00 via INTRAVENOUS

## 2018-06-30 MED ORDER — TRASTUZUMAB CHEMO 150 MG IV SOLR
4.0000 mg/kg | Freq: Once | INTRAVENOUS | Status: AC
  Administered 2018-06-30: 231 mg via INTRAVENOUS
  Filled 2018-06-30: qty 11

## 2018-06-30 MED ORDER — DIPHENHYDRAMINE HCL 25 MG PO CAPS
50.0000 mg | ORAL_CAPSULE | Freq: Once | ORAL | Status: AC
  Administered 2018-06-30: 50 mg via ORAL

## 2018-06-30 NOTE — Patient Instructions (Signed)
[facility] Discharge Instructions for Patients Receiving Chemotherapy  Today you received the following chemotherapy agents Herceptin  To help prevent nausea and vomiting after your treatment, we encourage you to take your nausea medication as directed   If you develop nausea and vomiting that is not controlled by your nausea medication, call the clinic.   BELOW ARE SYMPTOMS THAT SHOULD BE REPORTED IMMEDIATELY:  *FEVER GREATER THAN 100.5 F  *CHILLS WITH OR WITHOUT FEVER  NAUSEA AND VOMITING THAT IS NOT CONTROLLED WITH YOUR NAUSEA MEDICATION  *UNUSUAL SHORTNESS OF BREATH  *UNUSUAL BRUISING OR BLEEDING  TENDERNESS IN MOUTH AND THROAT WITH OR WITHOUT PRESENCE OF ULCERS  *URINARY PROBLEMS  *BOWEL PROBLEMS  UNUSUAL RASH Items with * indicate a potential emergency and should be followed up as soon as possible.  Feel free to call the clinic should you have any questions or concerns. The clinic phone number is [phone].  Please show the CHEMO ALERT CARD at check-in to the Emergency Department and triage nurse.   

## 2018-06-30 NOTE — Telephone Encounter (Signed)
Per 7/3 no los

## 2018-07-03 ENCOUNTER — Inpatient Hospital Stay: Payer: BC Managed Care – PPO

## 2018-07-15 ENCOUNTER — Other Ambulatory Visit: Payer: Self-pay | Admitting: Oncology

## 2018-07-15 ENCOUNTER — Inpatient Hospital Stay: Payer: BC Managed Care – PPO

## 2018-07-15 ENCOUNTER — Telehealth: Payer: Self-pay | Admitting: Genetics

## 2018-07-15 ENCOUNTER — Other Ambulatory Visit: Payer: BC Managed Care – PPO

## 2018-07-15 ENCOUNTER — Encounter: Payer: Self-pay | Admitting: Genetics

## 2018-07-15 DIAGNOSIS — C50512 Malignant neoplasm of lower-outer quadrant of left female breast: Secondary | ICD-10-CM

## 2018-07-15 DIAGNOSIS — Z95828 Presence of other vascular implants and grafts: Secondary | ICD-10-CM

## 2018-07-15 DIAGNOSIS — Z171 Estrogen receptor negative status [ER-]: Principal | ICD-10-CM

## 2018-07-15 LAB — COMPREHENSIVE METABOLIC PANEL
ALT: 60 U/L — ABNORMAL HIGH (ref 0–44)
ANION GAP: 7 (ref 5–15)
AST: 32 U/L (ref 15–41)
Albumin: 3.8 g/dL (ref 3.5–5.0)
Alkaline Phosphatase: 117 U/L (ref 38–126)
BILIRUBIN TOTAL: 0.2 mg/dL — AB (ref 0.3–1.2)
BUN: 11 mg/dL (ref 6–20)
CHLORIDE: 108 mmol/L (ref 98–111)
CO2: 25 mmol/L (ref 22–32)
Calcium: 10 mg/dL (ref 8.9–10.3)
Creatinine, Ser: 0.65 mg/dL (ref 0.44–1.00)
GFR calc Af Amer: >60 mL/min (ref >60)
GFR calc non Af Amer: >60 mL/min (ref >60)
GLUCOSE: 114 mg/dL — AB (ref 70–99)
POTASSIUM: 4.1 mmol/L (ref 3.5–5.1)
Sodium: 140 mmol/L (ref 135–145)
TOTAL PROTEIN: 6.5 g/dL (ref 6.5–8.1)

## 2018-07-15 LAB — CBC WITH DIFFERENTIAL/PLATELET
Basophils Absolute: 0 10*3/uL (ref 0.0–0.1)
Basophils Relative: 0 %
Eosinophils Absolute: 0 10*3/uL (ref 0.0–0.5)
Eosinophils Relative: 0 %
HEMATOCRIT: 32.2 % — AB (ref 34.8–46.6)
Hemoglobin: 10.7 g/dL — ABNORMAL LOW (ref 11.6–15.9)
LYMPHS ABS: 0.9 10*3/uL (ref 0.9–3.3)
LYMPHS PCT: 9 %
MCH: 31 pg (ref 25.1–34.0)
MCHC: 33.2 g/dL (ref 31.5–36.0)
MCV: 93.3 fL (ref 79.5–101.0)
MONO ABS: 0.4 10*3/uL (ref 0.1–0.9)
Monocytes Relative: 5 %
NEUTROS ABS: 8.2 10*3/uL — AB (ref 1.5–6.5)
Neutrophils Relative %: 86 %
Platelets: 238 10*3/uL (ref 145–400)
RBC: 3.45 MIL/uL — AB (ref 3.70–5.45)
RDW: 15.5 % — ABNORMAL HIGH (ref 11.2–14.5)
WBC: 9.5 10*3/uL (ref 3.9–10.3)

## 2018-07-15 MED ORDER — SODIUM CHLORIDE 0.9% FLUSH
10.0000 mL | INTRAVENOUS | Status: DC | PRN
  Administered 2018-07-15: 10 mL
  Filled 2018-07-15: qty 10

## 2018-07-15 MED ORDER — TRASTUZUMAB CHEMO 150 MG IV SOLR
6.0000 mg/kg | Freq: Once | INTRAVENOUS | Status: AC
  Administered 2018-07-15: 336 mg via INTRAVENOUS
  Filled 2018-07-15: qty 16

## 2018-07-15 MED ORDER — PALONOSETRON HCL INJECTION 0.25 MG/5ML
INTRAVENOUS | Status: AC
  Filled 2018-07-15: qty 5

## 2018-07-15 MED ORDER — DIPHENHYDRAMINE HCL 25 MG PO CAPS
50.0000 mg | ORAL_CAPSULE | Freq: Once | ORAL | Status: AC
  Administered 2018-07-15: 50 mg via ORAL

## 2018-07-15 MED ORDER — ACETAMINOPHEN 325 MG PO TABS
650.0000 mg | ORAL_TABLET | Freq: Once | ORAL | Status: AC
  Administered 2018-07-15: 650 mg via ORAL

## 2018-07-15 MED ORDER — ACETAMINOPHEN 325 MG PO TABS
ORAL_TABLET | ORAL | Status: AC
  Filled 2018-07-15: qty 2

## 2018-07-15 MED ORDER — HEPARIN SOD (PORK) LOCK FLUSH 100 UNIT/ML IV SOLN
500.0000 [IU] | Freq: Once | INTRAVENOUS | Status: AC | PRN
  Administered 2018-07-15: 500 [IU]
  Filled 2018-07-15: qty 5

## 2018-07-15 MED ORDER — SODIUM CHLORIDE 0.9 % IV SOLN
Freq: Once | INTRAVENOUS | Status: AC
  Administered 2018-07-15: 12:00:00 via INTRAVENOUS

## 2018-07-15 MED ORDER — FOSAPREPITANT DIMEGLUMINE INJECTION 150 MG
Freq: Once | INTRAVENOUS | Status: AC
  Administered 2018-07-15: 12:00:00 via INTRAVENOUS
  Filled 2018-07-15: qty 5

## 2018-07-15 MED ORDER — SODIUM CHLORIDE 0.9 % IV SOLN
498.0000 mg | Freq: Once | INTRAVENOUS | Status: AC
  Administered 2018-07-15: 500 mg via INTRAVENOUS
  Filled 2018-07-15: qty 50

## 2018-07-15 MED ORDER — DOCETAXEL CHEMO INJECTION 160 MG/16ML
75.0000 mg/m2 | Freq: Once | INTRAVENOUS | Status: AC
  Administered 2018-07-15: 120 mg via INTRAVENOUS
  Filled 2018-07-15: qty 12

## 2018-07-15 MED ORDER — PALONOSETRON HCL INJECTION 0.25 MG/5ML
0.2500 mg | Freq: Once | INTRAVENOUS | Status: AC
  Administered 2018-07-15: 0.25 mg via INTRAVENOUS

## 2018-07-15 MED ORDER — DIPHENHYDRAMINE HCL 25 MG PO CAPS
ORAL_CAPSULE | ORAL | Status: AC
  Filled 2018-07-15: qty 2

## 2018-07-15 NOTE — Patient Instructions (Signed)
[facility] Discharge Instructions for Patients Receiving Chemotherapy  Today you received the following chemotherapy agents: Herceptin, Taxotere, Carboplatin  To help prevent nausea and vomiting after your treatment, we encourage you to take your nausea medication as directed.   If you develop nausea and vomiting that is not controlled by your nausea medication, call the clinic.   BELOW ARE SYMPTOMS THAT SHOULD BE REPORTED IMMEDIATELY:  *FEVER GREATER THAN 100.5 F  *CHILLS WITH OR WITHOUT FEVER  NAUSEA AND VOMITING THAT IS NOT CONTROLLED WITH YOUR NAUSEA MEDICATION  *UNUSUAL SHORTNESS OF BREATH  *UNUSUAL BRUISING OR BLEEDING  TENDERNESS IN MOUTH AND THROAT WITH OR WITHOUT PRESENCE OF ULCERS  *URINARY PROBLEMS  *BOWEL PROBLEMS  UNUSUAL RASH Items with * indicate a potential emergency and should be followed up as soon as possible.  Feel free to call the clinic should you have any questions or concerns. The clinic phone number is [phone].  Please show the [place] at check-in to the Emergency Department and triage nurse.

## 2018-07-17 ENCOUNTER — Inpatient Hospital Stay: Payer: BC Managed Care – PPO

## 2018-07-17 VITALS — BP 142/85 | HR 56 | Temp 97.7°F | Resp 16

## 2018-07-17 DIAGNOSIS — C50512 Malignant neoplasm of lower-outer quadrant of left female breast: Secondary | ICD-10-CM | POA: Diagnosis not present

## 2018-07-17 DIAGNOSIS — Z171 Estrogen receptor negative status [ER-]: Principal | ICD-10-CM

## 2018-07-17 MED ORDER — PEGFILGRASTIM-CBQV 6 MG/0.6ML ~~LOC~~ SOSY
6.0000 mg | PREFILLED_SYRINGE | Freq: Once | SUBCUTANEOUS | Status: AC
  Administered 2018-07-17: 6 mg via SUBCUTANEOUS

## 2018-07-17 MED ORDER — PEGFILGRASTIM-CBQV 6 MG/0.6ML ~~LOC~~ SOSY
PREFILLED_SYRINGE | SUBCUTANEOUS | Status: AC
  Filled 2018-07-17: qty 0.6

## 2018-07-17 NOTE — Patient Instructions (Signed)
Pegfilgrastim injection What is this medicine? PEGFILGRASTIM (PEG fil gra stim) is a long-acting granulocyte colony-stimulating factor that stimulates the growth of neutrophils, a type of white blood cell important in the body's fight against infection. It is used to reduce the incidence of fever and infection in patients with certain types of cancer who are receiving chemotherapy that affects the bone marrow, and to increase survival after being exposed to high doses of radiation. This medicine may be used for other purposes; ask your health care provider or pharmacist if you have questions. COMMON BRAND NAME(S): Neulasta What should I tell my health care provider before I take this medicine? They need to know if you have any of these conditions: -kidney disease -latex allergy -ongoing radiation therapy -sickle cell disease -skin reactions to acrylic adhesives (On-Body Injector only) -an unusual or allergic reaction to pegfilgrastim, filgrastim, other medicines, foods, dyes, or preservatives -pregnant or trying to get pregnant -breast-feeding How should I use this medicine? This medicine is for injection under the skin. If you get this medicine at home, you will be taught how to prepare and give the pre-filled syringe or how to use the On-body Injector. Refer to the patient Instructions for Use for detailed instructions. Use exactly as directed. Tell your healthcare provider immediately if you suspect that the On-body Injector may not have performed as intended or if you suspect the use of the On-body Injector resulted in a missed or partial dose. It is important that you put your used needles and syringes in a special sharps container. Do not put them in a trash can. If you do not have a sharps container, call your pharmacist or healthcare provider to get one. Talk to your pediatrician regarding the use of this medicine in children. While this drug may be prescribed for selected conditions,  precautions do apply. Overdosage: If you think you have taken too much of this medicine contact a poison control center or emergency room at once. NOTE: This medicine is only for you. Do not share this medicine with others. What if I miss a dose? It is important not to miss your dose. Call your doctor or health care professional if you miss your dose. If you miss a dose due to an On-body Injector failure or leakage, a new dose should be administered as soon as possible using a single prefilled syringe for manual use. What may interact with this medicine? Interactions have not been studied. Give your health care provider a list of all the medicines, herbs, non-prescription drugs, or dietary supplements you use. Also tell them if you smoke, drink alcohol, or use illegal drugs. Some items may interact with your medicine. This list may not describe all possible interactions. Give your health care provider a list of all the medicines, herbs, non-prescription drugs, or dietary supplements you use. Also tell them if you smoke, drink alcohol, or use illegal drugs. Some items may interact with your medicine. What should I watch for while using this medicine? You may need blood work done while you are taking this medicine. If you are going to need a MRI, CT scan, or other procedure, tell your doctor that you are using this medicine (On-Body Injector only). What side effects may I notice from receiving this medicine? Side effects that you should report to your doctor or health care professional as soon as possible: -allergic reactions like skin rash, itching or hives, swelling of the face, lips, or tongue -dizziness -fever -pain, redness, or irritation at site   where injected -pinpoint red spots on the skin -red or dark-brown urine -shortness of breath or breathing problems -stomach or side pain, or pain at the shoulder -swelling -tiredness -trouble passing urine or change in the amount of urine Side  effects that usually do not require medical attention (report to your doctor or health care professional if they continue or are bothersome): -bone pain -muscle pain This list may not describe all possible side effects. Call your doctor for medical advice about side effects. You may report side effects to FDA at 1-800-FDA-1088. Where should I keep my medicine? Keep out of the reach of children. Store pre-filled syringes in a refrigerator between 2 and 8 degrees C (36 and 46 degrees F). Do not freeze. Keep in carton to protect from light. Throw away this medicine if it is left out of the refrigerator for more than 48 hours. Throw away any unused medicine after the expiration date. NOTE: This sheet is a summary. It may not cover all possible information. If you have questions about this medicine, talk to your doctor, pharmacist, or health care provider.  2018 Elsevier/Gold Standard (2016-12-11 12:58:03)  

## 2018-07-20 ENCOUNTER — Telehealth: Payer: Self-pay | Admitting: Oncology

## 2018-07-20 NOTE — Telephone Encounter (Signed)
Called patient regarding 7/25

## 2018-07-22 ENCOUNTER — Inpatient Hospital Stay: Payer: BC Managed Care – PPO

## 2018-07-22 DIAGNOSIS — C50512 Malignant neoplasm of lower-outer quadrant of left female breast: Secondary | ICD-10-CM

## 2018-07-22 DIAGNOSIS — Z171 Estrogen receptor negative status [ER-]: Principal | ICD-10-CM

## 2018-07-22 LAB — COMPREHENSIVE METABOLIC PANEL
ALBUMIN: 3.8 g/dL (ref 3.5–5.0)
ALT: 44 U/L (ref 0–44)
AST: 26 U/L (ref 15–41)
Alkaline Phosphatase: 129 U/L — ABNORMAL HIGH (ref 38–126)
Anion gap: 8 (ref 5–15)
BILIRUBIN TOTAL: 0.2 mg/dL — AB (ref 0.3–1.2)
BUN: 12 mg/dL (ref 6–20)
CO2: 32 mmol/L (ref 22–32)
CREATININE: 0.75 mg/dL (ref 0.44–1.00)
Calcium: 9.7 mg/dL (ref 8.9–10.3)
Chloride: 101 mmol/L (ref 98–111)
GFR calc Af Amer: >60 mL/min (ref >60)
Glucose, Bld: 98 mg/dL (ref 70–99)
POTASSIUM: 4 mmol/L (ref 3.5–5.1)
Sodium: 141 mmol/L (ref 135–145)
TOTAL PROTEIN: 6.5 g/dL (ref 6.5–8.1)

## 2018-07-22 LAB — CBC WITH DIFFERENTIAL/PLATELET
BASOS ABS: 0 10*3/uL (ref 0.0–0.1)
BASOS PCT: 0 %
EOS ABS: 0 10*3/uL (ref 0.0–0.5)
Eosinophils Relative: 0 %
HCT: 32.1 % — ABNORMAL LOW (ref 34.8–46.6)
Hemoglobin: 10.9 g/dL — ABNORMAL LOW (ref 11.6–15.9)
Lymphocytes Relative: 28 %
Lymphs Abs: 2.9 10*3/uL (ref 0.9–3.3)
MCH: 31.3 pg (ref 25.1–34.0)
MCHC: 33.8 g/dL (ref 31.5–36.0)
MCV: 92.7 fL (ref 79.5–101.0)
Monocytes Absolute: 1.4 10*3/uL — ABNORMAL HIGH (ref 0.1–0.9)
Monocytes Relative: 14 %
Neutro Abs: 5.9 10*3/uL (ref 1.5–6.5)
Neutrophils Relative %: 58 %
PLATELETS: 167 10*3/uL (ref 145–400)
RBC: 3.47 MIL/uL — AB (ref 3.70–5.45)
RDW: 15.6 % — ABNORMAL HIGH (ref 11.2–14.5)
WBC: 10.2 10*3/uL (ref 3.9–10.3)

## 2018-07-26 ENCOUNTER — Ambulatory Visit: Payer: Self-pay | Admitting: Genetics

## 2018-07-26 DIAGNOSIS — Z800 Family history of malignant neoplasm of digestive organs: Secondary | ICD-10-CM

## 2018-07-26 DIAGNOSIS — Z1379 Encounter for other screening for genetic and chromosomal anomalies: Secondary | ICD-10-CM

## 2018-07-26 DIAGNOSIS — C50512 Malignant neoplasm of lower-outer quadrant of left female breast: Secondary | ICD-10-CM

## 2018-07-26 DIAGNOSIS — Z171 Estrogen receptor negative status [ER-]: Principal | ICD-10-CM

## 2018-07-26 DIAGNOSIS — Z803 Family history of malignant neoplasm of breast: Secondary | ICD-10-CM

## 2018-07-26 DIAGNOSIS — Z801 Family history of malignant neoplasm of trachea, bronchus and lung: Secondary | ICD-10-CM

## 2018-07-26 NOTE — Progress Notes (Signed)
HPI:  Ms. Larsen was previously seen in the [facility] on 06/15/2018 due to a personal and family history of cancer and concerns regarding a hereditary predisposition to cancer. Please refer to our prior cancer genetics clinic note for more information regarding Ms. Daluz's medical, social and family histories, and our assessment and recommendations, at the time. Ms. Coventry recent genetic test results were disclosed to her, as well as recommendations warranted by these results. These results and recommendations are discussed in more detail below.  CANCER HISTORY:    Malignant neoplasm of lower-outer quadrant of left breast of female, estrogen receptor negative ([place])   05/03/2018 Initial Diagnosis    Malignant neoplasm of lower-outer quadrant of left breast of female, estrogen receptor negative ([place])      05/12/2018 -  Chemotherapy    The patient had palonosetron (ALOXI) injection 0.25 mg, 0.25 mg, Intravenous,  Once, 4 of 6 cycles Administration: 0.25 mg (05/13/2018), 0.25 mg (06/03/2018), 0.25 mg (06/24/2018), 0.25 mg (07/15/2018) pegfilgrastim-cbqv (UDENYCA) injection 6 mg, 6 mg, Subcutaneous, Once, 4 of 7 cycles Administration: 6 mg (05/15/2018), 6 mg (06/05/2018), 6 mg (06/26/2018), 6 mg (07/17/2018) trastuzumab (HERCEPTIN) 450 mg in sodium chloride 0.9 % 250 mL chemo infusion, 462 mg, Intravenous,  Once, 4 of 17 cycles Administration: 450 mg (05/13/2018), 336 mg (06/03/2018), 336 mg (07/15/2018) CARBOplatin (PARAPLATIN) 500 mg in sodium chloride 0.9 % 250 mL chemo infusion, 500 mg (100 % of original dose 498 mg), Intravenous,  Once, 4 of 6 cycles Dose modification:   (original dose 498 mg, Cycle 1),   (original dose 498 mg, Cycle 2) Administration: 500 mg (05/13/2018), 500 mg (06/03/2018), 500 mg (06/24/2018), 500 mg (07/15/2018) DOCEtaxel (TAXOTERE) 120 mg in sodium chloride 0.9 % 250 mL chemo infusion, 75 mg/m2 = 120 mg, Intravenous,  Once, 4 of 6 cycles Administration: 120 mg  (05/13/2018), 120 mg (06/03/2018), 120 mg (06/24/2018), 120 mg (07/15/2018) pertuzumab (PERJETA) 420 mg in sodium chloride 0.9 % 250 mL chemo infusion, 420 mg (100 % of original dose 420 mg), Intravenous, Once, 3 of 3 cycles Dose modification: 420 mg (original dose 420 mg, Cycle 1, Reason: Provider Judgment) Administration: 420 mg (05/13/2018), 420 mg (06/03/2018) fosaprepitant (EMEND) 150 mg, dexamethasone (DECADRON) 12 mg in sodium chloride 0.9 % 145 mL IVPB, , Intravenous,  Once, 4 of 6 cycles Administration:  (05/13/2018),  (06/03/2018),  (06/24/2018),  (07/15/2018)  for chemotherapy treatment.       06/29/2018 -  Chemotherapy    The patient had trastuzumab (HERCEPTIN) 231 mg in sodium chloride 0.9 % 250 mL chemo infusion, 4 mg/kg = 231 mg (100 % of original dose 4 mg/kg), Intravenous,  Once, 1 of 5 cycles Dose modification: 4 mg/kg (original dose 4 mg/kg, Cycle 1, Reason: Provider Judgment) Administration: 231 mg (06/30/2018)  for chemotherapy treatment.       07/14/2018 Genetic Testing    A 65 gene panel was ordered from the laboratory [facility]. The following genes were evaluated for sequence changes and exonic deletions/duplications: APC, ATM, AXIN2, BAP1, BARD1, BLM, BMPR1A, BRCA1, BRCA2, BRIP1, BUB1B, CDC73, CDH1, CDK4, CDKN1C, CDKN2A (p14ARF), CDKN2A (p16INK4a), CEP57, CHEK2, CTNNA1, DICER1, DIS3L2, ENG, EPCAM*, FH, FLCN, GALNT12, GPC3, GREM1*, KIT, MEN1, MET, MLH1, MLH3, MSH2, MSH3, MSH6, MUTYH, NBN, NF1, PALB2, PDGFRA, PMS2, POLD1, POLE, PTEN, RAD50, RAD51C, RAD51D, RPS20, SDHB, SDHC, SDHD, SMAD4, SMARCA4, SMARCB1, STK11, TP53, TSC1, TSC2, VHL, WT1. The following genes were evaluated for sequence changes only: EGFR*, HOXB13*, NTHL1*, SDHA  Results: No pathogenic variants identified.  A variant of uncertain significance in the gene SMARCA4 was identified c.4913A>G (p.Gln1638Arg).  The date of this teste report is 07/14/2018.         FAMILY HISTORY:  We obtained a detailed, 4-generation family  history.  Significant diagnoses are listed below: Family History  Problem Relation Age of Onset  . Colon cancer Father 1  . Basal cell carcinoma Sister        2-3  . Lung cancer Maternal Aunt        hx smoking  . Lung cancer Paternal Uncle        hx smoking  . Heart disease Maternal Grandmother   . Heart disease Maternal Grandfather   . Cancer Paternal Grandfather        unk if colon or stomach? died in early 107's.   . Esophageal cancer Neg Hx   . Stomach cancer Neg Hx   . Rectal cancer Neg Hx     Ms. Rossi's father: died at 102 due to colon cancer dx in his 49's.  Paternal aunts/Uncles: 3 paternal uncles, 1 had lung cancer (hx smoking), 3 paternal aunts with no history of cancer.   Paternal cousins: 1 paternal cousin died of breast cancer in her early 30's, she was first diagnosed in her early 43's.  Unk if she had genetic testing or not.  Paternal grandfather: died in his early 21's due to colon or stomach cancer.  Paternal grandmother:died in her 57's with no history of cancer.   Ms. Rocco mother: 36, no history of cancer, uterus and ovaries intact.  Maternal Aunts/Uncles: 7 maternal aunts, 1 had lung cancer (hx smoking), 1 maternal uncle with no history of cancer.  Maternal cousins: 1 maternal cousin had kidney/bladder cancer in her early 10's.   Maternal grandfather: died in his 62 due to heart disease.  Maternal grandmother:died in her 26's due to heart disease.   Ms. Essman is unaware of previous family history of genetic testing for hereditary cancer risks. Patient's maternal ancestors are of Northern European descent, and paternal ancestors are of Northern European/Native American descent. There is no reported Ashkenazi Jewish ancestry. There is no known consanguinity.  GENETIC TEST RESULTS: Genetic testing performed through [facility]'s Common Hereditary Cancers Panel + Renal/Urinary Tract cancer panel reported out on 07/14/2018 showed no pathogenic mutations. The following  genes were evaluated for sequence changes and exonic deletions/duplications: APC, ATM, AXIN2, BAP1, BARD1, BLM, BMPR1A, BRCA1, BRCA2, BRIP1, BUB1B, CDC73, CDH1, CDK4, CDKN1C, CDKN2A (p14ARF), CDKN2A (p16INK4a), CEP57, CHEK2, CTNNA1, DICER1, DIS3L2, ENG, EPCAM*, FH, FLCN, GALNT12, GPC3, GREM1*, KIT, MEN1, MET, MLH1, MLH3, MSH2, MSH3, MSH6, MUTYH, NBN, NF1, PALB2, PDGFRA, PMS2, POLD1, POLE, PTEN, RAD50, RAD51C, RAD51D, RPS20, SDHB, SDHC, SDHD, SMAD4, SMARCA4, SMARCB1, STK11, TP53, TSC1, TSC2, VHL, WT1. The following genes were evaluated for sequence changes only: EGFR*, HOXB13*, NTHL1*, SDHA.  A variant of uncertain significance (VUS) in a gene called SMARCA4 was also noted. c.4913A>G (p.Gln1638Arg).   The test report will be scanned into EPIC and will be located under the Molecular Pathology section of the Results Review tab. A portion of the result report is included below for reference.     We discussed with Ms. Icenhour that because current genetic testing is not perfect, it is possible there may be a gene mutation in one of these genes that current testing cannot detect, but that chance is small.  We also discussed, that there could be another gene that has not yet been discovered, or that we have not  yet tested, that is responsible for the cancer diagnoses in the family. It is also possible there is a hereditary cause for the cancer in the family that Ms. Hipps did not inherit and therefore was not identified in her testing.  Therefore, it is important to remain in touch with cancer genetics in the future so that we can continue to offer Ms. Grosso the most up to date genetic testing.   Regarding the VUS in SMARCA4: At this time, it is unknown if this variant is associated with increased cancer risk or if this is a normal finding, but most variants such as this get reclassified to being inconsequential. It should not be used to make medical management decisions. With time, we suspect the lab will determine  the significance of this variant, if any. If we do learn more about it, we will try to contact Ms. Hornik to discuss it further. However, it is important to stay in touch with [place] periodically and keep the address and phone number up to date.  ADDITIONAL GENETIC TESTING: We discussed with Ms. Mcquitty that her genetic testing was fairly extensive.  If there are are genes identified to increase cancer risk that can be analyzed in the future, we would be happy to discuss and coordinate this testing at that time.    CANCER SCREENING RECOMMENDATIONS: Ms. Vizcarrondo test result is considered negative (normal).  This means that we have not identified a hereditary cause for her personal and family history of cancer at this time.   While reassuring, this does not definitively rule out a hereditary predisposition to cancer. It is still possible that there could be genetic mutations that are undetectable by current technology, or genetic mutations in genes that have not been tested or identified to increase cancer risk.  Therefore, it is recommended she continue to follow the cancer management and screening guidelines provided by her oncology and primary healthcare provider. An individual's cancer risk is not determined by genetic test results alone.  Overall cancer risk assessment includes additional factors such as personal medical history, family history, etc.  These should be used to make a personalized plan for cancer prevention and surveillance.    RECOMMENDATIONS FOR FAMILY MEMBERS:  Relatives in this family might be at some increased risk of developing cancer, over the general population risk, simply due to the family history of cancer.  We recommended women in this family have a yearly mammogram beginning at age 50, or 38 years younger than the earliest onset of cancer, an annual clinical breast exam, and perform monthly breast self-exams. Women in this family should also have a gynecological exam as recommended by  their primary provider. All family members should have a colonoscopy by age 72 (or as directed by their doctors).  All family members should inform their physicians about the family history of cancer so their doctors can make the most appropriate screening recommendations for them.   It is also possible there is a hereditary cause for the cancer in Ms. Beaver's family that she did not inherit and therefore was not identified in her.  Therefore, we recommended her siblings/ paternal relatives also, have genetic counseling and testing. Ms. Thede will let us know if we can be of any assistance in coordinating genetic counseling and/or testing for these family members.   FOLLOW-UP: Lastly, we discussed with Ms. Bonnell that cancer genetics is a rapidly advancing field and it is possible that new genetic tests will be appropriate for her and/or her  family members in the future. We encouraged her to remain in contact with cancer genetics on an annual basis so we can update her personal and family histories and let her know of advances in cancer genetics that may benefit this family.   Our contact number was provided. Ms. Pimenta questions were answered to her satisfaction, and she knows she is welcome to call us at anytime with additional questions or concerns.   Ferol Luz, MS, [facility] Certified Genetic Counselor [email or web address]

## 2018-07-28 ENCOUNTER — Encounter: Payer: Self-pay | Admitting: Adult Health

## 2018-07-28 NOTE — Telephone Encounter (Signed)
Revealed negative genetic testing.  Revealed that a VUS in [facility] was identified.   This normal result is reassuring and indicates that it is unlikely Monica Padilla's cancer is due to a hereditary cause.  It is unlikely that there is an increased risk of another cancer due to a mutation in one of these genes.  However, genetic testing is not perfect, and cannot definitively rule out a hereditary cause.  It will be important for her to keep in contact with genetics to learn if any additional testing may be needed in the future.     Recommended siglings/paternal relatives also have testing. Marland Kitchen

## 2018-07-30 ENCOUNTER — Other Ambulatory Visit: Payer: Self-pay | Admitting: Oncology

## 2018-07-30 DIAGNOSIS — C50512 Malignant neoplasm of lower-outer quadrant of left female breast: Secondary | ICD-10-CM

## 2018-07-30 DIAGNOSIS — Z171 Estrogen receptor negative status [ER-]: Principal | ICD-10-CM

## 2018-08-03 ENCOUNTER — Other Ambulatory Visit: Payer: Self-pay | Admitting: Oncology

## 2018-08-05 ENCOUNTER — Inpatient Hospital Stay: Payer: BC Managed Care – PPO

## 2018-08-05 ENCOUNTER — Inpatient Hospital Stay: Payer: BC Managed Care – PPO | Attending: Oncology

## 2018-08-05 ENCOUNTER — Other Ambulatory Visit: Payer: BC Managed Care – PPO

## 2018-08-05 ENCOUNTER — Other Ambulatory Visit: Payer: Self-pay

## 2018-08-05 VITALS — BP 132/80 | HR 75 | Temp 97.9°F | Resp 16

## 2018-08-05 DIAGNOSIS — C50512 Malignant neoplasm of lower-outer quadrant of left female breast: Secondary | ICD-10-CM | POA: Insufficient documentation

## 2018-08-05 DIAGNOSIS — Z79899 Other long term (current) drug therapy: Principal | ICD-10-CM

## 2018-08-05 DIAGNOSIS — Z5112 Encounter for antineoplastic immunotherapy: Secondary | ICD-10-CM | POA: Diagnosis present

## 2018-08-05 DIAGNOSIS — Z171 Estrogen receptor negative status [ER-]: Secondary | ICD-10-CM

## 2018-08-05 DIAGNOSIS — Z95828 Presence of other vascular implants and grafts: Secondary | ICD-10-CM

## 2018-08-05 DIAGNOSIS — Z5181 Encounter for therapeutic drug level monitoring: Secondary | ICD-10-CM

## 2018-08-05 LAB — CBC WITH DIFFERENTIAL/PLATELET
BASOS ABS: 0 10*3/uL (ref 0.0–0.1)
BASOS PCT: 0 %
Eosinophils Absolute: 0 10*3/uL (ref 0.0–0.5)
Eosinophils Relative: 0 %
HCT: 31.2 % — ABNORMAL LOW (ref 34.8–46.6)
Hemoglobin: 10.6 g/dL — ABNORMAL LOW (ref 11.6–15.9)
Lymphocytes Relative: 9 %
Lymphs Abs: 0.8 10*3/uL — ABNORMAL LOW (ref 0.9–3.3)
MCH: 31.8 pg (ref 25.1–34.0)
MCHC: 33.9 g/dL (ref 31.5–36.0)
MCV: 93.6 fL (ref 79.5–101.0)
Monocytes Absolute: 0.7 10*3/uL (ref 0.1–0.9)
Monocytes Relative: 8 %
Neutro Abs: 7.3 10*3/uL — ABNORMAL HIGH (ref 1.5–6.5)
Neutrophils Relative %: 83 %
Platelets: 219 10*3/uL (ref 145–400)
RBC: 3.33 MIL/uL — AB (ref 3.70–5.45)
RDW: 16.7 % — ABNORMAL HIGH (ref 11.2–14.5)
WBC: 8.7 10*3/uL (ref 3.9–10.3)

## 2018-08-05 LAB — COMPREHENSIVE METABOLIC PANEL
ALBUMIN: 3.8 g/dL (ref 3.5–5.0)
ALT: 48 U/L — AB (ref 0–44)
AST: 27 U/L (ref 15–41)
Alkaline Phosphatase: 120 U/L (ref 38–126)
Anion gap: 11 (ref 5–15)
BUN: 10 mg/dL (ref 6–20)
CO2: 23 mmol/L (ref 22–32)
CREATININE: 0.73 mg/dL (ref 0.44–1.00)
Calcium: 9.6 mg/dL (ref 8.9–10.3)
Chloride: 106 mmol/L (ref 98–111)
GFR calc Af Amer: >60 mL/min (ref >60)
GFR calc non Af Amer: >60 mL/min (ref >60)
GLUCOSE: 132 mg/dL — AB (ref 70–99)
POTASSIUM: 4 mmol/L (ref 3.5–5.1)
SODIUM: 140 mmol/L (ref 135–145)
Total Bilirubin: 0.4 mg/dL (ref 0.3–1.2)
Total Protein: 6.6 g/dL (ref 6.5–8.1)

## 2018-08-05 MED ORDER — DIPHENHYDRAMINE HCL 25 MG PO CAPS
50.0000 mg | ORAL_CAPSULE | Freq: Once | ORAL | Status: AC
  Administered 2018-08-05: 50 mg via ORAL

## 2018-08-05 MED ORDER — SODIUM CHLORIDE 0.9 % IV SOLN
75.0000 mg/m2 | Freq: Once | INTRAVENOUS | Status: AC
  Administered 2018-08-05: 120 mg via INTRAVENOUS
  Filled 2018-08-05: qty 12

## 2018-08-05 MED ORDER — CARBOPLATIN CHEMO INJECTION 600 MG/60ML
498.0000 mg | Freq: Once | INTRAVENOUS | Status: AC
  Administered 2018-08-05: 500 mg via INTRAVENOUS
  Filled 2018-08-05: qty 50

## 2018-08-05 MED ORDER — SODIUM CHLORIDE 0.9% FLUSH
10.0000 mL | INTRAVENOUS | Status: DC | PRN
  Administered 2018-08-05: 10 mL
  Filled 2018-08-05: qty 10

## 2018-08-05 MED ORDER — TRASTUZUMAB CHEMO 150 MG IV SOLR
6.0000 mg/kg | Freq: Once | INTRAVENOUS | Status: AC
  Administered 2018-08-05: 336 mg via INTRAVENOUS
  Filled 2018-08-05: qty 16

## 2018-08-05 MED ORDER — DIPHENHYDRAMINE HCL 25 MG PO CAPS
ORAL_CAPSULE | ORAL | Status: AC
  Filled 2018-08-05: qty 2

## 2018-08-05 MED ORDER — PALONOSETRON HCL INJECTION 0.25 MG/5ML
0.2500 mg | Freq: Once | INTRAVENOUS | Status: AC
  Administered 2018-08-05: 0.25 mg via INTRAVENOUS

## 2018-08-05 MED ORDER — SODIUM CHLORIDE 0.9 % IV SOLN
Freq: Once | INTRAVENOUS | Status: AC
  Administered 2018-08-05: 11:00:00 via INTRAVENOUS
  Filled 2018-08-05: qty 250

## 2018-08-05 MED ORDER — ACETAMINOPHEN 325 MG PO TABS
650.0000 mg | ORAL_TABLET | Freq: Once | ORAL | Status: AC
  Administered 2018-08-05: 650 mg via ORAL

## 2018-08-05 MED ORDER — HEPARIN SOD (PORK) LOCK FLUSH 100 UNIT/ML IV SOLN
500.0000 [IU] | Freq: Once | INTRAVENOUS | Status: AC | PRN
  Administered 2018-08-05: 500 [IU]
  Filled 2018-08-05: qty 5

## 2018-08-05 MED ORDER — FOSAPREPITANT DIMEGLUMINE INJECTION 150 MG
Freq: Once | INTRAVENOUS | Status: AC
  Administered 2018-08-05: 11:00:00 via INTRAVENOUS
  Filled 2018-08-05: qty 5

## 2018-08-05 MED ORDER — PALONOSETRON HCL INJECTION 0.25 MG/5ML
INTRAVENOUS | Status: AC
  Filled 2018-08-05: qty 5

## 2018-08-05 MED ORDER — ACETAMINOPHEN 325 MG PO TABS
ORAL_TABLET | ORAL | Status: AC
Start: 2018-08-05 — End: ?
  Filled 2018-08-05: qty 2

## 2018-08-05 NOTE — Patient Instructions (Signed)
[facility] Discharge Instructions for Patients Receiving Chemotherapy  Today you received the following chemotherapy agents: Herceptin, Taxotere, Carboplatin  To help prevent nausea and vomiting after your treatment, we encourage you to take your nausea medication as directed.   If you develop nausea and vomiting that is not controlled by your nausea medication, call the clinic.   BELOW ARE SYMPTOMS THAT SHOULD BE REPORTED IMMEDIATELY:  *FEVER GREATER THAN 100.5 F  *CHILLS WITH OR WITHOUT FEVER  NAUSEA AND VOMITING THAT IS NOT CONTROLLED WITH YOUR NAUSEA MEDICATION  *UNUSUAL SHORTNESS OF BREATH  *UNUSUAL BRUISING OR BLEEDING  TENDERNESS IN MOUTH AND THROAT WITH OR WITHOUT PRESENCE OF ULCERS  *URINARY PROBLEMS  *BOWEL PROBLEMS  UNUSUAL RASH Items with * indicate a potential emergency and should be followed up as soon as possible.  Feel free to call the clinic should you have any questions or concerns. The clinic phone number is [phone].  Please show the [place] at check-in to the Emergency Department and triage nurse.

## 2018-08-05 NOTE — Progress Notes (Signed)
echo

## 2018-08-06 ENCOUNTER — Encounter: Payer: Self-pay | Admitting: Adult Health

## 2018-08-06 ENCOUNTER — Other Ambulatory Visit: Payer: Self-pay | Admitting: Oncology

## 2018-08-07 ENCOUNTER — Inpatient Hospital Stay: Payer: BC Managed Care – PPO

## 2018-08-07 VITALS — BP 117/85 | HR 59 | Temp 98.4°F | Resp 16

## 2018-08-07 DIAGNOSIS — Z171 Estrogen receptor negative status [ER-]: Principal | ICD-10-CM

## 2018-08-07 DIAGNOSIS — C50512 Malignant neoplasm of lower-outer quadrant of left female breast: Secondary | ICD-10-CM | POA: Diagnosis not present

## 2018-08-07 IMAGING — US US BREAST BX W LOC DEV 1ST LESION IMG BX SPEC US GUIDE*L*
1 series · 10 of 10 positions shown · non-contrast
Comparison: Previous exam(s).

ADDENDUM:
Pathology revealed Grade II/III INVASIVE DUCTAL CARCINOMA, Left
breast, 4 o'clock. This was found to be concordant by Dr. Kathaleya
Eugenie. Pathology results were discussed with the patient by
telephone. The patient reported doing well after the biopsy with
tenderness at the site. Post biopsy instructions and care were
reviewed and questions were answered. The patient was encouraged to
call The [REDACTED] for any additional
concerns. A surgical referral has been scheduled with Dr Dociene
Robko, on March 22, 2018 per patient request. Imaging and
pathology reports will be faxed to Dr [REDACTED].

Pathology results reported by Tiger, RN on 03/18/2018.
CLINICAL DATA: Patient presents for ultrasound-guided core biopsy
of mass in the 4 o'clock location of the LEFT breast.
EXAM:
ULTRASOUND GUIDED LEFT BREAST CORE NEEDLE BIOPSY

[Series 1: us breast bx w loc dev 1st lesion img bx spec us g · 0.06mm/px · 10 of 10 slices shown]
[im 1/10]
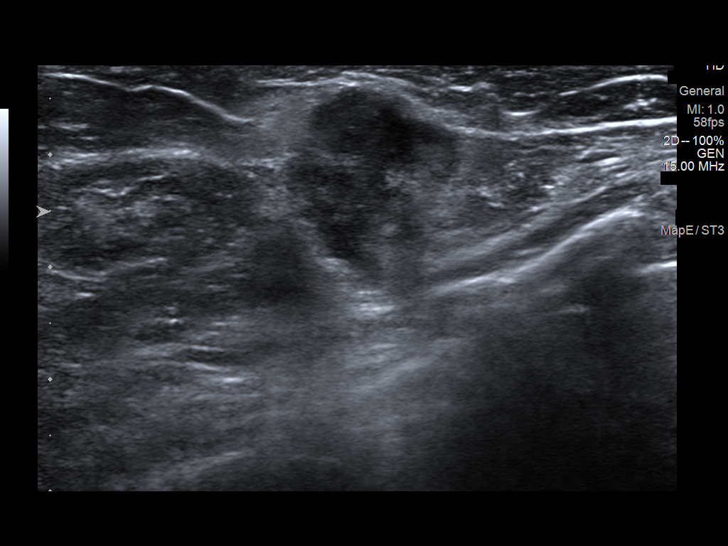
[im 2/10]
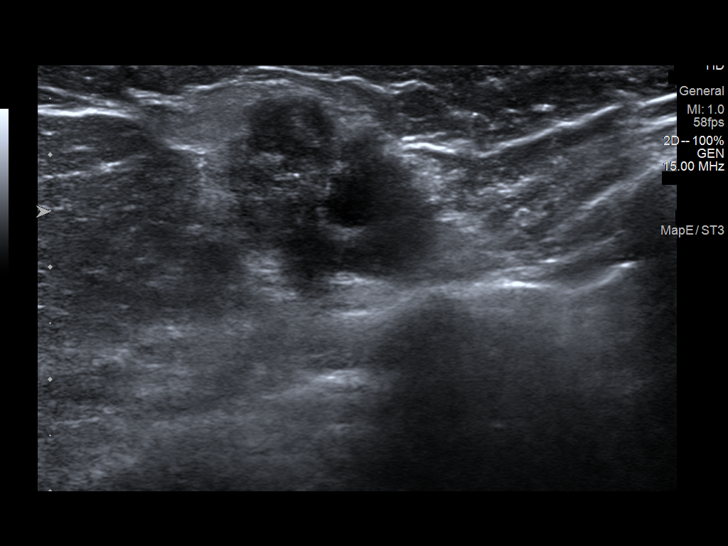
[im 3/10]
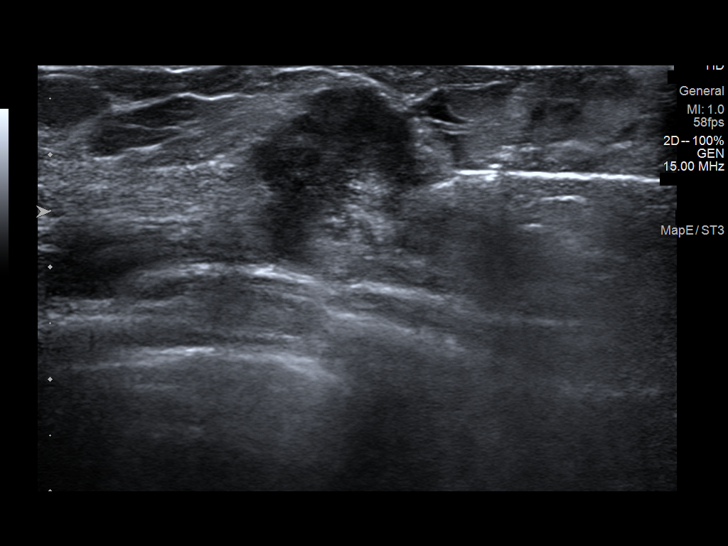
[im 4/10]
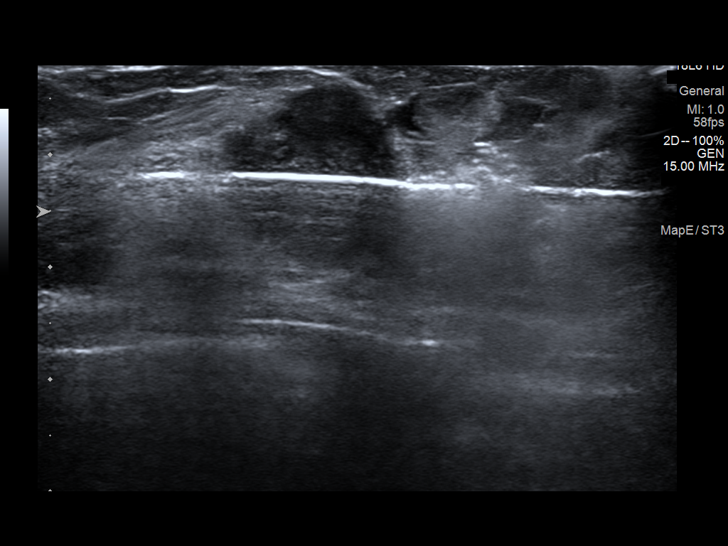
[im 5/10]
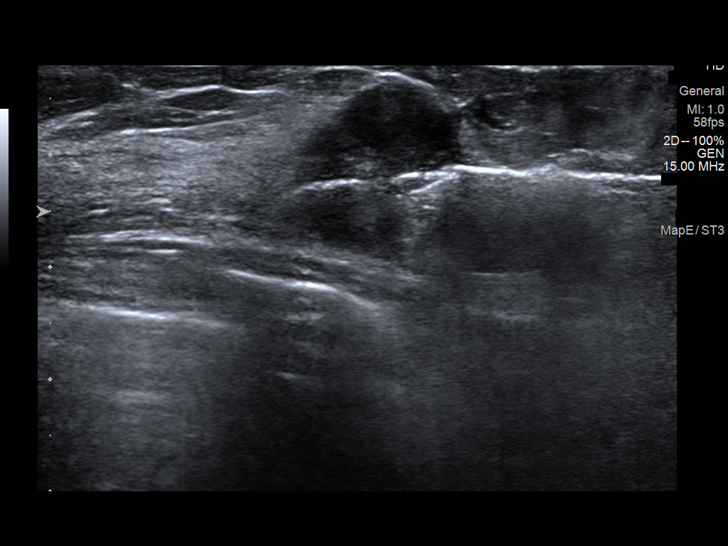
[im 6/10]
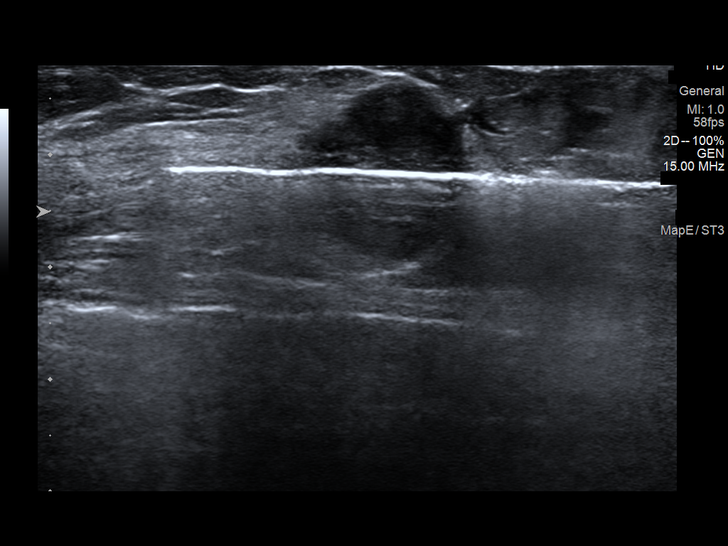
[im 7/10]
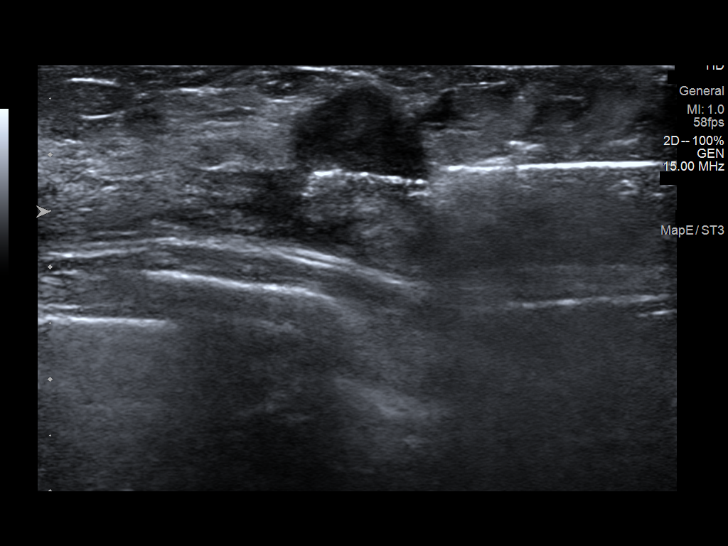
[im 8/10]
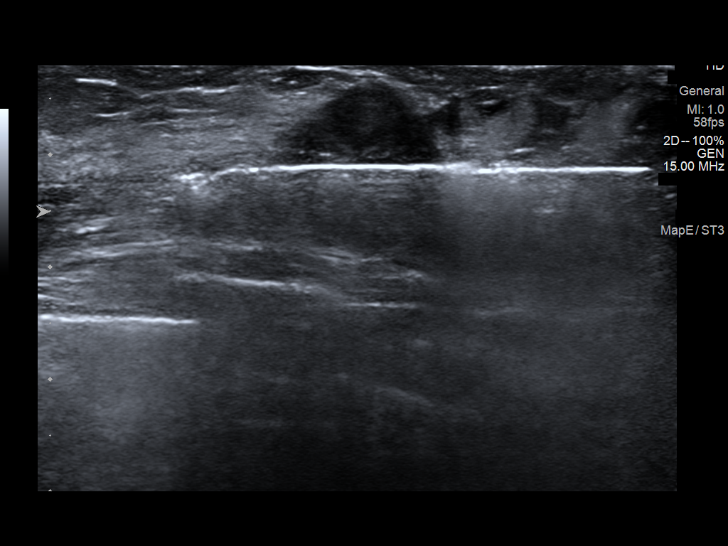
[im 9/10]
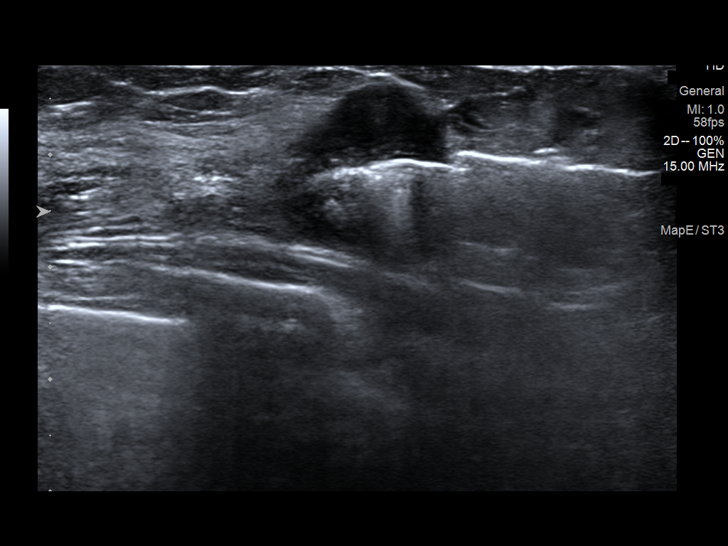
[im 10/10]
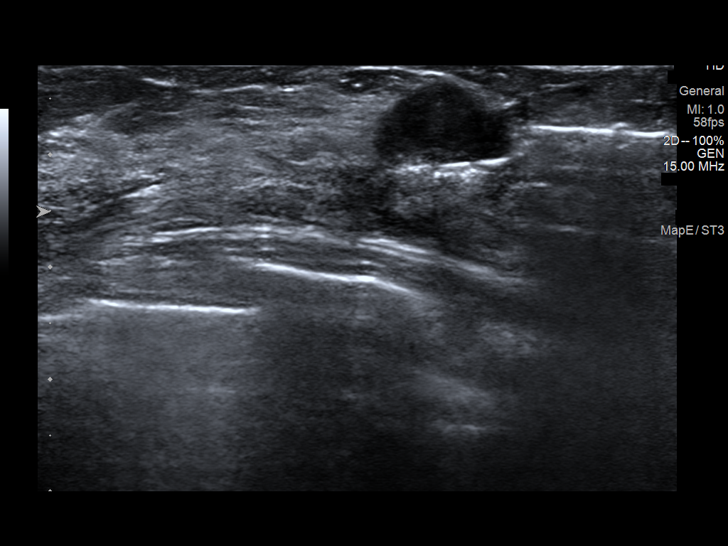

[10 of 10 positions shown; findings below may reference images not displayed]



Lesion quadrant: LOWER OUTER QUADRANT LEFT breast

Using sterile technique and 1% Lidocaine as local anesthetic, under
direct ultrasound visualization, a 12 gauge Olvin Antonio Siles device was
used to perform biopsy of mass in the 4 o'clock location of the LEFT
breast using a cyst LATERAL approach. At the conclusion of the
procedure a heart shaped tissue marker clip was deployed into the
biopsy cavity. Follow up 2 view mammogram was performed and dictated
separately.
IMPRESSION: Ultrasound guided biopsy of LEFT breast mass. No apparent
complications.

## 2018-08-07 IMAGING — MG MM CLIP PLACEMENT
3 series · 3 of 3 positions shown · non-contrast
Comparison: Previous exam(s).

CLINICAL DATA: Status post ultrasound-guided core biopsy of mass in
the 4 o'clock location of the LEFT breast.

EXAM:
DIAGNOSTIC LEFT MAMMOGRAM POST ULTRASOUND BIOPSY

[L ML]
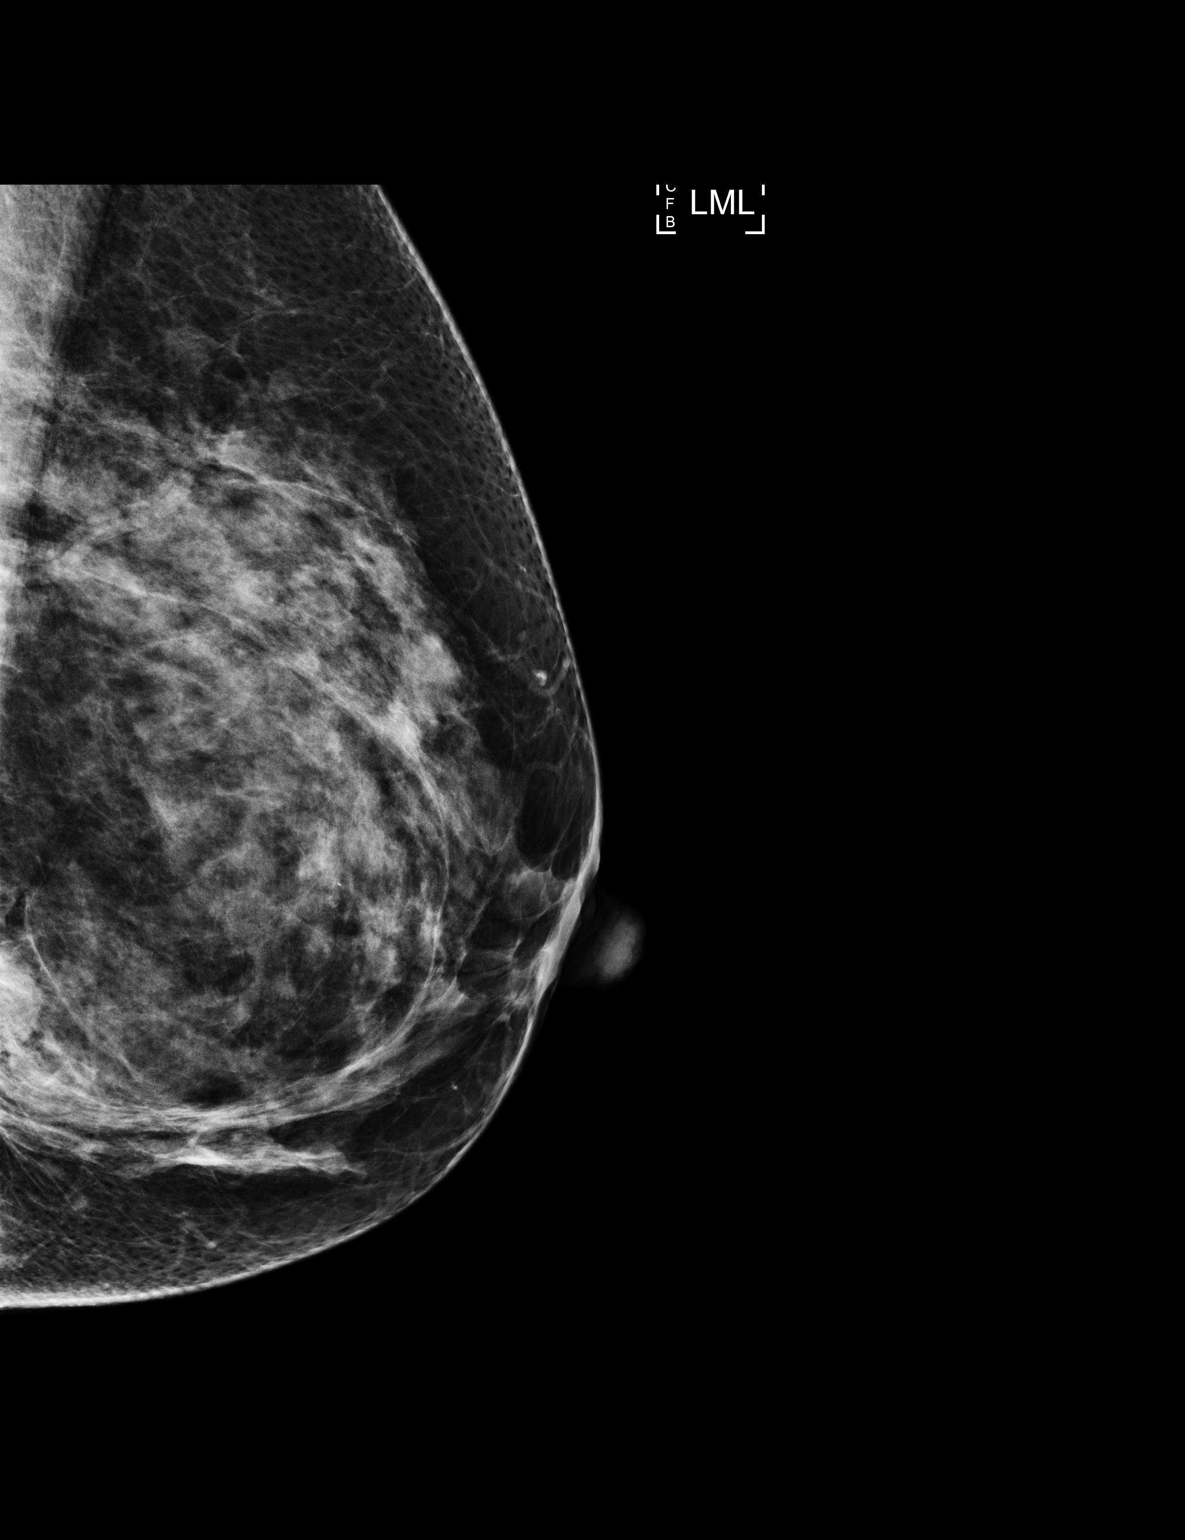

[L CC]
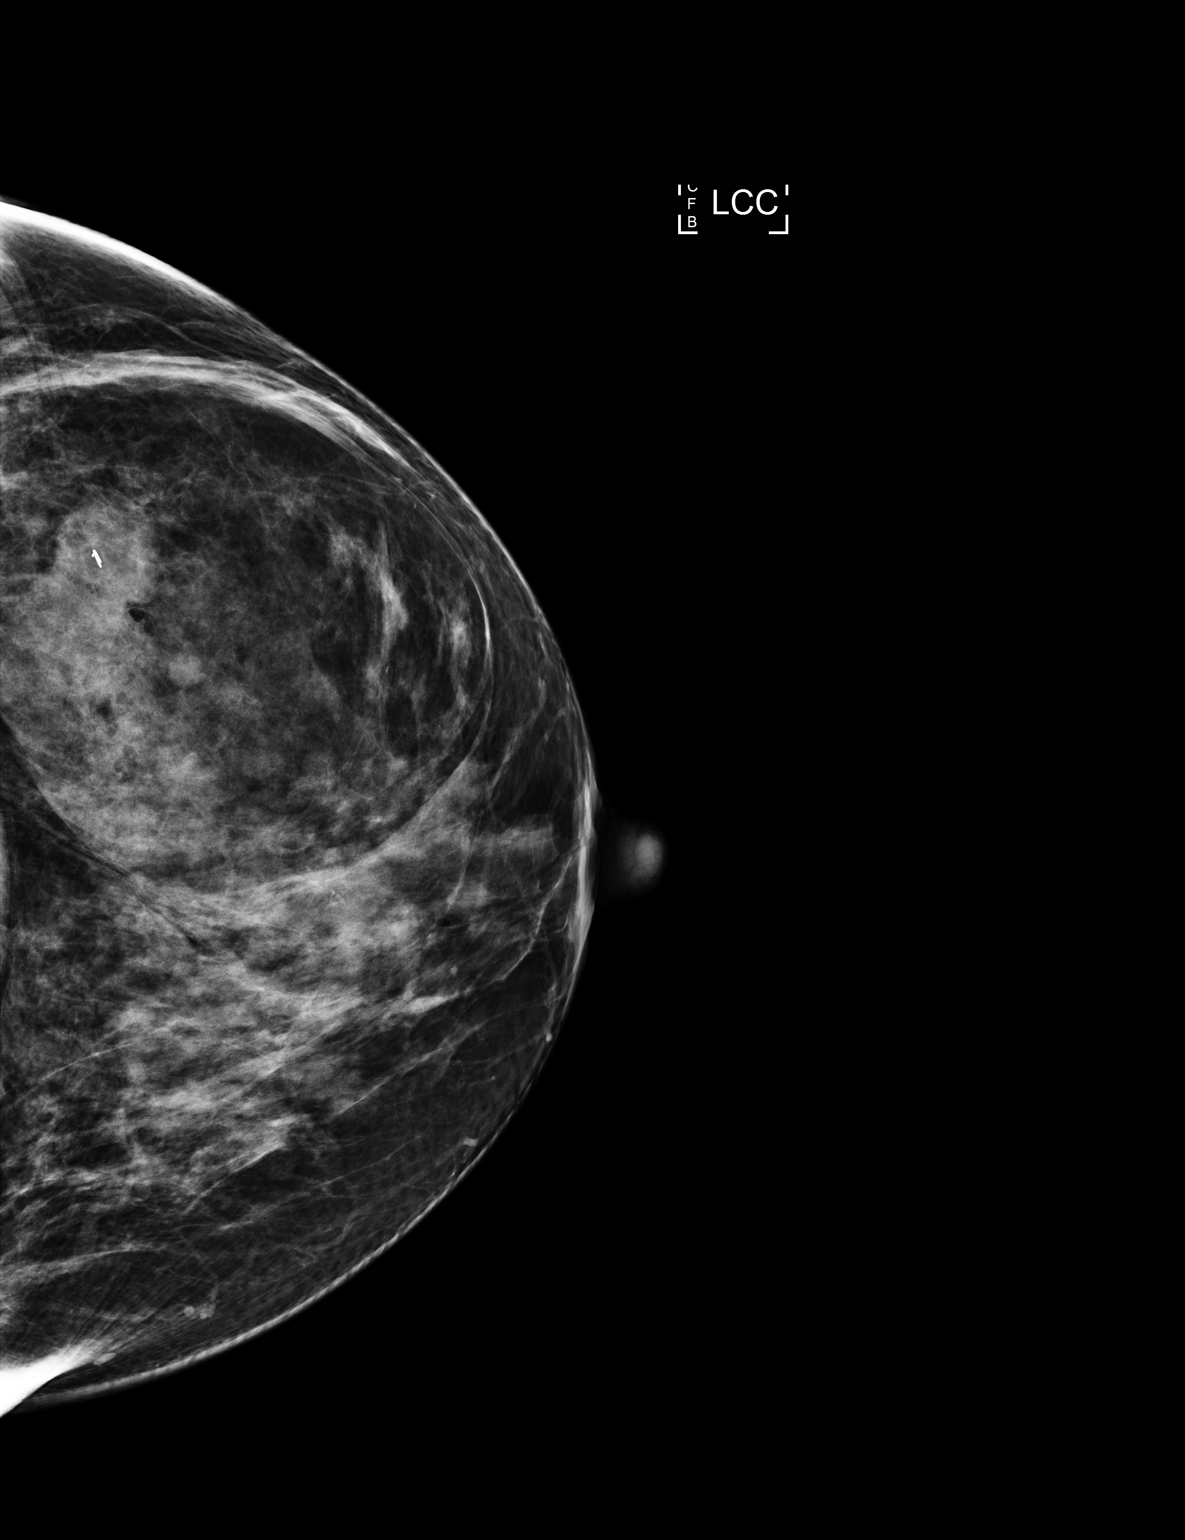

[L LM]
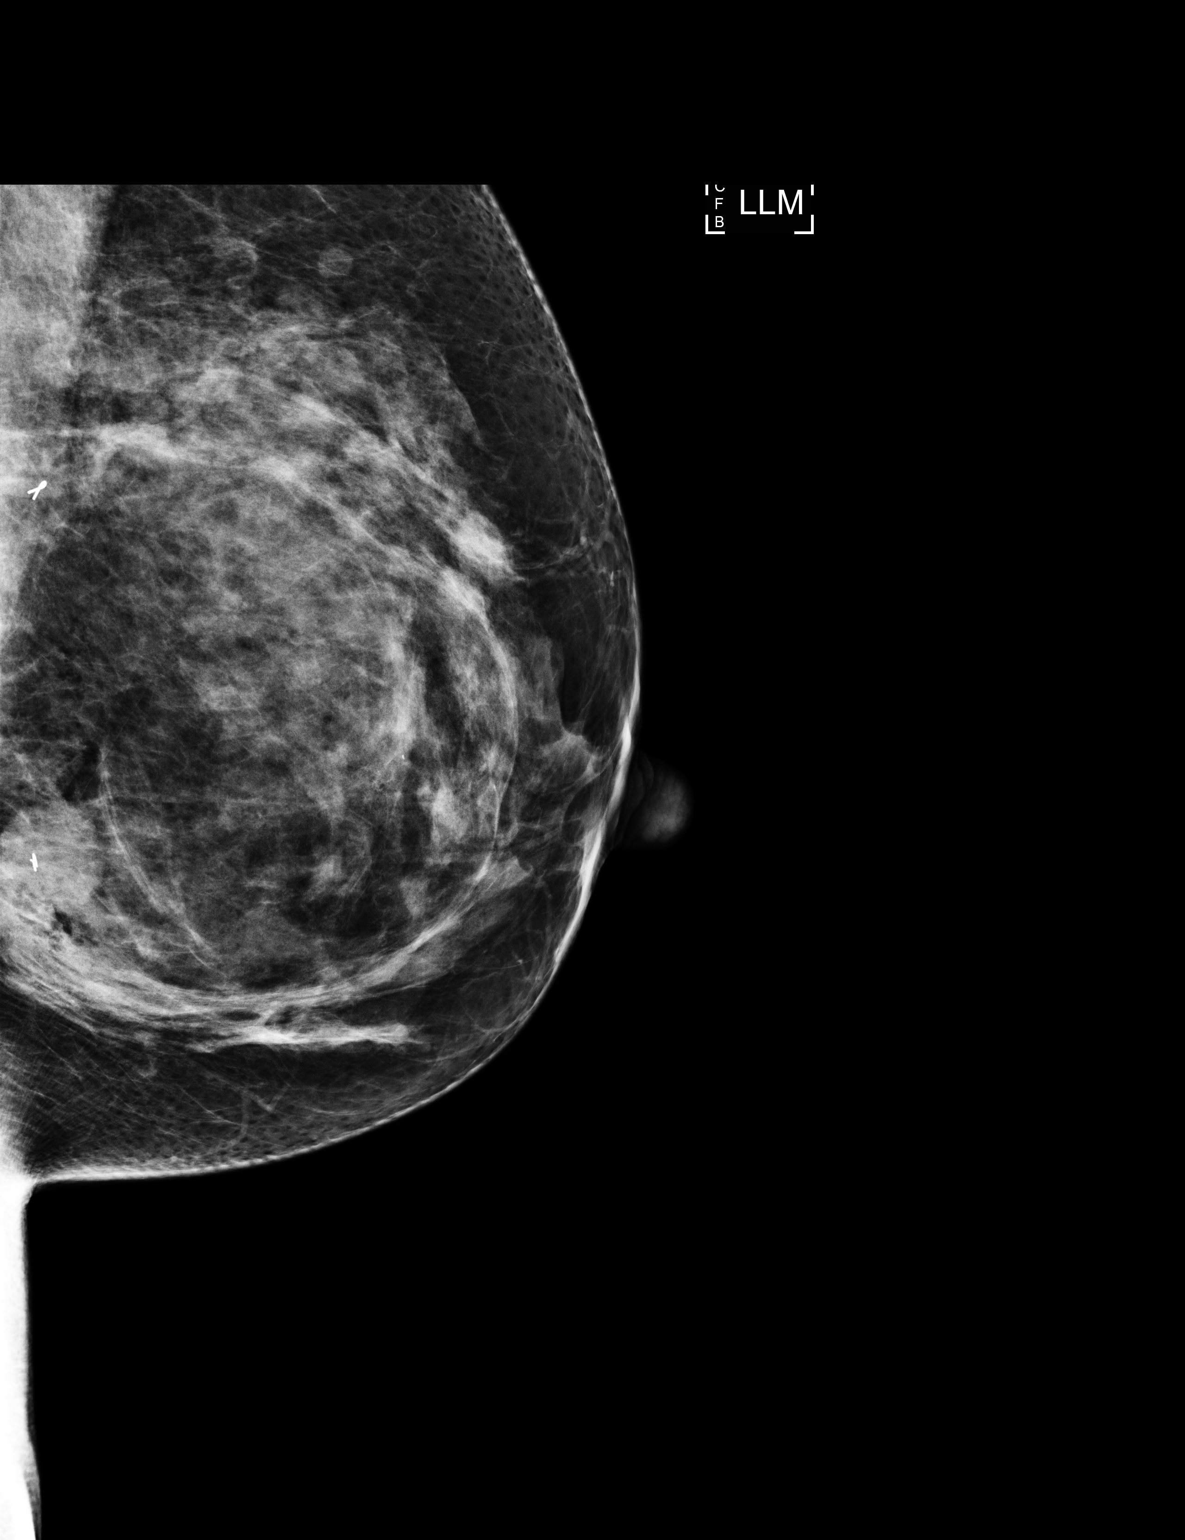

[3 of 3 positions shown; findings below may reference images not displayed]

FINDINGS: Mammographic images were obtained following ultrasound guided biopsy
of mass in the 4 o'clock location of the LEFT breast. A heart shaped
clip is identified in the mass in the LOWER OUTER QUADRANT of the
LEFT breast following biopsy.
IMPRESSION: Tissue marker clip in the expected location following biopsy.

Final Assessment: Post Procedure Mammograms for Marker Placement

## 2018-08-07 MED ORDER — PEGFILGRASTIM-CBQV 6 MG/0.6ML ~~LOC~~ SOSY
6.0000 mg | PREFILLED_SYRINGE | Freq: Once | SUBCUTANEOUS | Status: AC
  Administered 2018-08-07: 6 mg via SUBCUTANEOUS

## 2018-08-07 MED ORDER — PEGFILGRASTIM-CBQV 6 MG/0.6ML ~~LOC~~ SOSY
PREFILLED_SYRINGE | SUBCUTANEOUS | Status: AC
  Filled 2018-08-07: qty 0.6

## 2018-08-07 NOTE — Patient Instructions (Signed)
Pegfilgrastim injection What is this medicine? PEGFILGRASTIM (PEG fil gra stim) is a long-acting granulocyte colony-stimulating factor that stimulates the growth of neutrophils, a type of white blood cell important in the body's fight against infection. It is used to reduce the incidence of fever and infection in patients with certain types of cancer who are receiving chemotherapy that affects the bone marrow, and to increase survival after being exposed to high doses of radiation. This medicine may be used for other purposes; ask your health care provider or pharmacist if you have questions. COMMON BRAND NAME(S): Neulasta What should I tell my health care provider before I take this medicine? They need to know if you have any of these conditions: -kidney disease -latex allergy -ongoing radiation therapy -sickle cell disease -skin reactions to acrylic adhesives (On-Body Injector only) -an unusual or allergic reaction to pegfilgrastim, filgrastim, other medicines, foods, dyes, or preservatives -pregnant or trying to get pregnant -breast-feeding How should I use this medicine? This medicine is for injection under the skin. If you get this medicine at home, you will be taught how to prepare and give the pre-filled syringe or how to use the On-body Injector. Refer to the patient Instructions for Use for detailed instructions. Use exactly as directed. Tell your healthcare provider immediately if you suspect that the On-body Injector may not have performed as intended or if you suspect the use of the On-body Injector resulted in a missed or partial dose. It is important that you put your used needles and syringes in a special sharps container. Do not put them in a trash can. If you do not have a sharps container, call your pharmacist or healthcare provider to get one. Talk to your pediatrician regarding the use of this medicine in children. While this drug may be prescribed for selected conditions,  precautions do apply. Overdosage: If you think you have taken too much of this medicine contact a poison control center or emergency room at once. NOTE: This medicine is only for you. Do not share this medicine with others. What if I miss a dose? It is important not to miss your dose. Call your doctor or health care professional if you miss your dose. If you miss a dose due to an On-body Injector failure or leakage, a new dose should be administered as soon as possible using a single prefilled syringe for manual use. What may interact with this medicine? Interactions have not been studied. Give your health care provider a list of all the medicines, herbs, non-prescription drugs, or dietary supplements you use. Also tell them if you smoke, drink alcohol, or use illegal drugs. Some items may interact with your medicine. This list may not describe all possible interactions. Give your health care provider a list of all the medicines, herbs, non-prescription drugs, or dietary supplements you use. Also tell them if you smoke, drink alcohol, or use illegal drugs. Some items may interact with your medicine. What should I watch for while using this medicine? You may need blood work done while you are taking this medicine. If you are going to need a MRI, CT scan, or other procedure, tell your doctor that you are using this medicine (On-Body Injector only). What side effects may I notice from receiving this medicine? Side effects that you should report to your doctor or health care professional as soon as possible: -allergic reactions like skin rash, itching or hives, swelling of the face, lips, or tongue -dizziness -fever -pain, redness, or irritation at site   where injected -pinpoint red spots on the skin -red or dark-brown urine -shortness of breath or breathing problems -stomach or side pain, or pain at the shoulder -swelling -tiredness -trouble passing urine or change in the amount of urine Side  effects that usually do not require medical attention (report to your doctor or health care professional if they continue or are bothersome): -bone pain -muscle pain This list may not describe all possible side effects. Call your doctor for medical advice about side effects. You may report side effects to FDA at 1-800-FDA-1088. Where should I keep my medicine? Keep out of the reach of children. Store pre-filled syringes in a refrigerator between 2 and 8 degrees C (36 and 46 degrees F). Do not freeze. Keep in carton to protect from light. Throw away this medicine if it is left out of the refrigerator for more than 48 hours. Throw away any unused medicine after the expiration date. NOTE: This sheet is a summary. It may not cover all possible information. If you have questions about this medicine, talk to your doctor, pharmacist, or health care provider.  2018 Elsevier/Gold Standard (2016-12-11 12:58:03)  

## 2018-08-09 ENCOUNTER — Encounter: Payer: Self-pay | Admitting: Adult Health

## 2018-08-12 ENCOUNTER — Ambulatory Visit (HOSPITAL_COMMUNITY)
Admission: RE | Admit: 2018-08-12 | Discharge: 2018-08-12 | Disposition: A | Discharge status: Home / Self Care | Payer: BC Managed Care – PPO | Source: Ambulatory Visit | Attending: Oncology | Admitting: Oncology

## 2018-08-12 DIAGNOSIS — Z79899 Other long term (current) drug therapy: Secondary | ICD-10-CM | POA: Diagnosis not present

## 2018-08-12 DIAGNOSIS — Z9221 Personal history of antineoplastic chemotherapy: Secondary | ICD-10-CM | POA: Insufficient documentation

## 2018-08-12 DIAGNOSIS — Z5181 Encounter for therapeutic drug level monitoring: Secondary | ICD-10-CM | POA: Diagnosis present

## 2018-08-12 NOTE — Progress Notes (Signed)
  Echocardiogram 2D Echocardiogram has been performed.  Monica Padilla F 08/12/2018, 11:02 AM

## 2018-08-13 ENCOUNTER — Other Ambulatory Visit: Payer: Self-pay | Admitting: Oncology

## 2018-08-16 ENCOUNTER — Telehealth: Payer: Self-pay

## 2018-08-16 NOTE — Telephone Encounter (Signed)
Spoke with patient per [facility] ok not to have counts checked. Patient doing fine and voiced understanding.  Would like some one to call concerning her echo results from Aug.15th.

## 2018-08-17 ENCOUNTER — Other Ambulatory Visit: Payer: Self-pay | Admitting: Oncology

## 2018-08-18 ENCOUNTER — Telehealth: Payer: Self-pay | Admitting: Adult Health

## 2018-08-18 NOTE — Telephone Encounter (Signed)
Called and gave patient her echocardiogram results.  Reviewed her plan with her regarding chemotherapy and then radiation referral.  Patient verbalized understanding, will review further on 8/29 at her next appointment.  Wilber Bihari, NP

## 2018-08-25 NOTE — Progress Notes (Signed)
[place]  [phone]) [phone] [phone]) [phone]     ID: Monica Padilla DOB: [birth year]  MR#: [id number]  DVZ#:[id number]  Patient Care Team: Monica Greenhouse, MD as PCP - General (Family Medicine) Monica Colt, MD as Referring Physician (Obstetrics and Gynecology) Monica Padilla, Monica Dad, MD as Consulting Physician (Oncology) Monica Dresser, MD as Consulting Physician (Cardiology) Monica Retort Juanda Bond., MD as Referring Physician (Surgery) Monica Mayer, MD as Consulting Physician (Gastroenterology) Monica Nations, MD as Consulting Physician (Diagnostic Radiology) OTHER MD: Dr. Jimmye Padilla and Dr. Michele Padilla at [facility]: Estrogen receptor negative, HER-2 positive breast cancer  CURRENT TREATMENT: Chemo/immunotherapy  INTERVAL HISTORY: Monica Padilla returns today for a follow-up and treatment of her estrogen receptor negative, HER-2 positive breast cancer accompanied by her husband. She receives Carboplatin and docetaxel, trastuzumab, and pertuzumab every 21 days. Today is day 1 cycle 6 of her chemotherapy.   REVIEW OF SYSTEMS: Monica Padilla is feeling well today.  She notes that she has continued to not have a rash since the discontinuation of Pertuzumab.  Monica Padilla has mild tingling of her fingertips and toes, this is intermittnet.  She says that it will start after she first gets her chemotherapy, and then it will resolve.  She is not experiencing the neuropathy today.  She last noted it a week ago, and nothing since.    Monica Padilla notes frequent tearing in her eyes and wants to know what to do with this.  Otherwise she is doing well today and is ready to proceed with her last cycle of chemotherapy. She denies fevers, chills, nausea, vomiting, constipation, diarrhea, headaches, or any other issues.  A detailed ROS is otherwise non contributory.    HISTORY OF CURRENT ILLNESS: From the original intake note:  The patient noted a palpable mass in her left  breast as she was laying down. She brought this to medical attention immediately and had left diagnostic mammography with tomography at breast center  03/11/2018 showing a 2.5 cm palpable mass in the lower outer quadrant of the left breast, with one slightly prominent lymph node that had increased cortical measurement.  Biopsy of this left breast mass 03/17/2018 showed ([id number]), invasive ductal carcinoma, grade 2, estrogen and progesterone receptor negative, with an MIB-1 of 80%, but HER-2 amplified with a signals ratio 5.51 and the number per cell 17.35.  She underwent left mastectomy and sentinel lymph node sampling, as well as right-sided port placement 04/02/2018 under [facility] at [facility] for a 2.6 cm invasive ductal carcinoma, grade 3, with 1 of 3 sentinel nodes showing a micrometastatic deposit, but all margins negative.  She had a port placed at the same time.  The patient's subsequent history is as detailed below.    PAST MEDICAL HISTORY: Past Medical History:  Diagnosis Date  . Breast cancer ([facility])   . Cancer ([facility])    basal cell CA- chest, leg  . Family history of breast cancer   . Family history of colon cancer   . Family history of lung cancer   . GERD (gastroesophageal reflux disease)   s.   PAST SURGICAL HISTORY: Past Surgical History:  Procedure Laterality Date  . PARTIAL HYSTERECTOMY    . SKIN CANCER EXCISION      FAMILY HISTORY Family History  Problem Relation Age of Onset  . Colon cancer Father 54  . Basal cell carcinoma Sister        2-3  . Lung cancer Maternal Aunt  hx smoking  . Lung cancer Paternal Uncle        hx smoking  . Heart disease Maternal Grandmother   . Heart disease Maternal Grandfather   . Cancer Paternal Grandfather        unk if colon or stomach? died in early 43's.   . Esophageal cancer Neg Hx   . Stomach cancer Neg Hx   . Rectal cancer Neg Hx    The patient's father died due to colon cancer at age 36. The patient's  mother is alive at age 34. The patient has 1 sister and no brothers. A paternal cousin was diagnosed with breast cancer in the early 47's (before 34). The patient's paternal grandfather passed away from colon cancer. The patient's father had 3 brothers, one of whom had lung cancer.   GYNECOLOGIC HISTORY:  No LMP recorded. Patient has had a hysterectomy. Menarche: 52 years old Age at first live birth: 52 years old She is GXP2.  The patient is status post partial hysterectomy due to uterine fibroids. She retains her cervix and ovaries.  After her hysterectomy she used Estradiol with no complications.  She stopped hormone replacement March 2019.  She is now having hot flashes and nigh sweats that only allow her to sleep 3 hours at a time.   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 Her husband, Monica Padilla, is a Hydrologist for alarms and cameras for [employer]. The patient's oldest son, Monica Padilla, is 6 and works as a Civil engineer, contracting in [place]. The patient's son, Monica Padilla, is 72 an works for a Dispensing optician. Monica Padilla lives at home with the patient. Monica Padilla does not have any grandchildren. She attends [employer].     ADVANCED DIRECTIVES:    HEALTH MAINTENANCE: Social History   Tobacco Use  . Smoking status: Never Smoker  . Smokeless tobacco: Never Used  Substance Use Topics  . Alcohol use: Yes    Comment: occasional  . Drug use: No     Colonoscopy: 10/04/2013/ Dr. Carlean Purl / polyp removal  PAP:  Bone density: remote   Allergies  Allergen Reactions  . Codeine Itching  . Nitrofurantoin     REACTION: pruritis  . Amoxicillin Rash    Current Outpatient Medications  Medication Sig Dispense Refill  . Ascorbic Acid (VITAMIN C PO) Take 500 mg by mouth daily. With rose hips    . Cholecalciferol (VITAMIN D-1000 MAX ST) 1000 units tablet Take by mouth.    . CRANBERRY PO Take by mouth daily.    Marland Kitchen dexamethasone (DECADRON) 4 MG tablet  START DAY BEFORE TAXOTERE. TAKE 2 TABS BY MOUTH TWICE DAILY, TAKE ONETHE DAY AFTER THEN TWICE DAILY FOR 2 MORE DAYS 30 tablet 1  . doxycycline (VIBRA-TABS) 100 MG tablet Take 1 tablet (100 mg total) by mouth daily. 20 tablet 1  . doxycycline (VIBRAMYCIN) 100 MG capsule Take 1 capsule (100 mg total) by mouth 2 (two) times daily. 20 capsule 0  . lidocaine-prilocaine (EMLA) cream Apply to affected area once 30 g 3  . LORazepam (ATIVAN) 0.5 MG tablet TAKE ONE TABLET DAILY AT BEDTIME AS NEEDED FOR ANXIETY 30 tablet 1  . methylPREDNISolone (MEDROL DOSEPAK) 4 MG TBPK tablet 6 x 1 day, 5 x 1 day, 4 x 1 day, 3 x 1 day, 2 x 1 day, 1 x 1 day 21 tablet 0  . pantoprazole (PROTONIX) 40 MG tablet Take by mouth.    . prochlorperazine (COMPAZINE) 10  MG tablet Take 1 tablet (10 mg total) by mouth every 6 (six) hours as needed (Nausea or vomiting). 30 tablet 1  . gabapentin (NEURONTIN) 300 MG capsule Take 1 capsule (300 mg total) by mouth at bedtime. (Patient not taking: Reported on 08/26/2018) 90 capsule 4   No current facility-administered medications for this visit.     OBJECTIVE: Vitals:   08/26/18 1032  BP: 125/69  Pulse: 79  Resp: 18  Temp: 97.9 F (36.6 C)  SpO2: 100%     Body mass index is 24.88 kg/m.   Wt Readings from Last 3 Encounters:  08/26/18 131 lb 11.2 oz (59.7 kg)  06/30/18 124 lb 14.4 oz (56.7 kg)  06/14/18 126 lb 12.8 oz (57.5 kg)  ECOG FS:1 GENERAL: Patient is a well appearing female in no acute distress HEENT:  Sclerae anicteric.  Oropharynx clear and moist. No ulcerations or evidence of oropharyngeal candidiasis. Neck is supple.  NODES:  No cervical, supraclavicular, or axillary lymphadenopathy palpated.  BREAST EXAM:  Deferred. LUNGS:  Clear to auscultation bilaterally.  No wheezes or rhonchi. HEART:  Regular rate and rhythm. No murmur appreciated. ABDOMEN:  Soft, nontender.  Positive, normoactive bowel sounds. No organomegaly palpated. MSK:  No focal spinal tenderness to  palpation. Full range of motion bilaterally in the upper extremities. EXTREMITIES:  No peripheral edema.   SKIN:  Clear with no obvious rashes or skin changes. No nail dyscrasia. NEURO:  Nonfocal. Well oriented.  Appropriate affect.     LAB RESULTS:  CMP     Component Value Date/Time   NA 141 08/26/2018 0918   K 4.1 08/26/2018 0918   CL 108 08/26/2018 0918   CO2 24 08/26/2018 0918   GLUCOSE 139 (H) 08/26/2018 0918   BUN 14 08/26/2018 0918   CREATININE 0.71 08/26/2018 0918   CALCIUM 10.0 08/26/2018 0918   PROT 6.5 08/26/2018 0918   ALBUMIN 3.9 08/26/2018 0918   AST 28 08/26/2018 0918   ALT 49 (H) 08/26/2018 0918   ALKPHOS 104 08/26/2018 0918   BILITOT 0.3 08/26/2018 0918   GFRNONAA >60 08/26/2018 0918   GFRAA >60 08/26/2018 0918    No results found for: TOTALPROTELP, ALBUMINELP, A1GS, A2GS, BETS, BETA2SER, GAMS, MSPIKE, SPEI  No results found for: KPAFRELGTCHN, LAMBDASER, KAPLAMBRATIO  Lab Results  Component Value Date   WBC 7.1 08/26/2018   NEUTROABS 5.9 08/26/2018   HGB 10.4 (L) 08/26/2018   HCT 31.2 (L) 08/26/2018   MCV 96.4 08/26/2018   PLT 218 08/26/2018    _0 @  No results found for: LABCA2  No components found for: [id number]  No results for input(s): INR in the last 168 hours.  No results found for: LABCA2  No results found for: ZJQ734  No results found for: LPF790  No results found for: WIO973  No results found for: CA2729  No components found for: HGQUANT  No results found for: CEA1 / No results found for: CEA1   No results found for: AFPTUMOR  No results found for: CHROMOGRNA  No results found for: PSA1  Appointment on 08/26/2018  Component Date Value Ref Range Status  . Sodium 08/26/2018 141  135 - 145 mmol/L Final  . Potassium 08/26/2018 4.1  3.5 - 5.1 mmol/L Final  . Chloride 08/26/2018 108  98 - 111 mmol/L Final  . CO2 08/26/2018 24  22 - 32 mmol/L Final  . Glucose, Bld 08/26/2018 139* 70 - 99 mg/dL Final  . BUN  08/26/2018 14  6 - 20 mg/dL  Final  . Creatinine, Ser 08/26/2018 0.71  0.44 - 1.00 mg/dL Final  . Calcium 08/26/2018 10.0  8.9 - 10.3 mg/dL Final  . Total Protein 08/26/2018 6.5  6.5 - 8.1 g/dL Final  . Albumin 08/26/2018 3.9  3.5 - 5.0 g/dL Final  . AST 08/26/2018 28  15 - 41 U/L Final  . ALT 08/26/2018 49* 0 - 44 U/L Final  . Alkaline Phosphatase 08/26/2018 104  38 - 126 U/L Final  . Total Bilirubin 08/26/2018 0.3  0.3 - 1.2 mg/dL Final  . GFR calc non Af Amer 08/26/2018 >60  >60 mL/min Final  . GFR calc Af Amer 08/26/2018 >60  >60 mL/min Final   Comment: (NOTE) The eGFR has been calculated using the CKD EPI equation. This calculation has not been validated in all clinical situations. eGFR's persistently <60 mL/min signify possible Chronic Kidney Disease.   Georgiann Hahn gap 08/26/2018 9  5 - 15 Final   Performed at [facility], [place] [address] [place], [place] [address]  . WBC 08/26/2018 7.1  3.9 - 10.3 K/uL Final  . RBC 08/26/2018 3.24* 3.70 - 5.45 MIL/uL Final  . Hemoglobin 08/26/2018 10.4* 11.6 - 15.9 g/dL Final  . HCT 08/26/2018 31.2* 34.8 - 46.6 % Final  . MCV 08/26/2018 96.4  79.5 - 101.0 fL Final  . MCH 08/26/2018 32.3  25.1 - 34.0 pg Final  . MCHC 08/26/2018 33.4  31.5 - 36.0 g/dL Final  . RDW 08/26/2018 16.3* 11.2 - 14.5 % Final  . Platelets 08/26/2018 218  145 - 400 K/uL Final  . Neutrophils Relative % 08/26/2018 84  % Final  . Neutro Abs 08/26/2018 5.9  1.5 - 6.5 K/uL Final  . Lymphocytes Relative 08/26/2018 8  % Final  . Lymphs Abs 08/26/2018 0.6* 0.9 - 3.3 K/uL Final  . Monocytes Relative 08/26/2018 8  % Final  . Monocytes Absolute 08/26/2018 0.6  0.1 - 0.9 K/uL Final  . Eosinophils Relative 08/26/2018 0  % Final  . Eosinophils Absolute 08/26/2018 0.0  0.0 - 0.5 K/uL Final  . Basophils Relative 08/26/2018 0  % Final  . Basophils Absolute 08/26/2018 0.0  0.0 - 0.1 K/uL Final   Performed at [facility], [place] [address], [place], [place] [address]    (this displays the last labs from the last 3 days)  No results found for: TOTALPROTELP, ALBUMINELP, A1GS, A2GS, BETS, BETA2SER, GAMS, MSPIKE, SPEI (this displays SPEP labs)  No results found for: KPAFRELGTCHN, LAMBDASER, KAPLAMBRATIO (kappa/lambda light chains)  No results found for: HGBA, HGBA2QUANT, HGBFQUANT, HGBSQUAN (Hemoglobinopathy evaluation)   No results found for: LDH  No results found for: IRON, TIBC, IRONPCTSAT (Iron and TIBC)  No results found for: FERRITIN  Urinalysis No results found for: COLORURINE, APPEARANCEUR, LABSPEC, PHURINE, GLUCOSEU, HGBUR, BILIRUBINUR, KETONESUR, PROTEINUR, UROBILINOGEN, NITRITE, LEUKOCYTESUR   STUDIES: No results found.  ELIGIBLE FOR AVAILABLE RESEARCH PROTOCOL: not discussed  ASSESSMENT: 52 y.o. [place], [place] woman status post left mastectomy and sentinel lymph node sampling 04/02/2018 for a pT2 pN1, stage IIB invasive ductal carcinoma, grade 3, estrogen and progesterone receptor negative, but HER-2 amplified  (1) genetics testing 06/15/2018  (2) adjuvant chemotherapy consisting of carboplatin and docetaxel given every 21 days x 6, started 05/13/2018, gemcitabine substituted for Taxotere with cycle 6 due to neuropathy  (3) trastuzumab and Pertuzumab will started 05/13/2018 with chemotherapy and continue for 6 months  (a) echocardiogram 05/06/2018 shows an ejection fraction in the 65-70%  (b)  echocardiogram in 08/12/2018 shows EF of 65-70%  (4) adjuvant radiation to follow  (5) referral to plastics placed 05/03/2018    PLAN: Feliciana is doing well today.  Her labs are stable and I reviewed them with her in detail.  She will proceed with treatment today.  I reviewed her intermittent currently resolved neuropathy with Dr. Jana Hakim.  He will replace the Docetaxel with Gemcitabine today due to it.  I reviewed this and possible adverse effects with Lilly[place]na and her husband in detail.    We also reviewed  that I would refer her to radiation today, along with cardio oncology.  She is in agreement with both.  She will continue to return every 3 weeks for trastuzumab.  We will see her back at that time.    She has a good understanding of this plan.  She knows to call for any problems that may develop before the next visit.  A total of (30) minutes of face-to-face time was spent with this patient with greater than 50% of that time in counseling and care-coordination.   Wilber Bihari, NP 08/26/18 11:18 AM Medical Oncology and Hematology [facility] [address] [place], [place] [address] Tel. [phone]    Fax. [phone]

## 2018-08-26 ENCOUNTER — Inpatient Hospital Stay: Payer: BC Managed Care – PPO

## 2018-08-26 ENCOUNTER — Other Ambulatory Visit: Payer: BC Managed Care – PPO

## 2018-08-26 ENCOUNTER — Encounter: Payer: Self-pay | Admitting: Adult Health

## 2018-08-26 ENCOUNTER — Inpatient Hospital Stay (HOSPITAL_BASED_OUTPATIENT_CLINIC_OR_DEPARTMENT_OTHER): Payer: BC Managed Care – PPO | Admitting: Adult Health

## 2018-08-26 VITALS — BP 125/69 | HR 79 | Temp 97.9°F | Resp 18 | Ht 61.0 in | Wt 131.7 lb

## 2018-08-26 DIAGNOSIS — Z171 Estrogen receptor negative status [ER-]: Principal | ICD-10-CM

## 2018-08-26 DIAGNOSIS — Z9012 Acquired absence of left breast and nipple: Secondary | ICD-10-CM

## 2018-08-26 DIAGNOSIS — Z803 Family history of malignant neoplasm of breast: Secondary | ICD-10-CM | POA: Diagnosis not present

## 2018-08-26 DIAGNOSIS — C50512 Malignant neoplasm of lower-outer quadrant of left female breast: Secondary | ICD-10-CM

## 2018-08-26 DIAGNOSIS — Z801 Family history of malignant neoplasm of trachea, bronchus and lung: Secondary | ICD-10-CM

## 2018-08-26 DIAGNOSIS — Z800 Family history of malignant neoplasm of digestive organs: Secondary | ICD-10-CM

## 2018-08-26 LAB — CBC WITH DIFFERENTIAL/PLATELET
BASOS ABS: 0 10*3/uL (ref 0.0–0.1)
Basophils Relative: 0 %
Eosinophils Absolute: 0 10*3/uL (ref 0.0–0.5)
Eosinophils Relative: 0 %
HCT: 31.2 % — ABNORMAL LOW (ref 34.8–46.6)
Hemoglobin: 10.4 g/dL — ABNORMAL LOW (ref 11.6–15.9)
LYMPHS PCT: 8 %
Lymphs Abs: 0.6 10*3/uL — ABNORMAL LOW (ref 0.9–3.3)
MCH: 32.3 pg (ref 25.1–34.0)
MCHC: 33.4 g/dL (ref 31.5–36.0)
MCV: 96.4 fL (ref 79.5–101.0)
MONO ABS: 0.6 10*3/uL (ref 0.1–0.9)
Monocytes Relative: 8 %
NEUTROS ABS: 5.9 10*3/uL (ref 1.5–6.5)
NEUTROS PCT: 84 %
PLATELETS: 218 10*3/uL (ref 145–400)
RBC: 3.24 MIL/uL — AB (ref 3.70–5.45)
RDW: 16.3 % — AB (ref 11.2–14.5)
WBC: 7.1 10*3/uL (ref 3.9–10.3)

## 2018-08-26 LAB — COMPREHENSIVE METABOLIC PANEL
ALBUMIN: 3.9 g/dL (ref 3.5–5.0)
ALT: 49 U/L — ABNORMAL HIGH (ref 0–44)
ANION GAP: 9 (ref 5–15)
AST: 28 U/L (ref 15–41)
Alkaline Phosphatase: 104 U/L (ref 38–126)
BILIRUBIN TOTAL: 0.3 mg/dL (ref 0.3–1.2)
BUN: 14 mg/dL (ref 6–20)
CALCIUM: 10 mg/dL (ref 8.9–10.3)
CO2: 24 mmol/L (ref 22–32)
Chloride: 108 mmol/L (ref 98–111)
Creatinine, Ser: 0.71 mg/dL (ref 0.44–1.00)
GFR calc non Af Amer: >60 mL/min (ref >60)
GLUCOSE: 139 mg/dL — AB (ref 70–99)
POTASSIUM: 4.1 mmol/L (ref 3.5–5.1)
Sodium: 141 mmol/L (ref 135–145)
TOTAL PROTEIN: 6.5 g/dL (ref 6.5–8.1)

## 2018-08-26 MED ORDER — SODIUM CHLORIDE 0.9% FLUSH
10.0000 mL | INTRAVENOUS | Status: DC | PRN
  Administered 2018-08-26: 10 mL
  Filled 2018-08-26: qty 10

## 2018-08-26 MED ORDER — ACETAMINOPHEN 325 MG PO TABS
ORAL_TABLET | ORAL | Status: AC
  Filled 2018-08-26: qty 2

## 2018-08-26 MED ORDER — DIPHENHYDRAMINE HCL 25 MG PO CAPS
ORAL_CAPSULE | ORAL | Status: AC
  Filled 2018-08-26: qty 2

## 2018-08-26 MED ORDER — SODIUM CHLORIDE 0.9 % IV SOLN
500.0000 mg | Freq: Once | INTRAVENOUS | Status: AC
  Administered 2018-08-26: 500 mg via INTRAVENOUS
  Filled 2018-08-26: qty 50

## 2018-08-26 MED ORDER — ACETAMINOPHEN 325 MG PO TABS
650.0000 mg | ORAL_TABLET | Freq: Once | ORAL | Status: AC
  Administered 2018-08-26: 650 mg via ORAL

## 2018-08-26 MED ORDER — HEPARIN SOD (PORK) LOCK FLUSH 100 UNIT/ML IV SOLN
500.0000 [IU] | Freq: Once | INTRAVENOUS | Status: AC | PRN
  Administered 2018-08-26: 500 [IU]
  Filled 2018-08-26: qty 5

## 2018-08-26 MED ORDER — TRASTUZUMAB CHEMO 150 MG IV SOLR
6.0000 mg/kg | Freq: Once | INTRAVENOUS | Status: AC
  Administered 2018-08-26: 336 mg via INTRAVENOUS
  Filled 2018-08-26: qty 16

## 2018-08-26 MED ORDER — SODIUM CHLORIDE 0.9 % IV SOLN
Freq: Once | INTRAVENOUS | Status: AC
  Administered 2018-08-26: 12:00:00 via INTRAVENOUS
  Filled 2018-08-26: qty 5

## 2018-08-26 MED ORDER — SODIUM CHLORIDE 0.9 % IV SOLN
Freq: Once | INTRAVENOUS | Status: AC
  Administered 2018-08-26: 12:00:00 via INTRAVENOUS
  Filled 2018-08-26: qty 250

## 2018-08-26 MED ORDER — PALONOSETRON HCL INJECTION 0.25 MG/5ML
0.2500 mg | Freq: Once | INTRAVENOUS | Status: AC
  Administered 2018-08-26: 0.25 mg via INTRAVENOUS

## 2018-08-26 MED ORDER — PALONOSETRON HCL INJECTION 0.25 MG/5ML
INTRAVENOUS | Status: AC
  Filled 2018-08-26: qty 5

## 2018-08-26 MED ORDER — DIPHENHYDRAMINE HCL 25 MG PO CAPS
50.0000 mg | ORAL_CAPSULE | Freq: Once | ORAL | Status: AC
  Administered 2018-08-26: 50 mg via ORAL

## 2018-08-26 MED ORDER — SODIUM CHLORIDE 0.9 % IV SOLN
800.0000 mg/m2 | Freq: Once | INTRAVENOUS | Status: AC
  Administered 2018-08-26: 1254 mg via INTRAVENOUS
  Filled 2018-08-26: qty 32.98

## 2018-08-26 NOTE — Patient Instructions (Signed)
[place] [facility] Discharge Instructions for Patients Receiving Chemotherapy  Today you received the following chemotherapy agents Gemzar.  To help prevent nausea and vomiting after your treatment, we encourage you to take your nausea medication. DO NOT TAKE zOFRAN FOR THREE DAYS AFTER TREATMENT.   If you develop nausea and vomiting that is not controlled by your nausea medication, call the clinic.   BELOW ARE SYMPTOMS THAT SHOULD BE REPORTED IMMEDIATELY:  *FEVER GREATER THAN 100.5 F  *CHILLS WITH OR WITHOUT FEVER  NAUSEA AND VOMITING THAT IS NOT CONTROLLED WITH YOUR NAUSEA MEDICATION  *UNUSUAL SHORTNESS OF BREATH  *UNUSUAL BRUISING OR BLEEDING  TENDERNESS IN MOUTH AND THROAT WITH OR WITHOUT PRESENCE OF ULCERS  *URINARY PROBLEMS  *BOWEL PROBLEMS  UNUSUAL RASH Items with * indicate a potential emergency and should be followed up as soon as possible.  Feel free to call the clinic should you have any questions or concerns. The clinic phone number is [phone].  Please show the [place] at check-in to the Emergency Department and triage nurse.

## 2018-08-28 ENCOUNTER — Inpatient Hospital Stay: Payer: BC Managed Care – PPO

## 2018-08-28 VITALS — BP 126/75 | HR 59 | Temp 98.6°F | Resp 18

## 2018-08-28 DIAGNOSIS — C50512 Malignant neoplasm of lower-outer quadrant of left female breast: Secondary | ICD-10-CM

## 2018-08-28 DIAGNOSIS — Z171 Estrogen receptor negative status [ER-]: Principal | ICD-10-CM

## 2018-08-28 MED ORDER — PEGFILGRASTIM-CBQV 6 MG/0.6ML ~~LOC~~ SOSY
PREFILLED_SYRINGE | SUBCUTANEOUS | Status: AC
  Filled 2018-08-28: qty 0.6

## 2018-08-28 MED ORDER — PEGFILGRASTIM-CBQV 6 MG/0.6ML ~~LOC~~ SOSY
6.0000 mg | PREFILLED_SYRINGE | Freq: Once | SUBCUTANEOUS | Status: AC
  Administered 2018-08-28: 6 mg via SUBCUTANEOUS

## 2018-08-31 NOTE — Progress Notes (Signed)
Location of Breast Cancer: LEFT breast  Histology per Pathology Report: Biopsy of this left breast mass 03/17/2018 showed ([id number]), invasive ductal carcinoma, grade 2, estrogen and progesterone receptor negative, with an MIB-1 of 80%, but HER-2 amplified with a signals ratio 5.51 and the number per cell 17.35.  Receptor Status: ER(negative), PR (negative), Her2-neu (positive))  Did patient present with symptoms (if so, please note symptoms) or was this found on screening mammography?: The patient noted a palpable mass in her left breast as she was laying down. She brought this to medical attention immediately and had left diagnostic mammography with tomography at breast center  03/11/2018 showing a 2.5 cm palpable mass in the lower outer quadrant of the left breast, with one slightly prominent lymph node that had increased cortical measurement.  Past/Anticipated interventions by surgeon, if any: She underwent left mastectomy and sentinel lymph node sampling, as well as right-sided port placement 04/02/2018 under Etheleen Mayhew at [facility] for a 2.6 cm invasive ductal carcinoma, grade 3, with 1 of 3 sentinel nodes showing a micrometastatic deposit, but all margins negative.  She had a port placed at the same time.  Past/Anticipated interventions by medical oncology, if any: Chemotherapy Per Dr. Jana Hakim:  (2) adjuvant chemotherapy consisting of carboplatin and docetaxel given every 21 days x 6, started 05/13/2018, gemcitabine substituted for Taxotere with cycle 6 due to neuropathy  (3) trastuzumab and Pertuzumab will started 05/13/2018 with chemotherapy and continue for 6 months             (a) echocardiogram 05/06/2018 shows an ejection fraction in the 65-70%             (b) echocardiogram in 08/12/2018 shows EF of 65-70%  Lymphedema issues, if any:  No  Pain issues, if any:  No  SAFETY ISSUES:  Prior radiation? No  Pacemaker/ICD? No  Possible current pregnancy? N/A, pt is  post-surgical  Is the patient on methotrexate? No  Current Complaints / other details:  Pt presents today for initial consult with Dr. Sondra Come for Radiation Oncology. Pt is accompanied by husband. Pt is anxious about radiation and how many treatments she will have.     Loma Sousa, RN 09/06/2018,2:03 PM

## 2018-09-06 ENCOUNTER — Ambulatory Visit
Admission: RE | Admit: 2018-09-06 | Discharge: 2018-09-06 | Disposition: A | Discharge status: Home / Self Care | Payer: BC Managed Care – PPO | Source: Ambulatory Visit | Attending: Radiation Oncology | Admitting: Radiation Oncology

## 2018-09-06 ENCOUNTER — Other Ambulatory Visit: Payer: Self-pay

## 2018-09-06 ENCOUNTER — Encounter: Payer: Self-pay | Admitting: Radiation Oncology

## 2018-09-06 VITALS — BP 127/72 | HR 77 | Temp 98.3°F | Resp 20 | Ht 61.0 in | Wt 133.4 lb

## 2018-09-06 DIAGNOSIS — Z171 Estrogen receptor negative status [ER-]: Secondary | ICD-10-CM | POA: Insufficient documentation

## 2018-09-06 DIAGNOSIS — Z9012 Acquired absence of left breast and nipple: Secondary | ICD-10-CM | POA: Diagnosis not present

## 2018-09-06 DIAGNOSIS — Z801 Family history of malignant neoplasm of trachea, bronchus and lung: Secondary | ICD-10-CM | POA: Insufficient documentation

## 2018-09-06 DIAGNOSIS — C50512 Malignant neoplasm of lower-outer quadrant of left female breast: Secondary | ICD-10-CM

## 2018-09-06 DIAGNOSIS — Z803 Family history of malignant neoplasm of breast: Secondary | ICD-10-CM | POA: Diagnosis not present

## 2018-09-06 DIAGNOSIS — Z85828 Personal history of other malignant neoplasm of skin: Secondary | ICD-10-CM | POA: Diagnosis not present

## 2018-09-06 DIAGNOSIS — K219 Gastro-esophageal reflux disease without esophagitis: Secondary | ICD-10-CM | POA: Diagnosis not present

## 2018-09-06 DIAGNOSIS — Z9221 Personal history of antineoplastic chemotherapy: Secondary | ICD-10-CM | POA: Diagnosis not present

## 2018-09-06 DIAGNOSIS — Z79899 Other long term (current) drug therapy: Secondary | ICD-10-CM | POA: Insufficient documentation

## 2018-09-06 DIAGNOSIS — Z800 Family history of malignant neoplasm of digestive organs: Secondary | ICD-10-CM | POA: Insufficient documentation

## 2018-09-06 NOTE — Progress Notes (Signed)
Radiation Oncology         [phone] ________________________________  Multidisciplinary Breast Oncology Clinic [facility]) Initial Outpatient Consultation  Name: Monica Padilla MRN: [id number]  Date: 09/06/2018  DOB: 06-18-1966  WF:UXNAT, Jaymes Graff, MD  Magrinat, Virgie Dad, MD   REFERRING PHYSICIAN: Magrinat, Virgie Dad, MD  DIAGNOSIS: The encounter diagnosis was Malignant neoplasm of lower-outer quadrant of left breast of female, estrogen receptor negative ([place]). Pathologic stage: pT2, pN1a (sn), Left Breast, LOQ, Invasive Ductal Carcinoma, ER (-), PR (-), HER2 (+), grade 3  HISTORY OF PRESENT ILLNESS::Monica Padilla is a 52 y.o. female who is presenting to the office today for evaluation of her newly diagnosed breast cancer. She is accompanied by her husband, Monica Padilla today. She completed chemotherapy on 08/26/2018 and she notes that she did well during treatment. She was unable to take perjeta due to a rash, however, she is able to take Herceptin at this time and will continue taking it until next year. She wasn't hospitalized for her symptoms. She denies any issues since her surgery and she had her most recent appointment in July with her next appointment being next year with Dr. Noberto Retort. She hasn't been evaluated by a plastic surgeon in the past. 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 She has a hx of basal cell carcinoma that was removed with an in office procedure at her Dermatologists office. She didn't have physical therapy since her surgery, however, she completed home exercises to aid with ROM.   She had a diagnostic unilateral left mammogram on 03/11/2018 showed: There is a 2.5 cm highly suspicious mass at the palpable site in the lower outer left breast. There is 1 slightly prominent lymph node, however, the cortical measurement is up to 3 mm which is within normal limits for size.   Accordingly on 03/17/2018 she proceeded to biopsy of the left breast  area in question. The pathology from this procedure showed: Breast, left, needle core biopsy, 4 o'clock, heart clip with invasive ductal carcinoma, MSBR Grade II/III. Prognostic indicators significant for: estrogen receptor, 0% negative and progesterone receptor, 0% negative. Proliferation marker Ki67 at 80%. HER2 positive.  On 04/02/2018, she had a left mastectomy performed with results showing: Breast, simple mastectomy, left with invasive ductal carcinoma, grade 3, and high grade carcinoma in situ, 26 mm mass. Fibrocystic changes. Margins free of neoplasm. Lymph node, sentinel biopsy, left axillary #1-hot and blue with one lymph node positive for metastatic carcinoma out of a total of one submitted (1/1). Metastatic deposit measures 6 mm. Lymph node, sentinel, biopsy, left axillary #2- hot and blue with one lymph node negative for metastatic carcinoma out of a total of one submitted (0/1). Lymph node, sentinel, biopsy, left axillary #3-hot and blue with one lymph node negative for metastatic carcinoma out of a total of one submitted (0/1).   She had genetic testing on 06/24/2018 that showed: Variant of Uncertain Significance identified in [facility].  Most recent echocardiogram on 08/12/2018 showed: LV EF: 65% -   70%.   On review of systems, She reports numbness to the surgical site. She denies numbness/weakness to her left arm, and any other symptoms. She notes that 10 years ago she had a biopsy to her left breast that was benign at the time.   PREVIOUS RADIATION THERAPY: No  PAST MEDICAL HISTORY:  has a past medical history of Breast cancer ([place]), Cancer ([place]), Family history of breast cancer, Family history of colon cancer, Family  history of lung cancer, and GERD (gastroesophageal reflux disease).    PAST SURGICAL HISTORY: Past Surgical History:  Procedure Laterality Date  . PARTIAL HYSTERECTOMY    . SKIN CANCER EXCISION      FAMILY HISTORY: family history includes Basal cell carcinoma in her  sister; Cancer in her paternal grandfather; Colon cancer (age of onset: 76) in her father; Heart disease in her maternal grandfather and maternal grandmother; Lung cancer in her maternal aunt and paternal uncle.  SOCIAL HISTORY:  reports that she has never smoked. She has never used smokeless tobacco. She reports that she drinks alcohol. She reports that she does not use drugs.  ALLERGIES: Codeine; Nitrofurantoin; and Amoxicillin  MEDICATIONS:  Current Outpatient Medications  Medication Sig Dispense Refill  . lidocaine-prilocaine (EMLA) cream Apply to affected area once 30 g 3  . LORazepam (ATIVAN) 0.5 MG tablet TAKE ONE TABLET DAILY AT BEDTIME AS NEEDED FOR ANXIETY 30 tablet 1  . Multiple Vitamins-Minerals (WOMENS MULTI VITAMIN & MINERAL) TABS Take 1 each by mouth.    . pantoprazole (PROTONIX) 40 MG tablet Take by mouth.    . Ascorbic Acid (VITAMIN C PO) Take 500 mg by mouth daily. With rose hips    . Cholecalciferol (VITAMIN D-1000 MAX ST) 1000 units tablet Take by mouth.    . CRANBERRY PO Take by mouth daily.    Marland Kitchen dexamethasone (DECADRON) 4 MG tablet START DAY BEFORE TAXOTERE. TAKE 2 TABS BY MOUTH TWICE DAILY, TAKE ONETHE DAY AFTER THEN TWICE DAILY FOR 2 MORE DAYS (Patient not taking: Reported on 09/06/2018) 30 tablet 1  . doxycycline (VIBRA-TABS) 100 MG tablet Take 1 tablet (100 mg total) by mouth daily. (Patient not taking: Reported on 09/06/2018) 20 tablet 1  . doxycycline (VIBRAMYCIN) 100 MG capsule Take 1 capsule (100 mg total) by mouth 2 (two) times daily. (Patient not taking: Reported on 09/06/2018) 20 capsule 0  . gabapentin (NEURONTIN) 300 MG capsule Take 1 capsule (300 mg total) by mouth at bedtime. (Patient not taking: Reported on 08/26/2018) 90 capsule 4  . methylPREDNISolone (MEDROL DOSEPAK) 4 MG TBPK tablet 6 x 1 day, 5 x 1 day, 4 x 1 day, 3 x 1 day, 2 x 1 day, 1 x 1 day (Patient not taking: Reported on 09/06/2018) 21 tablet 0  . prochlorperazine (COMPAZINE) 10 MG tablet Take 1 tablet  (10 mg total) by mouth every 6 (six) hours as needed (Nausea or vomiting). (Patient not taking: Reported on 09/06/2018) 30 tablet 1   No current facility-administered medications for this encounter.     REVIEW OF SYSTEMS:  REVIEW OF SYSTEMS: A 10+ POINT REVIEW OF SYSTEMS WAS OBTAINED including neurology, dermatology, psychiatry, cardiac, respiratory, lymph, extremities, GI, GU, musculoskeletal, constitutional, reproductive, HEENT. All pertinent positives are noted in the HPI. All others are negative.   PHYSICAL EXAM:  height is '5\' 1"'  (1.549 m) and weight is 133 lb 6.4 oz (60.5 kg). Her oral temperature is 98.3 F (36.8 C). Her blood pressure is 127/72 and her pulse is 77. Her respiration is 20 and oxygen saturation is 100%.   General: Alert and oriented, in no acute distress HEENT: Head is normocephalic. Extraocular movements are intact. Oropharynx is clear. Neck: Neck is supple, no palpable cervical or supraclavicular lymphadenopathy. Heart: Regular in rate and rhythm with no murmurs, rubs, or gallops. Chest: Clear to auscultation bilaterally, with no rhonchi, wheezes, or rales. Abdomen: Soft, nontender, nondistended, with no rigidity or guarding. Extremities: No cyanosis or edema. Lymphatics: see Neck Exam  Skin: No concerning lesions. Musculoskeletal: symmetric strength and muscle tone throughout. Neurologic: Cranial nerves II through XII are grossly intact. No obvious focalities. Speech is fluent. Coordination is intact. Psychiatric: Judgment and insight are intact. Affect is appropriate. Breast: Right breast with no palpable mass, nipple discharge, or bleeding. Left chest wall with mastectomy scar which is well healed. No palpable or visible signs of recurrence.     ECOG: 1  LABORATORY DATA:  Lab Results  Component Value Date   WBC 7.1 08/26/2018   HGB 10.4 (L) 08/26/2018   HCT 31.2 (L) 08/26/2018   MCV 96.4 08/26/2018   PLT 218 08/26/2018   Lab Results  Component Value Date    NA 141 08/26/2018   K 4.1 08/26/2018   CL 108 08/26/2018   CO2 24 08/26/2018   Lab Results  Component Value Date   ALT 49 (H) 08/26/2018   AST 28 08/26/2018   ALKPHOS 104 08/26/2018   BILITOT 0.3 08/26/2018    RADIOGRAPHY: No results found.    IMPRESSION: Pathologic stage: pT2, pN1a (sn), Left Breast, LOQ, Invasive Ductal Carcinoma, ER (-), PR (-), HER2 (+), grade 3  Given her stage/lymph involvement, the patient would be at risk for local regional recurrence and I would recommend post mastectomy radiation therapy in this situation. Patient would have coverage of the axillary region, since she had a positive lymph node on sentinel node biopsy but didn't have a completion axillary node dissection.   Today, I talked to the patient and husband about the findings and work-up thus far.  We discussed the natural history of breast cancer and general treatment, highlighting the role of radiotherapy in the management.  We discussed the available radiation techniques, and focused on the details of logistics and delivery.  We reviewed the anticipated acute and late sequelae associated with radiation in this setting.  The patient was encouraged to ask questions that I answered to the best of my ability.    PLAN: CT simulation on 09/08/2018 at 10 AM. Treatments to begin approximately 1 week later. Pt will receive between 6-6.5 weeks of radiation therapy. Will use cardiac sparring techniques if neccessary.     Blair Promise, PhD, MD   This document serves as a record of services personally performed by Gery Pray, MD. It was created on his behalf by Steva Colder, a trained medical scribe. The creation of this record is based on the scribe's personal observations and the provider's statements to them. This document has been checked and approved by the attending provider.

## 2018-09-07 ENCOUNTER — Ambulatory Visit: Payer: BC Managed Care – PPO | Admitting: Radiation Oncology

## 2018-09-08 ENCOUNTER — Ambulatory Visit
Admission: RE | Admit: 2018-09-08 | Discharge: 2018-09-08 | Disposition: A | Discharge status: Home / Self Care | Payer: BC Managed Care – PPO | Source: Ambulatory Visit | Attending: Radiation Oncology | Admitting: Radiation Oncology

## 2018-09-08 ENCOUNTER — Other Ambulatory Visit: Payer: Self-pay | Admitting: Oncology

## 2018-09-08 DIAGNOSIS — Z171 Estrogen receptor negative status [ER-]: Secondary | ICD-10-CM | POA: Insufficient documentation

## 2018-09-08 DIAGNOSIS — C50512 Malignant neoplasm of lower-outer quadrant of left female breast: Secondary | ICD-10-CM | POA: Insufficient documentation

## 2018-09-08 DIAGNOSIS — Z510 Encounter for antineoplastic radiation therapy: Secondary | ICD-10-CM | POA: Insufficient documentation

## 2018-09-08 NOTE — Progress Notes (Signed)
Radiation Oncology         [phone] ________________________________  Name: Monica Padilla MRN: [id number]  Date: 09/08/2018  DOB: Jul 29, 1966  SIMULATION AND TREATMENT PLANNING NOTE   DIAGNOSIS: The encounter diagnosis was Malignant neoplasm of lower-outer quadrant of left breast of female, estrogen receptor negative ([place]). Pathologic stage: pT2, pN1a (sn), Left Breast, LOQ, Invasive Ductal Carcinoma, ER (-), PR (-), HER2 (+), grade 3  NARRATIVE:  The patient was brought to the [place].  Identity was confirmed.  All relevant records and images related to the planned course of therapy were reviewed.  The patient freely provided informed written consent to proceed with treatment after reviewing the details related to the planned course of therapy. The consent form was witnessed and verified by the simulation staff.  Then, the patient was set-up in a stable reproducible  supine position for radiation therapy.  CT images were obtained.  Surface markings were placed.  The CT images were loaded into the planning software.  Then the target and avoidance structures were contoured.  Treatment planning then occurred.  The radiation prescription was entered and confirmed.  Then, I designed and supervised the construction of a total of 5 medically necessary complex treatment devices.  I have requested : 3D Simulation  I have requested a DVH of the following structures: heart lungs, chest wall volume.  I have ordered:dose calc.  PLAN:  The patient will receive 50.4 Gy in 28 fractions directed at the left chest wall area. The left axillary/supraclavicular region will receive 45 gray in 25 fractions. The mastectomy scar area will receive 10 gray in 5 fractions.    Optical Surface Tracking Plan:  Since intensity modulated radiotherapy (IMRT) and 3D conformal radiation treatment methods are predicated on accurate and precise positioning for treatment, intrafraction motion monitoring is  medically necessary to ensure accurate and safe treatment delivery.  The ability to quantify intrafraction motion without excessive ionizing radiation dose can only be performed with optical surface tracking. Accordingly, surface imaging offers the opportunity to obtain 3D measurements of patient position throughout IMRT and 3D treatments without excessive radiation exposure.  I am ordering optical surface tracking for this patient's upcoming course of radiotherapy. ________________________________  Special treatment procedure was performed today due to the extra time and effort required by myself to plan and prepare this patient for deep inspiration breath hold technique.  I have determined cardiac sparing to be of benefit to this patient to prevent long term cardiac damage due to radiation of the heart.  Bellows were placed on the patient's abdomen. To facilitate cardiac sparing, the patient was coached by the radiation therapists on breath hold techniques and breathing practice was performed. Practice waveforms were obtained. The patient was then scanned while maintaining breath hold in the treatment position.  This image was then transferred over to the imaging specialist. The imaging specialist then created a fusion of the free breathing and breath hold scans using the chest wall as the stable structure. I personally reviewed the fusion in axial, coronal and sagittal image planes.  Excellent cardiac sparing was obtained.  I felt the patient is an appropriate candidate for breath hold and the patient will be treated as such.  The image fusion was then reviewed with the patient to reinforce the necessity of reproducible breath hold.   -----------------------------------  Blair Promise, PhD, MD  This document serves as a record of services personally performed by Gery Pray, MD. It was created on his  behalf by Steva Colder, a trained medical scribe. The creation of this record is based on the  scribe's personal observations and the provider's statements to them. This document has been checked and approved by the attending provider.

## 2018-09-14 DIAGNOSIS — C50512 Malignant neoplasm of lower-outer quadrant of left female breast: Secondary | ICD-10-CM | POA: Diagnosis not present

## 2018-09-15 ENCOUNTER — Ambulatory Visit
Admission: RE | Admit: 2018-09-15 | Discharge: 2018-09-15 | Disposition: A | Discharge status: Home / Self Care | Payer: BC Managed Care – PPO | Source: Ambulatory Visit | Attending: Radiation Oncology | Admitting: Radiation Oncology

## 2018-09-15 DIAGNOSIS — C50512 Malignant neoplasm of lower-outer quadrant of left female breast: Secondary | ICD-10-CM | POA: Diagnosis not present

## 2018-09-15 DIAGNOSIS — Z171 Estrogen receptor negative status [ER-]: Principal | ICD-10-CM

## 2018-09-15 NOTE — Progress Notes (Signed)
  Radiation Oncology         [phone]) [phone] ________________________________  Name: Monica Padilla MRN: [id number]  Date: 09/15/2018  DOB: Jun 05, 1966  Simulation Verification Note    ICD-10-CM   1. Malignant neoplasm of lower-outer quadrant of left breast of female, estrogen receptor negative ([place]) C50.512    Z17.1     Status: outpatient  NARRATIVE: The patient was brought to the treatment unit and placed in the planned treatment position. The clinical setup was verified. Then port films were obtained and uploaded to the radiation oncology medical record software.  The treatment beams were carefully compared against the planned radiation fields. The position location and shape of the radiation fields was reviewed. They targeted volume of tissue appears to be appropriately covered by the radiation beams. Organs at risk appear to be excluded as planned.  Based on my personal review, I approved the simulation verification. The patient's treatment will proceed as planned.  -----------------------------------  Blair Promise, PhD, MD  This document serves as a record of services personally performed by Gery Pray, MD. It was created on his behalf by Wilburn Mylar, a trained medical scribe. The creation of this record is based on the scribe's personal observations and the provider's statements to them. This document has been checked and approved by the attending provider.

## 2018-09-16 ENCOUNTER — Inpatient Hospital Stay: Payer: BC Managed Care – PPO | Attending: Oncology

## 2018-09-16 ENCOUNTER — Telehealth: Payer: Self-pay | Admitting: Adult Health

## 2018-09-16 ENCOUNTER — Ambulatory Visit
Admission: RE | Admit: 2018-09-16 | Discharge: 2018-09-16 | Disposition: A | Discharge status: Home / Self Care | Payer: BC Managed Care – PPO | Source: Ambulatory Visit | Attending: Radiation Oncology | Admitting: Radiation Oncology

## 2018-09-16 ENCOUNTER — Ambulatory Visit: Payer: BC Managed Care – PPO

## 2018-09-16 ENCOUNTER — Inpatient Hospital Stay: Payer: BC Managed Care – PPO

## 2018-09-16 ENCOUNTER — Other Ambulatory Visit: Payer: BC Managed Care – PPO

## 2018-09-16 ENCOUNTER — Encounter: Payer: Self-pay | Admitting: Adult Health

## 2018-09-16 ENCOUNTER — Inpatient Hospital Stay (HOSPITAL_BASED_OUTPATIENT_CLINIC_OR_DEPARTMENT_OTHER): Payer: BC Managed Care – PPO | Admitting: Adult Health

## 2018-09-16 VITALS — BP 138/69 | HR 63 | Temp 98.0°F | Resp 18 | Ht 61.0 in | Wt 132.6 lb

## 2018-09-16 DIAGNOSIS — Z171 Estrogen receptor negative status [ER-]: Secondary | ICD-10-CM

## 2018-09-16 DIAGNOSIS — H5789 Other specified disorders of eye and adnexa: Secondary | ICD-10-CM | POA: Diagnosis not present

## 2018-09-16 DIAGNOSIS — C50512 Malignant neoplasm of lower-outer quadrant of left female breast: Secondary | ICD-10-CM | POA: Insufficient documentation

## 2018-09-16 DIAGNOSIS — M7989 Other specified soft tissue disorders: Secondary | ICD-10-CM

## 2018-09-16 DIAGNOSIS — Z95828 Presence of other vascular implants and grafts: Secondary | ICD-10-CM

## 2018-09-16 DIAGNOSIS — Z5112 Encounter for antineoplastic immunotherapy: Secondary | ICD-10-CM | POA: Diagnosis present

## 2018-09-16 DIAGNOSIS — Z13220 Encounter for screening for lipoid disorders: Secondary | ICD-10-CM

## 2018-09-16 DIAGNOSIS — Z9221 Personal history of antineoplastic chemotherapy: Secondary | ICD-10-CM

## 2018-09-16 DIAGNOSIS — Z9012 Acquired absence of left breast and nipple: Secondary | ICD-10-CM

## 2018-09-16 LAB — COMPREHENSIVE METABOLIC PANEL
ALK PHOS: 103 U/L (ref 38–126)
ALT: 41 U/L (ref 0–44)
ANION GAP: 8 (ref 5–15)
AST: 33 U/L (ref 15–41)
Albumin: 3.8 g/dL (ref 3.5–5.0)
BILIRUBIN TOTAL: 0.3 mg/dL (ref 0.3–1.2)
BUN: 8 mg/dL (ref 6–20)
CALCIUM: 9.7 mg/dL (ref 8.9–10.3)
CO2: 27 mmol/L (ref 22–32)
Chloride: 108 mmol/L (ref 98–111)
Creatinine, Ser: 0.68 mg/dL (ref 0.44–1.00)
GLUCOSE: 84 mg/dL (ref 70–99)
Potassium: 4 mmol/L (ref 3.5–5.1)
Sodium: 143 mmol/L (ref 135–145)
TOTAL PROTEIN: 6.3 g/dL — AB (ref 6.5–8.1)

## 2018-09-16 LAB — CBC WITH DIFFERENTIAL/PLATELET
Basophils Absolute: 0 10*3/uL (ref 0.0–0.1)
Basophils Relative: 1 %
Eosinophils Absolute: 0 10*3/uL (ref 0.0–0.5)
Eosinophils Relative: 2 %
HEMATOCRIT: 32.5 % — AB (ref 34.8–46.6)
HEMOGLOBIN: 11 g/dL — AB (ref 11.6–15.9)
Lymphocytes Relative: 38 %
Lymphs Abs: 1.2 10*3/uL (ref 0.9–3.3)
MCH: 32.9 pg (ref 25.1–34.0)
MCHC: 33.8 g/dL (ref 31.5–36.0)
MCV: 97.5 fL (ref 79.5–101.0)
MONOS PCT: 13 %
Monocytes Absolute: 0.4 10*3/uL (ref 0.1–0.9)
NEUTROS ABS: 1.5 10*3/uL (ref 1.5–6.5)
NEUTROS PCT: 46 %
Platelets: 233 10*3/uL (ref 145–400)
RBC: 3.33 MIL/uL — ABNORMAL LOW (ref 3.70–5.45)
RDW: 15.4 % — ABNORMAL HIGH (ref 11.2–14.5)
WBC: 3.1 10*3/uL — ABNORMAL LOW (ref 3.9–10.3)

## 2018-09-16 MED ORDER — DIPHENHYDRAMINE HCL 25 MG PO CAPS
ORAL_CAPSULE | ORAL | Status: AC
  Filled 2018-09-16: qty 2

## 2018-09-16 MED ORDER — SODIUM CHLORIDE 0.9 % IV SOLN
Freq: Once | INTRAVENOUS | Status: AC
  Administered 2018-09-16: 13:00:00 via INTRAVENOUS
  Filled 2018-09-16: qty 250

## 2018-09-16 MED ORDER — HEPARIN SOD (PORK) LOCK FLUSH 100 UNIT/ML IV SOLN
500.0000 [IU] | Freq: Once | INTRAVENOUS | Status: AC | PRN
  Administered 2018-09-16: 500 [IU]
  Filled 2018-09-16: qty 5

## 2018-09-16 MED ORDER — ACETAMINOPHEN 325 MG PO TABS
650.0000 mg | ORAL_TABLET | Freq: Once | ORAL | Status: AC
  Administered 2018-09-16: 650 mg via ORAL

## 2018-09-16 MED ORDER — SODIUM CHLORIDE 0.9% FLUSH
10.0000 mL | INTRAVENOUS | Status: DC | PRN
  Administered 2018-09-16: 10 mL
  Filled 2018-09-16: qty 10

## 2018-09-16 MED ORDER — TRASTUZUMAB CHEMO 150 MG IV SOLR
6.0000 mg/kg | Freq: Once | INTRAVENOUS | Status: AC
  Administered 2018-09-16: 336 mg via INTRAVENOUS
  Filled 2018-09-16: qty 16

## 2018-09-16 MED ORDER — ACETAMINOPHEN 325 MG PO TABS
ORAL_TABLET | ORAL | Status: AC
  Filled 2018-09-16: qty 2

## 2018-09-16 MED ORDER — DIPHENHYDRAMINE HCL 25 MG PO CAPS
50.0000 mg | ORAL_CAPSULE | Freq: Once | ORAL | Status: AC
  Administered 2018-09-16: 50 mg via ORAL

## 2018-09-16 NOTE — Telephone Encounter (Signed)
Per 9/19 los, no new orders.

## 2018-09-16 NOTE — Progress Notes (Signed)
[facility]  [phone] [phone]     ID: Monica Padilla DOB: 27-Apr-1966  MR#: [id number]  YYQ#:[id number]  Patient Care Team: Monica Greenhouse, MD as PCP - General (Family Medicine) Monica Colt, MD as Referring Physician (Obstetrics and Gynecology) Monica Padilla, Monica Dad, MD as Consulting Physician (Oncology) Monica Dresser, MD as Consulting Physician (Cardiology) Monica Retort Juanda Bond., MD as Referring Physician (Surgery) Monica Mayer, MD as Consulting Physician (Gastroenterology) Monica Nations, MD as Consulting Physician (Diagnostic Radiology) OTHER MD: Dr. Jimmye Padilla and Dr. 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  She is due for Trastuzumab today.  She is feeling well.  She does note some mild swelling in her feet at night.  She is otherwise doing well and is without questions or concerns today.    REVIEW OF SYSTEMS: Monica Padilla is going to start with radiation today.  She is prepared.  She denies any unusual headaches, vision changes, appetite loss.  She is without nausea, vomiting, bowel/bladder changes, chest pains or palpitations, cough or shortness of breath.  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  She did have some eye tearing which has improved since using systane eye gtts.  She is otherwise doing well and a detailed ROS was non contributory today.    HISTORY OF CURRENT ILLNESS: From the original intake note:  The patient noted a palpable mass in her left breast as she was laying down. She brought this to medical attention immediately and had left diagnostic mammography with tomography at breast center  03/11/2018 showing a 2.5  cm palpable mass in the lower outer quadrant of the left breast, with one slightly prominent lymph node that had increased cortical measurement.  Biopsy of this left breast mass 03/17/2018 showed ([id number]), invasive ductal carcinoma, grade 2, estrogen and progesterone receptor negative, with an MIB-1 of 80%, but HER-2 amplified with a signals ratio 5.51 and the number per cell 17.35.  She underwent left mastectomy and sentinel lymph node sampling, as well as right-sided port placement 04/02/2018 under Monica Padilla at [facility] for a 2.6 cm invasive ductal carcinoma, grade 3, with 1 of 3 sentinel nodes showing a micrometastatic deposit, but all margins negative.  She had a port placed at the same time.  The patient's subsequent history is as detailed below.    PAST MEDICAL HISTORY: Past Medical History:  Diagnosis Date  . Breast cancer ([facility])   . Cancer ([facility])    basal cell CA- chest, leg  . Family history of breast cancer   . Family history of colon cancer   . Family history of lung cancer   . GERD (gastroesophageal reflux disease)   s.   PAST SURGICAL HISTORY: Past Surgical History:  Procedure Laterality Date  . PARTIAL HYSTERECTOMY    . SKIN CANCER EXCISION      FAMILY HISTORY Family History  Problem Relation Age of Onset  . Colon cancer Father 60  . Basal cell carcinoma Sister        2-3  . Lung cancer Maternal Aunt        hx smoking  . Lung cancer Paternal Uncle        hx smoking  . Heart disease Maternal Grandmother   .  Heart disease Maternal Grandfather   . Cancer Paternal Grandfather        unk if colon or stomach? died in early 84's.   . Esophageal cancer Neg Hx   . Stomach cancer Neg Hx   . Rectal cancer Neg Hx    The patient's father died due to colon cancer at age 56. The patient's mother is alive at age 63. The patient has 1 sister and no brothers. A paternal cousin was diagnosed with breast cancer in the early 4's (before 63). The patient's paternal  grandfather passed away from colon cancer. The patient's father had 3 brothers, one of whom had lung cancer.   GYNECOLOGIC HISTORY:  No LMP recorded. Patient has had a hysterectomy. Menarche: 52 years old Age at first live birth: 52 years old She is GXP2.  The patient is status post partial hysterectomy due to uterine fibroids. She retains her cervix and ovaries.  After her hysterectomy she used Estradiol with no complications.  She stopped hormone replacement March 2019.  She is now having hot flashes and nigh sweats that only allow her to sleep 3 hours at a time.   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 Her husband, Monica Padilla, is a Hydrologist for alarms and cameras for [employer]. The patient's oldest son, Monica Padilla, is 96 and works as a Civil engineer, contracting in [place]. The patient's son, Monica Padilla, is 76 an works for a Dispensing optician. Monica Padilla lives at home with the patient. Monica Padilla does not have any grandchildren. She attends [employer].     ADVANCED DIRECTIVES:    HEALTH MAINTENANCE: Social History   Tobacco Use  . Smoking status: Never Smoker  . Smokeless tobacco: Never Used  Substance Use Topics  . Alcohol use: Yes    Comment: occasional  . Drug use: No     Colonoscopy: 10/04/2013/ Dr. Carlean Padilla / polyp removal  PAP:  Bone density: remote   Allergies  Allergen Reactions  . Codeine Itching  . Nitrofurantoin     REACTION: pruritis  . Amoxicillin Rash    Current Outpatient Medications  Medication Sig Dispense Refill  . lidocaine-prilocaine (EMLA) cream Apply to affected area once 30 g 3  . LORazepam (ATIVAN) 0.5 MG tablet TAKE ONE TABLET DAILY AT BEDTIME AS NEEDED FOR ANXIETY 30 tablet 1  . Multiple Vitamins-Minerals (WOMENS MULTI VITAMIN & MINERAL) TABS Take 1 each by mouth.    . pantoprazole (PROTONIX) 40 MG tablet Take by mouth.     No current facility-administered medications for this visit.      OBJECTIVE: Vitals:   09/16/18 1008  BP: 138/69  Pulse: 63  Resp: 18  Temp: 98 F (36.7 C)  SpO2: 100%     Body mass index is 25.05 kg/m.   Wt Readings from Last 3 Encounters:  09/16/18 132 lb 9.6 oz (60.1 kg)  09/06/18 133 lb 6.4 oz (60.5 kg)  08/26/18 131 lb 11.2 oz (59.7 kg)  ECOG FS:1 GENERAL: Patient is a well appearing female in no acute distress HEENT:  Sclerae anicteric.  Oropharynx clear and moist. No ulcerations or evidence of oropharyngeal candidiasis. Neck is supple.  NODES:  No cervical, supraclavicular, or axillary lymphadenopathy palpated.  BREAST EXAM: left breast s/p mastecomy, no concerns for local recurrence, right breast without nodules, masses, skin or nipple changes LUNGS:  Clear to auscultation bilaterally.  No wheezes or rhonchi. HEART:  Regular rate and rhythm. No murmur appreciated. ABDOMEN:  Soft,  nontender.  Positive, normoactive bowel sounds. No organomegaly palpated. MSK:  No focal spinal tenderness to palpation. Full range of motion bilaterally in the upper extremities. EXTREMITIES:  No peripheral edema.   SKIN:  Clear with no obvious rashes or skin changes. No nail dyscrasia. NEURO:  Nonfocal. Well oriented.  Appropriate affect.     LAB RESULTS:  CMP     Component Value Date/Time   NA 141 08/26/2018 0918   K 4.1 08/26/2018 0918   CL 108 08/26/2018 0918   CO2 24 08/26/2018 0918   GLUCOSE 139 (H) 08/26/2018 0918   BUN 14 08/26/2018 0918   CREATININE 0.71 08/26/2018 0918   CALCIUM 10.0 08/26/2018 0918   PROT 6.5 08/26/2018 0918   ALBUMIN 3.9 08/26/2018 0918   AST 28 08/26/2018 0918   ALT 49 (H) 08/26/2018 0918   ALKPHOS 104 08/26/2018 0918   BILITOT 0.3 08/26/2018 0918   GFRNONAA >60 08/26/2018 0918   GFRAA >60 08/26/2018 0918    No results found for: TOTALPROTELP, ALBUMINELP, A1GS, A2GS, BETS, BETA2SER, GAMS, MSPIKE, SPEI  No results found for: KPAFRELGTCHN, LAMBDASER, KAPLAMBRATIO  Lab Results  Component Value Date   WBC  3.1 (L) 09/16/2018   NEUTROABS 1.5 09/16/2018   HGB 11.0 (L) 09/16/2018   HCT 32.5 (L) 09/16/2018   MCV 97.5 09/16/2018   PLT 233 09/16/2018    '@LASTCHEMISTRY' @  No results found for: LABCA2  No components found for: [id number]  No results for input(s): INR in the last 168 hours.  No results found for: LABCA2  No results found for: WUJ811  No results found for: BJY782  No results found for: NFA213  No results found for: CA2729  No components found for: HGQUANT  No results found for: CEA1 / No results found for: CEA1   No results found for: AFPTUMOR  No results found for: CHROMOGRNA  No results found for: PSA1  Appointment on 09/16/2018  Component Date Value Ref Range Status  . WBC 09/16/2018 3.1* 3.9 - 10.3 K/uL Final  . RBC 09/16/2018 3.33* 3.70 - 5.45 MIL/uL Final  . Hemoglobin 09/16/2018 11.0* 11.6 - 15.9 g/dL Final  . HCT 09/16/2018 32.5* 34.8 - 46.6 % Final  . MCV 09/16/2018 97.5  79.5 - 101.0 fL Final  . MCH 09/16/2018 32.9  25.1 - 34.0 pg Final  . MCHC 09/16/2018 33.8  31.5 - 36.0 g/dL Final  . RDW 09/16/2018 15.4* 11.2 - 14.5 % Final  . Platelets 09/16/2018 233  145 - 400 K/uL Final  . Neutrophils Relative % 09/16/2018 46  % Final  . Neutro Abs 09/16/2018 1.5  1.5 - 6.5 K/uL Final  . Lymphocytes Relative 09/16/2018 38  % Final  . Lymphs Abs 09/16/2018 1.2  0.9 - 3.3 K/uL Final  . Monocytes Relative 09/16/2018 13  % Final  . Monocytes Absolute 09/16/2018 0.4  0.1 - 0.9 K/uL Final  . Eosinophils Relative 09/16/2018 2  % Final  . Eosinophils Absolute 09/16/2018 0.0  0.0 - 0.5 K/uL Final  . Basophils Relative 09/16/2018 1  % Final  . Basophils Absolute 09/16/2018 0.0  0.0 - 0.1 K/uL Final   Performed at [facility], [place] [address] [place], [place] [address]    (this displays the last labs from the last 3 days)  No results found for: TOTALPROTELP, ALBUMINELP, A1GS, A2GS, BETS, BETA2SER, GAMS, MSPIKE, SPEI (this displays SPEP  labs)  No results found for: KPAFRELGTCHN, LAMBDASER, KAPLAMBRATIO (kappa/lambda light chains)  No results found  for: HGBA, HGBA2QUANT, HGBFQUANT, HGBSQUAN (Hemoglobinopathy evaluation)   No results found for: LDH  No results found for: IRON, TIBC, IRONPCTSAT (Iron and TIBC)  No results found for: FERRITIN  Urinalysis No results found for: COLORURINE, APPEARANCEUR, LABSPEC, PHURINE, GLUCOSEU, HGBUR, BILIRUBINUR, KETONESUR, PROTEINUR, UROBILINOGEN, NITRITE, LEUKOCYTESUR   STUDIES: No results found.  ELIGIBLE FOR AVAILABLE RESEARCH PROTOCOL: not discussed  ASSESSMENT: 52 y.o. [place], [place] woman status post left mastectomy and sentinel lymph node sampling 04/02/2018 for a pT2 pN1, stage IIB invasive ductal carcinoma, grade 3, estrogen and progesterone receptor negative, but HER-2 amplified  (1) genetics testing 06/15/2018  (2) adjuvant chemotherapy consisting of carboplatin and docetaxel given every 21 days x 6, started 05/13/2018, gemcitabine substituted for Taxotere with cycle 6 due to neuropathy, completed on 08/26/2018  (3) trastuzumab and Pertuzumab will started 05/13/2018 with chemotherapy and continue for 6 months (Pertuzumab omitted after 2 cycles due to rash)  (a) echocardiogram 05/06/2018 shows an ejection fraction in the 65-70%  (b) echocardiogram in 08/12/2018 shows EF of 65-70%  (4) adjuvant radiation beginning 9/19  (5) referral to plastics placed 05/03/2018    PLAN: Monica Padilla is doing well today.  She continues to recover from her treatment.  She is going to start radiation today.  She will undergo Trastuzumab afterward as she is tolerating it well.  I reviewed her labs with her in detail which are much improved.    I sent a message to Kevan Rosebush RN, at the cardiology office to get her in with Dr. Aundra Dubin or Dr. Haroldine Laws.    Monica Padilla will continue on the systane eye gtts for her eys.  The watery eyes are improving.  We also talked about things to help alleviate  her lower extremity swelling such as compression hose, elevation of her legs, sodium limitation.  Should the swelling worsen we will consider adding HCTZ to her medication regimen.  She would like to avoid that at this point.    Monica Padilla did ask me to add a lipid panel to her next set of labs and she will come in fasting.  She is also going to call her PCP and get the name of any other lab tests they may want done at that time as well and I will have them drawn.  Monica Padilla understands that I will be forwarding her results to her PCP via fax for them to review, manage, and document.  Monica Padilla will return in 3 weeks for labs and f/u with Dr. Jana Hakim, along with her next Trastuzumab.    She has a good understanding of this plan.  She knows to call for any problems that may develop before the next visit.  A total of (30) minutes of face-to-face time was spent with this patient with greater than 50% of that time in counseling and care-coordination.   Wilber Bihari, NP 09/16/18 10:30 AM Medical Oncology and Hematology [facility] [address] [place], [place] [address] Tel. [phone]    Fax. [phone]

## 2018-09-16 NOTE — Patient Instructions (Signed)
[facility] Discharge Instructions for Patients Receiving Chemotherapy  Today you received the following chemotherapy agents: Herceptin.  To help prevent nausea and vomiting after your treatment, we encourage you to take your nausea medication. DO NOT TAKE zOFRAN FOR THREE DAYS AFTER TREATMENT.   If you develop nausea and vomiting that is not controlled by your nausea medication, call the clinic.   BELOW ARE SYMPTOMS THAT SHOULD BE REPORTED IMMEDIATELY:  *FEVER GREATER THAN 100.5 F  *CHILLS WITH OR WITHOUT FEVER  NAUSEA AND VOMITING THAT IS NOT CONTROLLED WITH YOUR NAUSEA MEDICATION  *UNUSUAL SHORTNESS OF BREATH  *UNUSUAL BRUISING OR BLEEDING  TENDERNESS IN MOUTH AND THROAT WITH OR WITHOUT PRESENCE OF ULCERS  *URINARY PROBLEMS  *BOWEL PROBLEMS  UNUSUAL RASH Items with * indicate a potential emergency and should be followed up as soon as possible.  Feel free to call the clinic should you have any questions or concerns. The clinic phone number is [phone].  Please show the [facility] at check-in to the Emergency Department and triage nurse.

## 2018-09-17 ENCOUNTER — Ambulatory Visit
Admission: RE | Admit: 2018-09-17 | Discharge: 2018-09-17 | Disposition: A | Discharge status: Home / Self Care | Payer: BC Managed Care – PPO | Source: Ambulatory Visit | Attending: Radiation Oncology | Admitting: Radiation Oncology

## 2018-09-17 DIAGNOSIS — C50512 Malignant neoplasm of lower-outer quadrant of left female breast: Secondary | ICD-10-CM | POA: Diagnosis not present

## 2018-09-18 ENCOUNTER — Ambulatory Visit: Payer: BC Managed Care – PPO

## 2018-09-20 ENCOUNTER — Ambulatory Visit
Admission: RE | Admit: 2018-09-20 | Discharge: 2018-09-20 | Disposition: A | Discharge status: Home / Self Care | Payer: BC Managed Care – PPO | Source: Ambulatory Visit | Attending: Radiation Oncology | Admitting: Radiation Oncology

## 2018-09-20 DIAGNOSIS — C50512 Malignant neoplasm of lower-outer quadrant of left female breast: Secondary | ICD-10-CM | POA: Diagnosis not present

## 2018-09-21 ENCOUNTER — Ambulatory Visit
Admission: RE | Admit: 2018-09-21 | Discharge: 2018-09-21 | Disposition: A | Discharge status: Home / Self Care | Payer: BC Managed Care – PPO | Source: Ambulatory Visit | Attending: Radiation Oncology | Admitting: Radiation Oncology

## 2018-09-21 DIAGNOSIS — C50512 Malignant neoplasm of lower-outer quadrant of left female breast: Secondary | ICD-10-CM

## 2018-09-21 DIAGNOSIS — Z171 Estrogen receptor negative status [ER-]: Principal | ICD-10-CM

## 2018-09-21 MED ORDER — RADIAPLEXRX EX GEL
Freq: Once | CUTANEOUS | Status: AC
  Administered 2018-09-21: 13:00:00 via TOPICAL

## 2018-09-22 ENCOUNTER — Ambulatory Visit
Admission: RE | Admit: 2018-09-22 | Discharge: 2018-09-22 | Disposition: A | Discharge status: Home / Self Care | Payer: BC Managed Care – PPO | Source: Ambulatory Visit | Attending: Radiation Oncology | Admitting: Radiation Oncology

## 2018-09-22 DIAGNOSIS — C50512 Malignant neoplasm of lower-outer quadrant of left female breast: Secondary | ICD-10-CM | POA: Diagnosis not present

## 2018-09-23 ENCOUNTER — Ambulatory Visit
Admission: RE | Admit: 2018-09-23 | Discharge: 2018-09-23 | Disposition: A | Discharge status: Home / Self Care | Payer: BC Managed Care – PPO | Source: Ambulatory Visit | Attending: Radiation Oncology | Admitting: Radiation Oncology

## 2018-09-23 DIAGNOSIS — C50512 Malignant neoplasm of lower-outer quadrant of left female breast: Secondary | ICD-10-CM | POA: Diagnosis not present

## 2018-09-24 ENCOUNTER — Ambulatory Visit
Admission: RE | Admit: 2018-09-24 | Discharge: 2018-09-24 | Disposition: A | Discharge status: Home / Self Care | Payer: BC Managed Care – PPO | Source: Ambulatory Visit | Attending: Radiation Oncology | Admitting: Radiation Oncology

## 2018-09-24 DIAGNOSIS — C50512 Malignant neoplasm of lower-outer quadrant of left female breast: Secondary | ICD-10-CM | POA: Diagnosis not present

## 2018-09-27 ENCOUNTER — Ambulatory Visit
Admission: RE | Admit: 2018-09-27 | Discharge: 2018-09-27 | Disposition: A | Discharge status: Home / Self Care | Payer: BC Managed Care – PPO | Source: Ambulatory Visit | Attending: Radiation Oncology | Admitting: Radiation Oncology

## 2018-09-27 DIAGNOSIS — C50512 Malignant neoplasm of lower-outer quadrant of left female breast: Secondary | ICD-10-CM | POA: Diagnosis not present

## 2018-09-28 ENCOUNTER — Ambulatory Visit
Admission: RE | Admit: 2018-09-28 | Discharge: 2018-09-28 | Disposition: A | Discharge status: Home / Self Care | Payer: BC Managed Care – PPO | Source: Ambulatory Visit | Attending: Radiation Oncology | Admitting: Radiation Oncology

## 2018-09-28 DIAGNOSIS — Z171 Estrogen receptor negative status [ER-]: Secondary | ICD-10-CM | POA: Insufficient documentation

## 2018-09-28 DIAGNOSIS — C50512 Malignant neoplasm of lower-outer quadrant of left female breast: Secondary | ICD-10-CM | POA: Insufficient documentation

## 2018-09-28 DIAGNOSIS — Z510 Encounter for antineoplastic radiation therapy: Secondary | ICD-10-CM | POA: Insufficient documentation

## 2018-09-29 ENCOUNTER — Ambulatory Visit
Admission: RE | Admit: 2018-09-29 | Discharge: 2018-09-29 | Disposition: A | Discharge status: Home / Self Care | Payer: BC Managed Care – PPO | Source: Ambulatory Visit | Attending: Radiation Oncology | Admitting: Radiation Oncology

## 2018-09-29 DIAGNOSIS — C50512 Malignant neoplasm of lower-outer quadrant of left female breast: Secondary | ICD-10-CM | POA: Diagnosis not present

## 2018-09-30 ENCOUNTER — Ambulatory Visit
Admission: RE | Admit: 2018-09-30 | Discharge: 2018-09-30 | Disposition: A | Discharge status: Home / Self Care | Payer: BC Managed Care – PPO | Source: Ambulatory Visit | Attending: Radiation Oncology | Admitting: Radiation Oncology

## 2018-09-30 DIAGNOSIS — C50512 Malignant neoplasm of lower-outer quadrant of left female breast: Secondary | ICD-10-CM | POA: Diagnosis not present

## 2018-10-01 ENCOUNTER — Ambulatory Visit
Admission: RE | Admit: 2018-10-01 | Discharge: 2018-10-01 | Disposition: A | Discharge status: Home / Self Care | Payer: BC Managed Care – PPO | Source: Ambulatory Visit | Attending: Radiation Oncology | Admitting: Radiation Oncology

## 2018-10-01 ENCOUNTER — Other Ambulatory Visit: Payer: Self-pay | Admitting: *Deleted

## 2018-10-01 DIAGNOSIS — C50512 Malignant neoplasm of lower-outer quadrant of left female breast: Secondary | ICD-10-CM

## 2018-10-01 DIAGNOSIS — Z171 Estrogen receptor negative status [ER-]: Principal | ICD-10-CM

## 2018-10-04 ENCOUNTER — Ambulatory Visit
Admission: RE | Admit: 2018-10-04 | Discharge: 2018-10-04 | Disposition: A | Discharge status: Home / Self Care | Payer: BC Managed Care – PPO | Source: Ambulatory Visit | Attending: Radiation Oncology | Admitting: Radiation Oncology

## 2018-10-04 DIAGNOSIS — C50512 Malignant neoplasm of lower-outer quadrant of left female breast: Secondary | ICD-10-CM | POA: Diagnosis not present

## 2018-10-05 ENCOUNTER — Ambulatory Visit
Admission: RE | Admit: 2018-10-05 | Discharge: 2018-10-05 | Disposition: A | Discharge status: Home / Self Care | Payer: BC Managed Care – PPO | Source: Ambulatory Visit | Attending: Radiation Oncology | Admitting: Radiation Oncology

## 2018-10-05 DIAGNOSIS — C50512 Malignant neoplasm of lower-outer quadrant of left female breast: Secondary | ICD-10-CM

## 2018-10-05 DIAGNOSIS — Z171 Estrogen receptor negative status [ER-]: Principal | ICD-10-CM

## 2018-10-05 MED ORDER — SONAFINE EX EMUL
1.0000 "application " | Freq: Once | CUTANEOUS | Status: AC
  Administered 2018-10-05: 1 via TOPICAL

## 2018-10-06 ENCOUNTER — Ambulatory Visit
Admission: RE | Admit: 2018-10-06 | Discharge: 2018-10-06 | Disposition: A | Discharge status: Home / Self Care | Payer: BC Managed Care – PPO | Source: Ambulatory Visit | Attending: Radiation Oncology | Admitting: Radiation Oncology

## 2018-10-06 DIAGNOSIS — C50512 Malignant neoplasm of lower-outer quadrant of left female breast: Secondary | ICD-10-CM | POA: Diagnosis not present

## 2018-10-06 NOTE — Progress Notes (Signed)
[place]  [phone] [phone]     ID: Monica Padilla DOB: 1966-04-29  MR#: [id number]  KDT#:[id number]  Patient Care Team: Algis Greenhouse, MD as PCP - General (Family Medicine) Signe Colt, MD as Referring Physician (Obstetrics and Gynecology) Magrinat, Virgie Dad, MD as Consulting Physician (Oncology) Larey Dresser, MD as Consulting Physician (Cardiology) Noberto Retort Juanda Bond., MD as Referring Physician (Surgery) Gatha Mayer, MD as Consulting Physician (Gastroenterology) Nolon Nations, MD as Consulting Physician (Diagnostic Radiology) OTHER MD: Dr. Jimmye Norman and Dr. Michele Mcalpine at [facility]: Estrogen receptor negative, HER-2 positive breast cancer  CURRENT TREATMENT: trastuzumab  INTERVAL HISTORY: Monica Padilla returns today for a follow-up and treatment of her estrogen receptor negative, HER-2 positive breast cancer. She received Carboplatin and docetaxel, trastuzumab, and pertuzumab every 21 days through 08/26/2018.  Currently she is receiving trastuzumab alone, with a dose due today. She denies any issues with her last treatment, however, she notes that she didn't have the Pertuzumab with her last cycle due to breaking out in a rash, which has resolved.  Also she is currently receiving adjuvant radiation therapy with Radiation Oncologist, Dr. Sondra Come. She has completed 16 out of 33 total treatments of her radiation treatments and she has noted mild itching so far to the radiation site, however, she notes that the cream that was given has alleviated her symptoms.   Since her last visit to the office, she had an echocardiogram completed on 08/12/2018 that showed an ejection fraction of 65-70%.    REVIEW OF SYSTEMS: Monica Padilla reports that for exercise, she has been walking some, however, she hasn't been doing it lately due to the hot weather. She will typically walk up to 2-3 miles at a time. She denies unusual  headaches, visual changes, nausea, vomiting, or dizziness. There has been no unusual cough, phlegm production, or pleurisy. This been no change in bowel or bladder habits. She denies unexplained fatigue or unexplained weight loss, bleeding, rash, or fever. A detailed review of systems was otherwise stable.      HISTORY OF CURRENT ILLNESS: From the original intake note:  The patient noted a palpable mass in her left breast as she was laying down. She brought this to medical attention immediately and had left diagnostic mammography with tomography at breast center  03/11/2018 showing a 2.5 cm palpable mass in the lower outer quadrant of the left breast, with one slightly prominent lymph node that had increased cortical measurement.  Biopsy of this left breast mass 03/17/2018 showed ([id number]), invasive ductal carcinoma, grade 2, estrogen and progesterone receptor negative, with an MIB-1 of 80%, but HER-2 amplified with a signals ratio 5.51 and the number per cell 17.35.  She underwent left mastectomy and sentinel lymph node sampling, as well as right-sided port placement 04/02/2018 under Etheleen Mayhew at [facility] for a 2.6 cm invasive ductal carcinoma, grade 3, with 1 of 3 sentinel nodes showing a micrometastatic deposit, but all margins negative.  She had a port placed at the same time.  The patient's subsequent history is as detailed below.    PAST MEDICAL HISTORY: Past Medical History:  Diagnosis Date  . Breast cancer ([place])   . Cancer ([facility])    basal cell CA- chest, leg  . Family history of breast cancer   . Family history of colon cancer   . Family history of lung cancer   . GERD (gastroesophageal reflux disease)   s.   PAST SURGICAL HISTORY:  Past Surgical History:  Procedure Laterality Date  . PARTIAL HYSTERECTOMY    . SKIN CANCER EXCISION      FAMILY HISTORY Family History  Problem Relation Age of Onset  . Colon cancer Father 19  . Basal cell carcinoma Sister         2-3  . Lung cancer Maternal Aunt        hx smoking  . Lung cancer Paternal Uncle        hx smoking  . Heart disease Maternal Grandmother   . Heart disease Maternal Grandfather   . Cancer Paternal Grandfather        unk if colon or stomach? died in early 18's.   . Esophageal cancer Neg Hx   . Stomach cancer Neg Hx   . Rectal cancer Neg Hx    The patient's father died due to colon cancer at age 47. The patient's mother is alive at age 68. The patient has 1 sister and no brothers. A paternal cousin was diagnosed with breast cancer in the early 57's (before 76). The patient's paternal grandfather passed away from colon cancer. The patient's father had 3 brothers, one of whom had lung cancer.   GYNECOLOGIC HISTORY:  No LMP recorded. Patient has had a hysterectomy. Menarche: 52 years old Age at first live birth: 52 years old She is GXP2.  The patient is status post partial hysterectomy due to uterine fibroids. She retains her cervix and ovaries.  After her hysterectomy she used Estradiol with no complications.  She stopped hormone replacement March 2019.  She is now having hot flashes and nigh sweats that only allow her to sleep 3 hours at a time.   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 Her husband, Monica Padilla, is a Hydrologist for alarms and cameras for [employer]. The patient's oldest son, Monica Padilla, is 68 and works as a Civil engineer, contracting in [place]. The patient's son, Monica Padilla, is 21 an works for a Dispensing optician. Monica Padilla lives at home with the patient. Monica Padilla does not have any grandchildren. She attends [employer].     ADVANCED DIRECTIVES:    HEALTH MAINTENANCE: Social History   Tobacco Use  . Smoking status: Never Smoker  . Smokeless tobacco: Never Used  Substance Use Topics  . Alcohol use: Yes    Comment: occasional  . Drug use: No     Colonoscopy: 10/04/2013/ Dr. Carlean Purl / polyp removal  PAP:  Bone density:  remote   Allergies  Allergen Reactions  . Codeine Itching  . Nitrofurantoin     REACTION: pruritis  . Amoxicillin Rash  . Pertuzumab Rash    Current Outpatient Medications  Medication Sig Dispense Refill  . lidocaine-prilocaine (EMLA) cream Apply to affected area once 30 g 3  . LORazepam (ATIVAN) 0.5 MG tablet TAKE ONE TABLET DAILY AT BEDTIME AS NEEDED FOR ANXIETY 30 tablet 1  . Multiple Vitamins-Minerals (WOMENS MULTI VITAMIN & MINERAL) TABS Take 1 each by mouth.    . pantoprazole (PROTONIX) 40 MG tablet Take by mouth.     No current facility-administered medications for this visit.     OBJECTIVE: Young appearing white woman who appears well  Vitals:   10/07/18 0954  BP: 135/77  Pulse: 67  Resp: 18  Temp: 97.8 F (36.6 C)  SpO2: 100%     Body mass index is 24.66 kg/m.   Wt Readings from Last 3 Encounters:  10/07/18 130 lb 8 oz (59.2  kg)  09/16/18 132 lb 9.6 oz (60.1 kg)  09/06/18 133 lb 6.4 oz (60.5 kg)      ECOG FS:1   Sclerae unicteric, EOMs intact No cervical or supraclavicular adenopathy Lungs no rales or rhonchi Heart regular rate and rhythm Abd soft, nontender, positive bowel sounds MSK no focal spinal tenderness, no upper extremity lymphedema Neuro: nonfocal, well oriented, appropriate affect Breasts: The right breast is unremarkable.  The left breast is status post mastectomy.  There is no evidence of chest wall recurrence.  There is no significant erythema and there is no desquamation.  Both axillae are benign.   LAB RESULTS:  CMP     Component Value Date/Time   NA 141 10/07/2018 0943   K 3.8 10/07/2018 0943   CL 106 10/07/2018 0943   CO2 25 10/07/2018 0943   GLUCOSE 87 10/07/2018 0943   BUN 11 10/07/2018 0943   CREATININE 0.66 10/07/2018 0943   CALCIUM 9.6 10/07/2018 0943   PROT 6.6 10/07/2018 0943   ALBUMIN 4.0 10/07/2018 0943   AST 24 10/07/2018 0943   ALT 24 10/07/2018 0943   ALKPHOS 89 10/07/2018 0943   BILITOT 0.5 10/07/2018 0943    GFRNONAA >60 10/07/2018 0943   GFRAA >60 10/07/2018 0943    No results found for: TOTALPROTELP, ALBUMINELP, A1GS, A2GS, BETS, BETA2SER, GAMS, MSPIKE, SPEI  No results found for: KPAFRELGTCHN, LAMBDASER, KAPLAMBRATIO  Lab Results  Component Value Date   WBC 3.4 (L) 10/07/2018   NEUTROABS 2.0 10/07/2018   HGB 12.3 10/07/2018   HCT 36.1 10/07/2018   MCV 94.5 10/07/2018   PLT 140 (L) 10/07/2018    _0 @  No results found for: LABCA2  No components found for: [id number]  No results for input(s): INR in the last 168 hours.  No results found for: LABCA2  No results found for: MOQ947  No results found for: MLY650  No results found for: PTW656  No results found for: CA2729  No components found for: HGQUANT  No results found for: CEA1 / No results found for: CEA1   No results found for: AFPTUMOR  No results found for: [place]  No results found for: PSA1  Appointment on 10/07/2018  Component Date Value Ref Range Status  . WBC 10/07/2018 3.4* 4.0 - 10.5 K/uL Final  . RBC 10/07/2018 3.82* 3.87 - 5.11 MIL/uL Final  . Hemoglobin 10/07/2018 12.3  12.0 - 15.0 g/dL Final  . HCT 10/07/2018 36.1  36.0 - 46.0 % Final  . MCV 10/07/2018 94.5  80.0 - 100.0 fL Final  . MCH 10/07/2018 32.2  26.0 - 34.0 pg Final  . MCHC 10/07/2018 34.1  30.0 - 36.0 g/dL Final  . RDW 10/07/2018 12.4  11.5 - 15.5 % Final  . Platelets 10/07/2018 140* 150 - 400 K/uL Final  . nRBC 10/07/2018 0.0  0.0 - 0.2 % Final  . Neutrophils Relative % 10/07/2018 59  % Final  . Neutro Abs 10/07/2018 2.0  1.7 - 7.7 K/uL Final  . Lymphocytes Relative 10/07/2018 27  % Final  . Lymphs Abs 10/07/2018 0.9  0.7 - 4.0 K/uL Final  . Monocytes Relative 10/07/2018 8  % Final  . Monocytes Absolute 10/07/2018 0.3  0.1 - 1.0 K/uL Final  . Eosinophils Relative 10/07/2018 5  % Final  . Eosinophils Absolute 10/07/2018 0.2  0.0 - 0.5 K/uL Final  . Basophils Relative 10/07/2018 1  % Final  . Basophils Absolute  10/07/2018 0.0  0.0 - 0.1 K/uL Final  . Immature  Granulocytes 10/07/2018 0  % Final  . Abs Immature Granulocytes 10/07/2018 0.01  0.00 - 0.07 K/uL Final   Performed at [facility], [place] [address], [place], [place] [address]  . Sodium 10/07/2018 141  135 - 145 mmol/L Final  . Potassium 10/07/2018 3.8  3.5 - 5.1 mmol/L Final  . Chloride 10/07/2018 106  98 - 111 mmol/L Final  . CO2 10/07/2018 25  22 - 32 mmol/L Final  . Glucose, Bld 10/07/2018 87  70 - 99 mg/dL Final  . BUN 10/07/2018 11  6 - 20 mg/dL Final  . Creatinine, Ser 10/07/2018 0.66  0.44 - 1.00 mg/dL Final  . Calcium 10/07/2018 9.6  8.9 - 10.3 mg/dL Final  . Total Protein 10/07/2018 6.6  6.5 - 8.1 g/dL Final  . Albumin 10/07/2018 4.0  3.5 - 5.0 g/dL Final  . AST 10/07/2018 24  15 - 41 U/L Final  . ALT 10/07/2018 24  0 - 44 U/L Final  . Alkaline Phosphatase 10/07/2018 89  38 - 126 U/L Final  . Total Bilirubin 10/07/2018 0.5  0.3 - 1.2 mg/dL Final  . GFR calc non Af Amer 10/07/2018 >60  >60 mL/min Final  . GFR calc Af Amer 10/07/2018 >60  >60 mL/min Final   Comment: (NOTE) The eGFR has been calculated using the CKD EPI equation. This calculation has not been validated in all clinical situations. eGFR's persistently <60 mL/min signify possible Chronic Kidney Disease.   Georgiann Hahn gap 10/07/2018 10  5 - 15 Final   Performed at [facility], [place] [address] [place], [place] [address]    (this displays the last labs from the last 3 days)  No results found for: TOTALPROTELP, ALBUMINELP, A1GS, A2GS, BETS, BETA2SER, GAMS, MSPIKE, SPEI (this displays SPEP labs)  No results found for: KPAFRELGTCHN, LAMBDASER, KAPLAMBRATIO (kappa/lambda light chains)  No results found for: HGBA, HGBA2QUANT, HGBFQUANT, HGBSQUAN (Hemoglobinopathy evaluation)   No results found for: LDH  No results found for: IRON, TIBC, IRONPCTSAT (Iron and TIBC)  No results found for: FERRITIN  Urinalysis No  results found for: COLORURINE, APPEARANCEUR, LABSPEC, PHURINE, GLUCOSEU, HGBUR, BILIRUBINUR, KETONESUR, PROTEINUR, UROBILINOGEN, NITRITE, LEUKOCYTESUR   STUDIES: Echo results discussed with the patient  ELIGIBLE FOR AVAILABLE RESEARCH PROTOCOL: not discussed  ASSESSMENT: 52 y.o. [place], [place] woman status post left mastectomy and sentinel lymph node sampling 04/02/2018 for a pT2 pN1, stage IIB invasive ductal carcinoma, grade 3, estrogen and progesterone receptor negative, but HER-2 amplified  (1) genetics testing 06/15/2018  (2) adjuvant chemotherapy consisting of carboplatin and docetaxel given every 21 days x 6, started 05/13/2018, completed 08/26/2018  (3) trastuzumab and Pertuzumab started 05/13/2018; trastuzumab to be continued to complete a year  (a) epratuzumab discontinued after cycle 2 because of allergic reactions  (b) echocardiogram 05/06/2018 shows an ejection fraction in the 65-70%  (c) echocardiogram 08/12/2018 shows an ejection fraction in the 65-70%  (4) adjuvant radiation in process  (5) considering reconstruction eventually    PLAN: Shemekia recovered well from her chemotherapy and is tolerating her radiation well.  She is halfway through that radiation course.  She has no problems from the trastuzumab.  She is scheduled for echocardiography early November.  She is beginning to think about reconstruction.  Today we had a discussion regarding the several options.  If any she would probably choose silicone implants, but she has not decided to go through with this and is not ready to see a Psychiatric nurse at this  point.  Because her chemoimmunotherapy had to be interrupted and her epratuzumab discontinued we are going to continue the trastuzumab for a total of 1 year.  I am going to see her again November 18, 2018, after her next echocardiogram.  I will then see her every 3 months until she completes her treatment  She knows to call for any other issues that may  develop before the next visit.   Magrinat, Virgie Dad, MD  10/07/18 10:40 AM Medical Oncology and Hematology [facility] [address] [place], [place] [address] Tel. [phone]    Fax. [phone]    I, Soijett Blue am acting as scribe for Dr. Sarajane Jews C. Magrinat.  I, Lurline Del MD, have reviewed the above documentation for accuracy and completeness, and I agree with the above.

## 2018-10-07 ENCOUNTER — Telehealth: Payer: Self-pay | Admitting: Oncology

## 2018-10-07 ENCOUNTER — Inpatient Hospital Stay: Payer: BC Managed Care – PPO

## 2018-10-07 ENCOUNTER — Other Ambulatory Visit: Payer: BC Managed Care – PPO

## 2018-10-07 ENCOUNTER — Ambulatory Visit: Payer: BC Managed Care – PPO

## 2018-10-07 ENCOUNTER — Ambulatory Visit
Admission: RE | Admit: 2018-10-07 | Discharge: 2018-10-07 | Disposition: A | Discharge status: Home / Self Care | Payer: BC Managed Care – PPO | Source: Ambulatory Visit | Attending: Radiation Oncology | Admitting: Radiation Oncology

## 2018-10-07 ENCOUNTER — Inpatient Hospital Stay (HOSPITAL_BASED_OUTPATIENT_CLINIC_OR_DEPARTMENT_OTHER): Payer: BC Managed Care – PPO | Admitting: Oncology

## 2018-10-07 ENCOUNTER — Inpatient Hospital Stay: Payer: BC Managed Care – PPO | Attending: Oncology

## 2018-10-07 VITALS — BP 135/77 | HR 67 | Temp 97.8°F | Resp 18 | Ht 61.0 in | Wt 130.5 lb

## 2018-10-07 DIAGNOSIS — Z808 Family history of malignant neoplasm of other organs or systems: Secondary | ICD-10-CM

## 2018-10-07 DIAGNOSIS — Z171 Estrogen receptor negative status [ER-]: Secondary | ICD-10-CM

## 2018-10-07 DIAGNOSIS — Z9012 Acquired absence of left breast and nipple: Secondary | ICD-10-CM | POA: Diagnosis not present

## 2018-10-07 DIAGNOSIS — C50512 Malignant neoplasm of lower-outer quadrant of left female breast: Secondary | ICD-10-CM

## 2018-10-07 DIAGNOSIS — Z13220 Encounter for screening for lipoid disorders: Secondary | ICD-10-CM

## 2018-10-07 DIAGNOSIS — Z800 Family history of malignant neoplasm of digestive organs: Secondary | ICD-10-CM

## 2018-10-07 DIAGNOSIS — Z801 Family history of malignant neoplasm of trachea, bronchus and lung: Secondary | ICD-10-CM

## 2018-10-07 DIAGNOSIS — Z5112 Encounter for antineoplastic immunotherapy: Secondary | ICD-10-CM | POA: Insufficient documentation

## 2018-10-07 DIAGNOSIS — Z95828 Presence of other vascular implants and grafts: Secondary | ICD-10-CM

## 2018-10-07 LAB — LIPID PANEL
CHOL/HDL RATIO: 3.3 ratio
Cholesterol: 230 mg/dL — ABNORMAL HIGH (ref 0–200)
HDL: 69 mg/dL (ref >40)
LDL CALC: 137 mg/dL — AB (ref 0–99)
Triglycerides: 119 mg/dL (ref <150)
VLDL: 24 mg/dL (ref 0–40)

## 2018-10-07 LAB — COMPREHENSIVE METABOLIC PANEL
ALT: 24 U/L (ref 0–44)
AST: 24 U/L (ref 15–41)
Albumin: 4 g/dL (ref 3.5–5.0)
Alkaline Phosphatase: 89 U/L (ref 38–126)
Anion gap: 10 (ref 5–15)
BILIRUBIN TOTAL: 0.5 mg/dL (ref 0.3–1.2)
BUN: 11 mg/dL (ref 6–20)
CO2: 25 mmol/L (ref 22–32)
Calcium: 9.6 mg/dL (ref 8.9–10.3)
Chloride: 106 mmol/L (ref 98–111)
Creatinine, Ser: 0.66 mg/dL (ref 0.44–1.00)
Glucose, Bld: 87 mg/dL (ref 70–99)
Potassium: 3.8 mmol/L (ref 3.5–5.1)
Sodium: 141 mmol/L (ref 135–145)
TOTAL PROTEIN: 6.6 g/dL (ref 6.5–8.1)

## 2018-10-07 LAB — CBC WITH DIFFERENTIAL/PLATELET
ABS IMMATURE GRANULOCYTES: 0.01 10*3/uL (ref 0.00–0.07)
BASOS PCT: 1 %
Basophils Absolute: 0 10*3/uL (ref 0.0–0.1)
Eosinophils Absolute: 0.2 10*3/uL (ref 0.0–0.5)
Eosinophils Relative: 5 %
HEMATOCRIT: 36.1 % (ref 36.0–46.0)
HEMOGLOBIN: 12.3 g/dL (ref 12.0–15.0)
IMMATURE GRANULOCYTES: 0 %
LYMPHS ABS: 0.9 10*3/uL (ref 0.7–4.0)
LYMPHS PCT: 27 %
MCH: 32.2 pg (ref 26.0–34.0)
MCHC: 34.1 g/dL (ref 30.0–36.0)
MCV: 94.5 fL (ref 80.0–100.0)
MONO ABS: 0.3 10*3/uL (ref 0.1–1.0)
MONOS PCT: 8 %
NEUTROS ABS: 2 10*3/uL (ref 1.7–7.7)
Neutrophils Relative %: 59 %
Platelets: 140 10*3/uL — ABNORMAL LOW (ref 150–400)
RBC: 3.82 MIL/uL — ABNORMAL LOW (ref 3.87–5.11)
RDW: 12.4 % (ref 11.5–15.5)
WBC: 3.4 10*3/uL — ABNORMAL LOW (ref 4.0–10.5)
nRBC: 0 % (ref 0.0–0.2)

## 2018-10-07 MED ORDER — DIPHENHYDRAMINE HCL 25 MG PO CAPS
50.0000 mg | ORAL_CAPSULE | Freq: Once | ORAL | Status: AC
  Administered 2018-10-07: 50 mg via ORAL

## 2018-10-07 MED ORDER — DIPHENHYDRAMINE HCL 25 MG PO CAPS
ORAL_CAPSULE | ORAL | Status: AC
  Filled 2018-10-07: qty 2

## 2018-10-07 MED ORDER — SODIUM CHLORIDE 0.9% FLUSH
10.0000 mL | INTRAVENOUS | Status: DC | PRN
  Administered 2018-10-07: 10 mL
  Filled 2018-10-07: qty 10

## 2018-10-07 MED ORDER — HEPARIN SOD (PORK) LOCK FLUSH 100 UNIT/ML IV SOLN
500.0000 [IU] | Freq: Once | INTRAVENOUS | Status: AC | PRN
  Administered 2018-10-07: 500 [IU]
  Filled 2018-10-07: qty 5

## 2018-10-07 MED ORDER — TRASTUZUMAB CHEMO 150 MG IV SOLR
6.0000 mg/kg | Freq: Once | INTRAVENOUS | Status: AC
  Administered 2018-10-07: 336 mg via INTRAVENOUS
  Filled 2018-10-07: qty 16

## 2018-10-07 MED ORDER — SODIUM CHLORIDE 0.9 % IV SOLN
Freq: Once | INTRAVENOUS | Status: AC
  Administered 2018-10-07: 11:00:00 via INTRAVENOUS
  Filled 2018-10-07: qty 250

## 2018-10-07 MED ORDER — ACETAMINOPHEN 325 MG PO TABS
ORAL_TABLET | ORAL | Status: AC
  Filled 2018-10-07: qty 2

## 2018-10-07 MED ORDER — ACETAMINOPHEN 325 MG PO TABS
650.0000 mg | ORAL_TABLET | Freq: Once | ORAL | Status: AC
  Administered 2018-10-07: 650 mg via ORAL

## 2018-10-07 NOTE — Patient Instructions (Addendum)
[facility] Discharge Instructions for Patients Receiving Chemotherapy  Today you received the following chemotherapy agents Herceptin.  To help prevent nausea and vomiting after your treatment, we encourage you to take your nausea medication.   If you develop nausea and vomiting that is not controlled by your nausea medication, call the clinic.   BELOW ARE SYMPTOMS THAT SHOULD BE REPORTED IMMEDIATELY:  *FEVER GREATER THAN 100.5 F  *CHILLS WITH OR WITHOUT FEVER  NAUSEA AND VOMITING THAT IS NOT CONTROLLED WITH YOUR NAUSEA MEDICATION  *UNUSUAL SHORTNESS OF BREATH  *UNUSUAL BRUISING OR BLEEDING  TENDERNESS IN MOUTH AND THROAT WITH OR WITHOUT PRESENCE OF ULCERS  *URINARY PROBLEMS  *BOWEL PROBLEMS  UNUSUAL RASH Items with * indicate a potential emergency and should be followed up as soon as possible.  Feel free to call the clinic should you have any questions or concerns. The clinic phone number is [phone].  Please show the CHEMO ALERT CARD at check-in to the Emergency Department and triage nurse.   

## 2018-10-07 NOTE — Telephone Encounter (Signed)
Gave pt avs and calendar  °

## 2018-10-08 ENCOUNTER — Ambulatory Visit
Admission: RE | Admit: 2018-10-08 | Discharge: 2018-10-08 | Disposition: A | Discharge status: Home / Self Care | Payer: BC Managed Care – PPO | Source: Ambulatory Visit | Attending: Radiation Oncology | Admitting: Radiation Oncology

## 2018-10-08 DIAGNOSIS — C50512 Malignant neoplasm of lower-outer quadrant of left female breast: Secondary | ICD-10-CM | POA: Diagnosis not present

## 2018-10-09 ENCOUNTER — Ambulatory Visit: Payer: BC Managed Care – PPO

## 2018-10-11 ENCOUNTER — Ambulatory Visit
Admission: RE | Admit: 2018-10-11 | Discharge: 2018-10-11 | Disposition: A | Discharge status: Home / Self Care | Payer: BC Managed Care – PPO | Source: Ambulatory Visit | Attending: Radiation Oncology | Admitting: Radiation Oncology

## 2018-10-11 DIAGNOSIS — C50512 Malignant neoplasm of lower-outer quadrant of left female breast: Secondary | ICD-10-CM | POA: Diagnosis not present

## 2018-10-12 ENCOUNTER — Ambulatory Visit
Admission: RE | Admit: 2018-10-12 | Discharge: 2018-10-12 | Disposition: A | Discharge status: Home / Self Care | Payer: BC Managed Care – PPO | Source: Ambulatory Visit | Attending: Radiation Oncology | Admitting: Radiation Oncology

## 2018-10-12 DIAGNOSIS — Z171 Estrogen receptor negative status [ER-]: Principal | ICD-10-CM

## 2018-10-12 DIAGNOSIS — C50512 Malignant neoplasm of lower-outer quadrant of left female breast: Secondary | ICD-10-CM

## 2018-10-12 MED ORDER — SONAFINE EX EMUL
1.0000 "application " | Freq: Two times a day (BID) | CUTANEOUS | Status: DC
  Administered 2018-10-12: 1 via TOPICAL

## 2018-10-12 NOTE — Progress Notes (Signed)
Simulation note The patient was brought to the treatment room for simulation for the patient's upcoming electron treatment. The patient was setup in the treatment position and the target region was delineated. The patient will receive treatment to the left  chest wall using an en face electron field. One customized block/complex treatment device has been constructed for this purpose, and this will be used on a daily basis during the patient's treatment. After appropriate set up was confirmed, skin markings were placed to allow accurate targeting of the treatment area during the patient's course of therapy.  ------------------------------------------------  -----------------------------------  Blair Promise, PhD, MD

## 2018-10-13 ENCOUNTER — Ambulatory Visit
Admission: RE | Admit: 2018-10-13 | Discharge: 2018-10-13 | Disposition: A | Discharge status: Home / Self Care | Payer: BC Managed Care – PPO | Source: Ambulatory Visit | Attending: Radiation Oncology | Admitting: Radiation Oncology

## 2018-10-13 DIAGNOSIS — C50512 Malignant neoplasm of lower-outer quadrant of left female breast: Secondary | ICD-10-CM | POA: Diagnosis not present

## 2018-10-14 ENCOUNTER — Ambulatory Visit
Admission: RE | Admit: 2018-10-14 | Discharge: 2018-10-14 | Disposition: A | Discharge status: Home / Self Care | Payer: BC Managed Care – PPO | Source: Ambulatory Visit | Attending: Radiation Oncology | Admitting: Radiation Oncology

## 2018-10-14 DIAGNOSIS — Z171 Estrogen receptor negative status [ER-]: Principal | ICD-10-CM

## 2018-10-14 DIAGNOSIS — C50512 Malignant neoplasm of lower-outer quadrant of left female breast: Secondary | ICD-10-CM | POA: Diagnosis not present

## 2018-10-14 MED ORDER — SILVER SULFADIAZINE 1 % EX CREA
TOPICAL_CREAM | Freq: Two times a day (BID) | CUTANEOUS | Status: DC
  Administered 2018-10-14: 17:00:00 via TOPICAL

## 2018-10-15 ENCOUNTER — Ambulatory Visit
Admission: RE | Admit: 2018-10-15 | Discharge: 2018-10-15 | Disposition: A | Discharge status: Home / Self Care | Payer: BC Managed Care – PPO | Source: Ambulatory Visit | Attending: Radiation Oncology | Admitting: Radiation Oncology

## 2018-10-15 DIAGNOSIS — C50512 Malignant neoplasm of lower-outer quadrant of left female breast: Secondary | ICD-10-CM | POA: Diagnosis not present

## 2018-10-18 ENCOUNTER — Ambulatory Visit
Admission: RE | Admit: 2018-10-18 | Discharge: 2018-10-18 | Disposition: A | Discharge status: Home / Self Care | Payer: BC Managed Care – PPO | Source: Ambulatory Visit | Attending: Radiation Oncology | Admitting: Radiation Oncology

## 2018-10-18 DIAGNOSIS — C50512 Malignant neoplasm of lower-outer quadrant of left female breast: Secondary | ICD-10-CM | POA: Diagnosis not present

## 2018-10-19 ENCOUNTER — Ambulatory Visit
Admission: RE | Admit: 2018-10-19 | Discharge: 2018-10-19 | Disposition: A | Discharge status: Home / Self Care | Payer: BC Managed Care – PPO | Source: Ambulatory Visit | Attending: Radiation Oncology | Admitting: Radiation Oncology

## 2018-10-19 DIAGNOSIS — C50512 Malignant neoplasm of lower-outer quadrant of left female breast: Secondary | ICD-10-CM | POA: Diagnosis not present

## 2018-10-19 DIAGNOSIS — Z171 Estrogen receptor negative status [ER-]: Principal | ICD-10-CM

## 2018-10-19 NOTE — Progress Notes (Signed)
.  Simulation verification  The patient was brought to the treatment machine and placed in the plan treatment position.  Clinical set up was verified to ensure that the target region is appropriately covered for the patient's upcoming electron boost treatment.  The targeted volume of tissue is appropriately covered by the radiation field.  Based on my personal review, I approve the simulation verification.  The patient's treatment will proceed as planned.  ------------------------------------------------  -----------------------------------  Keilan Nichol D. Deisi Salonga, PhD, MD  

## 2018-10-20 ENCOUNTER — Ambulatory Visit
Admission: RE | Admit: 2018-10-20 | Discharge: 2018-10-20 | Disposition: A | Discharge status: Home / Self Care | Payer: BC Managed Care – PPO | Source: Ambulatory Visit | Attending: Radiation Oncology | Admitting: Radiation Oncology

## 2018-10-20 DIAGNOSIS — C50512 Malignant neoplasm of lower-outer quadrant of left female breast: Secondary | ICD-10-CM | POA: Diagnosis not present

## 2018-10-21 ENCOUNTER — Ambulatory Visit
Admission: RE | Admit: 2018-10-21 | Discharge: 2018-10-21 | Disposition: A | Discharge status: Home / Self Care | Payer: BC Managed Care – PPO | Source: Ambulatory Visit | Attending: Radiation Oncology | Admitting: Radiation Oncology

## 2018-10-21 DIAGNOSIS — C50512 Malignant neoplasm of lower-outer quadrant of left female breast: Secondary | ICD-10-CM | POA: Diagnosis not present

## 2018-10-22 ENCOUNTER — Ambulatory Visit
Admission: RE | Admit: 2018-10-22 | Discharge: 2018-10-22 | Disposition: A | Discharge status: Home / Self Care | Payer: BC Managed Care – PPO | Source: Ambulatory Visit | Attending: Radiation Oncology | Admitting: Radiation Oncology

## 2018-10-22 DIAGNOSIS — C50512 Malignant neoplasm of lower-outer quadrant of left female breast: Secondary | ICD-10-CM | POA: Diagnosis not present

## 2018-10-25 ENCOUNTER — Ambulatory Visit
Admission: RE | Admit: 2018-10-25 | Discharge: 2018-10-25 | Disposition: A | Discharge status: Home / Self Care | Payer: BC Managed Care – PPO | Source: Ambulatory Visit | Attending: Radiation Oncology | Admitting: Radiation Oncology

## 2018-10-25 DIAGNOSIS — C50512 Malignant neoplasm of lower-outer quadrant of left female breast: Secondary | ICD-10-CM | POA: Diagnosis not present

## 2018-10-26 ENCOUNTER — Other Ambulatory Visit: Payer: Self-pay | Admitting: Oncology

## 2018-10-26 ENCOUNTER — Ambulatory Visit
Admission: RE | Admit: 2018-10-26 | Discharge: 2018-10-26 | Disposition: A | Discharge status: Home / Self Care | Payer: BC Managed Care – PPO | Source: Ambulatory Visit | Attending: Radiation Oncology | Admitting: Radiation Oncology

## 2018-10-26 DIAGNOSIS — C50512 Malignant neoplasm of lower-outer quadrant of left female breast: Secondary | ICD-10-CM | POA: Diagnosis not present

## 2018-10-27 ENCOUNTER — Ambulatory Visit
Admission: RE | Admit: 2018-10-27 | Discharge: 2018-10-27 | Disposition: A | Discharge status: Home / Self Care | Payer: BC Managed Care – PPO | Source: Ambulatory Visit | Attending: Radiation Oncology | Admitting: Radiation Oncology

## 2018-10-27 DIAGNOSIS — C50512 Malignant neoplasm of lower-outer quadrant of left female breast: Secondary | ICD-10-CM | POA: Diagnosis not present

## 2018-10-28 ENCOUNTER — Inpatient Hospital Stay: Payer: BC Managed Care – PPO

## 2018-10-28 ENCOUNTER — Ambulatory Visit
Admission: RE | Admit: 2018-10-28 | Discharge: 2018-10-28 | Disposition: A | Discharge status: Home / Self Care | Payer: BC Managed Care – PPO | Source: Ambulatory Visit | Attending: Radiation Oncology | Admitting: Radiation Oncology

## 2018-10-28 ENCOUNTER — Other Ambulatory Visit: Payer: Self-pay | Admitting: Oncology

## 2018-10-28 VITALS — BP 137/80 | HR 71 | Temp 98.1°F | Resp 16

## 2018-10-28 DIAGNOSIS — Z171 Estrogen receptor negative status [ER-]: Principal | ICD-10-CM

## 2018-10-28 DIAGNOSIS — C50512 Malignant neoplasm of lower-outer quadrant of left female breast: Secondary | ICD-10-CM

## 2018-10-28 LAB — COMPREHENSIVE METABOLIC PANEL
ALBUMIN: 4.1 g/dL (ref 3.5–5.0)
ALK PHOS: 95 U/L (ref 38–126)
ALT: 45 U/L — AB (ref 0–44)
ANION GAP: 7 (ref 5–15)
AST: 38 U/L (ref 15–41)
BILIRUBIN TOTAL: 0.6 mg/dL (ref 0.3–1.2)
BUN: 10 mg/dL (ref 6–20)
CALCIUM: 9.7 mg/dL (ref 8.9–10.3)
CO2: 29 mmol/L (ref 22–32)
Chloride: 107 mmol/L (ref 98–111)
Creatinine, Ser: 0.7 mg/dL (ref 0.44–1.00)
GFR calc Af Amer: >60 mL/min (ref >60)
GFR calc non Af Amer: >60 mL/min (ref >60)
GLUCOSE: 86 mg/dL (ref 70–99)
Potassium: 3.8 mmol/L (ref 3.5–5.1)
SODIUM: 143 mmol/L (ref 135–145)
Total Protein: 6.7 g/dL (ref 6.5–8.1)

## 2018-10-28 LAB — LIPID PANEL
CHOL/HDL RATIO: 2.9 ratio
Cholesterol: 219 mg/dL — ABNORMAL HIGH (ref 0–200)
HDL: 76 mg/dL (ref >40)
LDL Cholesterol: 119 mg/dL — ABNORMAL HIGH (ref 0–99)
TRIGLYCERIDES: 118 mg/dL (ref <150)
VLDL: 24 mg/dL (ref 0–40)

## 2018-10-28 LAB — CBC WITH DIFFERENTIAL/PLATELET
ABS IMMATURE GRANULOCYTES: 0.01 10*3/uL (ref 0.00–0.07)
BASOS ABS: 0 10*3/uL (ref 0.0–0.1)
Basophils Relative: 1 %
Eosinophils Absolute: 0.2 10*3/uL (ref 0.0–0.5)
Eosinophils Relative: 5 %
HEMATOCRIT: 36.1 % (ref 36.0–46.0)
HEMOGLOBIN: 12.2 g/dL (ref 12.0–15.0)
IMMATURE GRANULOCYTES: 0 %
Lymphocytes Relative: 21 %
Lymphs Abs: 0.9 10*3/uL (ref 0.7–4.0)
MCH: 31.6 pg (ref 26.0–34.0)
MCHC: 33.8 g/dL (ref 30.0–36.0)
MCV: 93.5 fL (ref 80.0–100.0)
Monocytes Absolute: 0.3 10*3/uL (ref 0.1–1.0)
Monocytes Relative: 7 %
NEUTROS PCT: 66 %
NRBC: 0 % (ref 0.0–0.2)
Neutro Abs: 2.7 10*3/uL (ref 1.7–7.7)
Platelets: 142 10*3/uL — ABNORMAL LOW (ref 150–400)
RBC: 3.86 MIL/uL — AB (ref 3.87–5.11)
RDW: 11.5 % (ref 11.5–15.5)
WBC: 4.1 10*3/uL (ref 4.0–10.5)

## 2018-10-28 MED ORDER — SODIUM CHLORIDE 0.9% FLUSH
10.0000 mL | INTRAVENOUS | Status: DC | PRN
  Administered 2018-10-28: 10 mL
  Filled 2018-10-28: qty 10

## 2018-10-28 MED ORDER — DIPHENHYDRAMINE HCL 25 MG PO CAPS
50.0000 mg | ORAL_CAPSULE | Freq: Once | ORAL | Status: AC
  Administered 2018-10-28: 50 mg via ORAL

## 2018-10-28 MED ORDER — ACETAMINOPHEN 325 MG PO TABS
ORAL_TABLET | ORAL | Status: AC
  Filled 2018-10-28: qty 2

## 2018-10-28 MED ORDER — DIPHENHYDRAMINE HCL 25 MG PO CAPS
ORAL_CAPSULE | ORAL | Status: AC
  Filled 2018-10-28: qty 2

## 2018-10-28 MED ORDER — TRASTUZUMAB CHEMO 150 MG IV SOLR
6.0000 mg/kg | Freq: Once | INTRAVENOUS | Status: AC
  Administered 2018-10-28: 336 mg via INTRAVENOUS
  Filled 2018-10-28: qty 16

## 2018-10-28 MED ORDER — ACETAMINOPHEN 325 MG PO TABS
650.0000 mg | ORAL_TABLET | Freq: Once | ORAL | Status: AC
  Administered 2018-10-28: 650 mg via ORAL

## 2018-10-28 MED ORDER — SODIUM CHLORIDE 0.9 % IV SOLN
Freq: Once | INTRAVENOUS | Status: AC
  Administered 2018-10-28: 14:00:00 via INTRAVENOUS
  Filled 2018-10-28: qty 250

## 2018-10-28 MED ORDER — HEPARIN SOD (PORK) LOCK FLUSH 100 UNIT/ML IV SOLN
500.0000 [IU] | Freq: Once | INTRAVENOUS | Status: AC | PRN
  Administered 2018-10-28: 500 [IU]
  Filled 2018-10-28: qty 5

## 2018-10-28 NOTE — Patient Instructions (Signed)
[facility] Discharge Instructions for Patients Receiving Chemotherapy  Today you received the following chemotherapy agents Herceptin  To help prevent nausea and vomiting after your treatment, we encourage you to take your nausea medication as directed   If you develop nausea and vomiting that is not controlled by your nausea medication, call the clinic.   BELOW ARE SYMPTOMS THAT SHOULD BE REPORTED IMMEDIATELY:  *FEVER GREATER THAN 100.5 F  *CHILLS WITH OR WITHOUT FEVER  NAUSEA AND VOMITING THAT IS NOT CONTROLLED WITH YOUR NAUSEA MEDICATION  *UNUSUAL SHORTNESS OF BREATH  *UNUSUAL BRUISING OR BLEEDING  TENDERNESS IN MOUTH AND THROAT WITH OR WITHOUT PRESENCE OF ULCERS  *URINARY PROBLEMS  *BOWEL PROBLEMS  UNUSUAL RASH Items with * indicate a potential emergency and should be followed up as soon as possible.  Feel free to call the clinic should you have any questions or concerns. The clinic phone number is [phone].  Please show the CHEMO ALERT CARD at check-in to the Emergency Department and triage nurse.   

## 2018-10-29 ENCOUNTER — Ambulatory Visit
Admission: RE | Admit: 2018-10-29 | Discharge: 2018-10-29 | Disposition: A | Discharge status: Home / Self Care | Payer: BC Managed Care – PPO | Source: Ambulatory Visit | Attending: Radiation Oncology | Admitting: Radiation Oncology

## 2018-10-29 DIAGNOSIS — Z510 Encounter for antineoplastic radiation therapy: Secondary | ICD-10-CM | POA: Diagnosis not present

## 2018-10-29 DIAGNOSIS — C50512 Malignant neoplasm of lower-outer quadrant of left female breast: Secondary | ICD-10-CM | POA: Insufficient documentation

## 2018-10-29 DIAGNOSIS — Z171 Estrogen receptor negative status [ER-]: Secondary | ICD-10-CM | POA: Diagnosis not present

## 2018-11-01 ENCOUNTER — Ambulatory Visit
Admission: RE | Admit: 2018-11-01 | Discharge: 2018-11-01 | Disposition: A | Discharge status: Home / Self Care | Payer: BC Managed Care – PPO | Source: Ambulatory Visit | Attending: Radiation Oncology | Admitting: Radiation Oncology

## 2018-11-01 DIAGNOSIS — C50512 Malignant neoplasm of lower-outer quadrant of left female breast: Secondary | ICD-10-CM

## 2018-11-01 DIAGNOSIS — Z171 Estrogen receptor negative status [ER-]: Principal | ICD-10-CM

## 2018-11-01 MED ORDER — SONAFINE EX EMUL
1.0000 "application " | Freq: Two times a day (BID) | CUTANEOUS | Status: DC
  Administered 2018-11-01: 1 via TOPICAL

## 2018-11-01 MED ORDER — SILVER SULFADIAZINE 1 % EX CREA
TOPICAL_CREAM | Freq: Two times a day (BID) | CUTANEOUS | Status: DC
  Administered 2018-11-01: 17:00:00 via TOPICAL

## 2018-11-03 ENCOUNTER — Encounter: Payer: Self-pay | Admitting: Radiation Oncology

## 2018-11-03 NOTE — Progress Notes (Signed)
  Radiation Oncology         [phone]) [phone] ________________________________  Name: Monica Padilla MRN: [id number]  Date: 11/03/2018  DOB: 10-Jan-1966  End of Treatment Note  Diagnosis:   Pathologic stage: pT2, pN1a (sn), Left Breast, LOQ, Invasive Ductal Carcinoma, ER (-), PR (-), HER2 (+), grade 3     Indication for treatment:  Curative       Radiation treatment dates:   09/06/18-11/01/18  Site/dose:  1. Left Axilla, 1.8 Gy in 25 fractions for a total of 45 Gy          2. Left CW, 1.8 Gy in 28 fractions for a total of 50.4 Gy          3.  CW Boost, 2 Gy in 5 fractions for a total of 10 Gy  Beams/energy:   1. 3D, 10X, 6X         2. 3D, 6X         3. 3D  Narrative: The patient tolerated radiation treatment relatively well.   At the beginning of treatment, pt reported mild fatigue. Pt denied chills, fever, cough, and breathing problems throughout treatments. Towards the end of treatment, pt reported fatigue. Pt developed radiation related skin changes, including bright red and taut skin in the chest area. Her axilla became hyperpigmented. Patient was prescribed Radiaplex for radiation-related skin changes. She used Silvadene twice a day with Sonafine in between. Overall the pt was without complaints.   Plan: The patient has completed radiation treatment. The patient will return to radiation oncology clinic for routine followup in one month. I advised them to call or return sooner if they have any questions or concerns related to their recovery or treatment.  -----------------------------------  Blair Promise, PhD, MD  This document serves as a record of services personally performed by Gery Pray, MD. It was created on his behalf by Mary-Margaret Loma Messing, a trained medical scribe. The creation of this record is based on the scribe's personal observations and the provider's statements to them. This document has been checked and approved by the attending provider.

## 2018-11-04 ENCOUNTER — Telehealth: Payer: Self-pay

## 2018-11-04 NOTE — Telephone Encounter (Signed)
Pt VM stating that area in boost radiation field was now draining "like Dr. Sondra Come said it would but its yellow drainage". Pt reported area was still warm to touch and she was applying creams as directed by Dr. Sondra Come. Returned pt's VM after discussion with Dr. Sondra Come. Conveyed to pt that serous drainage was expected. Confirmed that there were no new symptoms since evaluated on Monday. Pt reported scant serous drainage was only new finding. Conveyed to pt that Dr. Sondra Come was aware and felt that pt could wait to be seen at one week f/u appt on Tuesday. Pt instructed to seek medical care if she would become febrile or drainage would become purulent. Pt verbalized understanding and agreement. Loma Sousa, RN BSN

## 2018-11-09 ENCOUNTER — Encounter: Payer: Self-pay | Admitting: Radiation Oncology

## 2018-11-09 ENCOUNTER — Other Ambulatory Visit: Payer: Self-pay

## 2018-11-09 ENCOUNTER — Ambulatory Visit
Admission: RE | Admit: 2018-11-09 | Discharge: 2018-11-09 | Disposition: A | Discharge status: Home / Self Care | Payer: BC Managed Care – PPO | Source: Ambulatory Visit | Attending: Radiation Oncology | Admitting: Radiation Oncology

## 2018-11-09 VITALS — BP 133/86 | HR 69 | Temp 98.1°F | Resp 18 | Wt 130.5 lb

## 2018-11-09 DIAGNOSIS — Z885 Allergy status to narcotic agent status: Secondary | ICD-10-CM | POA: Diagnosis not present

## 2018-11-09 DIAGNOSIS — Z880 Allergy status to penicillin: Secondary | ICD-10-CM | POA: Insufficient documentation

## 2018-11-09 DIAGNOSIS — Z923 Personal history of irradiation: Secondary | ICD-10-CM | POA: Insufficient documentation

## 2018-11-09 DIAGNOSIS — Z881 Allergy status to other antibiotic agents status: Secondary | ICD-10-CM | POA: Insufficient documentation

## 2018-11-09 DIAGNOSIS — Z79899 Other long term (current) drug therapy: Secondary | ICD-10-CM | POA: Insufficient documentation

## 2018-11-09 DIAGNOSIS — C50512 Malignant neoplasm of lower-outer quadrant of left female breast: Secondary | ICD-10-CM | POA: Insufficient documentation

## 2018-11-09 DIAGNOSIS — Z171 Estrogen receptor negative status [ER-]: Secondary | ICD-10-CM | POA: Insufficient documentation

## 2018-11-09 MED ORDER — SILVER SULFADIAZINE 1 % EX CREA
TOPICAL_CREAM | Freq: Two times a day (BID) | CUTANEOUS | Status: DC
  Administered 2018-11-09: 12:00:00 via TOPICAL

## 2018-11-09 NOTE — Progress Notes (Signed)
  Radiation Oncology         [phone] ________________________________  Name: Monica Padilla MRN: [id number]  Date: 11/09/2018  DOB: [birth year]  Follow-Up Visit Note  CC: Marylynn Pearson, MD  Magrinat, Virgie Dad, MD    ICD-10-CM   1. Malignant neoplasm of lower-outer quadrant of left breast of female, estrogen receptor negative ([place]) C50.512 silver sulfADIAZINE (SILVADENE) 1 % cream   Z17.1     Diagnosis:   Pathologic stage:pT2, pN1a (sn),LeftBreast, LOQ, Invasive Ductal Carcinoma, ER(-), PR(-), HER2(+), grade3    Interval Since Last Radiation:  8 days  Narrative:  The patient returns today for ) follow-up. Patient did develop moist desquamation in the treatment area and is seen on a regular basis until this heals.  She is having less discomfort in the left chest area. She denies any chills or fever.                          ALLERGIES:  is allergic to codeine; nitrofurantoin; amoxicillin; and pertuzumab.  Meds: Current Outpatient Medications  Medication Sig Dispense Refill  . gabapentin (NEURONTIN) 300 MG capsule Take 300 mg by mouth once.    . lidocaine-prilocaine (EMLA) cream Apply to affected area once 30 g 3  . LORazepam (ATIVAN) 0.5 MG tablet TAKE ONE TABLET DAILY AT BEDTIME AS NEEDED FOR ANXIETY 30 tablet 1  . Multiple Vitamins-Minerals (WOMENS MULTI VITAMIN & MINERAL) TABS Take 1 each by mouth.    . pantoprazole (PROTONIX) 40 MG tablet Take by mouth.     Current Facility-Administered Medications  Medication Dose Route Frequency Provider Last Rate Last Dose  . silver sulfADIAZINE (SILVADENE) 1 % cream   Topical BID Gery Pray, MD        Physical Findings: The patient is in no acute distress. Patient is alert and oriented.  weight is 130 lb 8 oz (59.2 kg). Her oral temperature is 98.1 F (36.7 C). Her blood pressure is 133/86 and her pulse is 69. Her respiration is 18 and oxygen saturation is 98%. .  The lungs are clear. The heart has a regular rhythm and  rate. The left chest wall area shows shows continued moist desquamation along the mastectomy scar but no signs of infection.  Lab Findings: Lab Results  Component Value Date   WBC 4.1 10/28/2018   HGB 12.2 10/28/2018   HCT 36.1 10/28/2018   MCV 93.5 10/28/2018   PLT 142 (L) 10/28/2018    Radiographic Findings: No results found.  Impression:  The patient is recovering from the effects of radiation.  The patient's skin is healing well but she continues to have some areas of moist desquamation. She will continue using antibiotic cream and will return for follow-up December 9.  Plan:  Follow-up on December 9. The patient will call if her skin the does not show signs of improvement over the next 7-10 days.  ____________________________________ Gery Pray, MD

## 2018-11-09 NOTE — Progress Notes (Signed)
Monica Padilla is here for a follow-up  Appointment. Patient denies any pain. Patient reports mild fatigue. Patient states that she is using her sonafine and silverdene cream as ordered. Patient states that she has some swelling in her breast area. Patient states that her breast area is hyperpigmented and tender. Vitals:   11/09/18 1053  BP: 133/86  Pulse: 69  Resp: 18  Temp: 98.1 F (36.7 C)  TempSrc: Oral  SpO2: 98%  Weight: 130 lb 8 oz (59.2 kg)   Wt Readings from Last 3 Encounters:  11/09/18 130 lb 8 oz (59.2 kg)  10/07/18 130 lb 8 oz (59.2 kg)  09/16/18 132 lb 9.6 oz (60.1 kg)

## 2018-11-10 ENCOUNTER — Ambulatory Visit (HOSPITAL_COMMUNITY)
Admission: RE | Admit: 2018-11-10 | Discharge: 2018-11-10 | Disposition: A | Discharge status: Home / Self Care | Payer: BC Managed Care – PPO | Source: Ambulatory Visit | Attending: Family Medicine | Admitting: Family Medicine

## 2018-11-10 ENCOUNTER — Ambulatory Visit (HOSPITAL_BASED_OUTPATIENT_CLINIC_OR_DEPARTMENT_OTHER)
Admission: RE | Admit: 2018-11-10 | Discharge: 2018-11-10 | Disposition: A | Discharge status: Home / Self Care | Payer: BC Managed Care – PPO | Source: Ambulatory Visit | Attending: Internal Medicine | Admitting: Internal Medicine

## 2018-11-10 VITALS — BP 122/78 | HR 78 | Wt 129.4 lb

## 2018-11-10 DIAGNOSIS — Z881 Allergy status to other antibiotic agents status: Secondary | ICD-10-CM | POA: Insufficient documentation

## 2018-11-10 DIAGNOSIS — Z79899 Other long term (current) drug therapy: Secondary | ICD-10-CM | POA: Insufficient documentation

## 2018-11-10 DIAGNOSIS — Z801 Family history of malignant neoplasm of trachea, bronchus and lung: Secondary | ICD-10-CM | POA: Diagnosis not present

## 2018-11-10 DIAGNOSIS — K219 Gastro-esophageal reflux disease without esophagitis: Secondary | ICD-10-CM | POA: Insufficient documentation

## 2018-11-10 DIAGNOSIS — Z880 Allergy status to penicillin: Secondary | ICD-10-CM | POA: Insufficient documentation

## 2018-11-10 DIAGNOSIS — C50512 Malignant neoplasm of lower-outer quadrant of left female breast: Secondary | ICD-10-CM | POA: Insufficient documentation

## 2018-11-10 DIAGNOSIS — Z885 Allergy status to narcotic agent status: Secondary | ICD-10-CM | POA: Diagnosis not present

## 2018-11-10 DIAGNOSIS — Z171 Estrogen receptor negative status [ER-]: Secondary | ICD-10-CM | POA: Diagnosis not present

## 2018-11-10 DIAGNOSIS — Z9012 Acquired absence of left breast and nipple: Secondary | ICD-10-CM | POA: Diagnosis not present

## 2018-11-10 DIAGNOSIS — C50912 Malignant neoplasm of unspecified site of left female breast: Secondary | ICD-10-CM | POA: Diagnosis present

## 2018-11-10 DIAGNOSIS — C3490 Malignant neoplasm of unspecified part of unspecified bronchus or lung: Secondary | ICD-10-CM | POA: Insufficient documentation

## 2018-11-10 DIAGNOSIS — Z800 Family history of malignant neoplasm of digestive organs: Secondary | ICD-10-CM | POA: Insufficient documentation

## 2018-11-10 NOTE — Addendum Note (Signed)
Encounter addended by: Marlise Eves, RN on: 11/10/2018 9:20 AM  Actions taken: Order list changed, Diagnosis association updated, Sign clinical note

## 2018-11-10 NOTE — Patient Instructions (Signed)
Your physician has requested that you have an echocardiogram. Echocardiography is a painless test that uses sound waves to create images of your heart. It provides your doctor with information about the size and shape of your heart and how well your heart's chambers and valves are working. This procedure takes approximately one hour. There are no restrictions for this procedure.  Follow up with Dr. Haroldine Laws in 3 months

## 2018-11-10 NOTE — Progress Notes (Signed)
Cardio-Oncology Clinic Consult Note   Referring Physician: Dr. Jana Hakim Primary Care: Teressa Lower, MD Primary Cardiologist: Dr. Haroldine Laws (New)  HPI:  Monica Padilla is a 51 y.o. female with past medical history of left breast cancer who has been referred by Dr. Jana Hakim to establish in the cardio-oncology clinic for monitoring of cardio-toxicity while undergoing chemotherapy.   Cancer Profile      03/17/18 Initial Diagnosis/Biopsy      ([id number]), invasive ductal carcinoma, grade 2, estrogen and progesterone receptor negative, with an MIB-1 of 80%, but HER-2 amplified with a signals ratio 5.51 and the number per cell 17.35.    04/02/18 Surgery     Left mastectomy and sentinel lymph node sampling, as well as right-sided port placement   Treatment Plan  (1) genetics testing 06/15/2018 (2) adjuvant chemotherapy consisting of carboplatin and docetaxel given every 21 days x 6, started 05/13/2018, completed 08/26/2018 (3) Trastuzumab and Pertuzumab started 05/13/2018; trastuzumab to be continued to complete a year             (a) epratuzumab discontinued after cycle 2 because of allergic reactions             (b) echocardiogram 05/06/2018 shows an ejection fraction in the 65-70%             (c) echocardiogram 08/12/2018 shows an ejection fraction in the 65-70% (4) adjuvant radiation in process (5) considering reconstruction eventually  She presents today to establish in the cardioncology clinic. Tolerating Herceptin well. Was unable to tolerate Perjeta. Now 7 months into therapy (Finishes in May 2020). No CP or SOB. No history of heart problems,   Echo today shows EF 60-65%, GLS -15.1 with poor windows (underestimated) - Personally reviewed    Echo 08/12/18 LVEF 65-70%, Grade 1 DD, Trivial MR, Trivial TR. GLS -22.6%, Lateral S' 10 cm/s.  Review of Systems: [y] = yes, '[ ]'  = no   . General: Weight gain '[ ]' ; Weight loss '[ ]' ; Anorexia '[ ]' ; Fatigue '[ ]' ; Fever '[ ]' ; Chills '[ ]' ; Weakness  '[ ]'   . Cardiac: Chest pain/pressure '[ ]' ; Resting SOB '[ ]' ; Exertional SOB '[ ]' ; Orthopnea '[ ]' ; Pedal Edema '[ ]' ; Palpitations '[ ]' ; Syncope '[ ]' ; Presyncope '[ ]' ; Paroxysmal nocturnal dyspnea'[ ]'   . Pulmonary: Cough '[ ]' ; Wheezing'[ ]' ; Hemoptysis'[ ]' ; Sputum '[ ]' ; Snoring '[ ]'   . GI: Vomiting'[ ]' ; Dysphagia'[ ]' ; Melena'[ ]' ; Hematochezia '[ ]' ; Heartburn'[ ]' ; Abdominal pain '[ ]' ; Constipation '[ ]' ; Diarrhea '[ ]' ; BRBPR '[ ]'   . GU: Hematuria'[ ]' ; Dysuria '[ ]' ; Nocturia'[ ]'   . Vascular: Pain in legs with walking '[ ]' ; Pain in feet with lying flat '[ ]' ; Non-healing sores '[ ]' ; Stroke '[ ]' ; TIA '[ ]' ; Slurred speech '[ ]' ;  . Neuro: Headaches'[ ]' ; Vertigo'[ ]' ; Seizures'[ ]' ; Paresthesias'[ ]' ;Blurred vision '[ ]' ; Diplopia '[ ]' ; Vision changes '[ ]'   . Ortho/Skin: Arthritis '[ ]' ; Joint pain '[ ]' ; Muscle pain '[ ]' ; Joint swelling '[ ]' ; Back Pain '[ ]' ; Rash '[ ]'   . Psych: Depression'[ ]' ; Anxiety'[ ]'   . Heme: Bleeding problems '[ ]' ; Clotting disorders '[ ]' ; Anemia '[ ]'   . Endocrine: Diabetes '[ ]' ; Thyroid dysfunction'[ ]'    Past Medical History:  Diagnosis Date  . Breast cancer ([place])   . Cancer (HCC)    basal cell CA- chest, leg  . Family history of breast cancer   . Family history of colon cancer   . Family history of lung  cancer   . GERD (gastroesophageal reflux disease)     Current Outpatient Medications  Medication Sig Dispense Refill  . gabapentin (NEURONTIN) 300 MG capsule Take 300 mg by mouth once.    . lidocaine-prilocaine (EMLA) cream Apply to affected area once 30 g 3  . LORazepam (ATIVAN) 0.5 MG tablet TAKE ONE TABLET DAILY AT BEDTIME AS NEEDED FOR ANXIETY 30 tablet 1  . Multiple Vitamins-Minerals (WOMENS MULTI VITAMIN & MINERAL) TABS Take 1 each by mouth.    . pantoprazole (PROTONIX) 40 MG tablet Take by mouth. As needed     No current facility-administered medications for this encounter.    Allergies  Allergen Reactions  . Codeine Itching  . Nitrofurantoin     REACTION: pruritis  . Amoxicillin Rash  . Pertuzumab Rash        Social History   Socioeconomic History  . Marital status: Married    Spouse name: Not on file  . Number of children: Not on file  . Years of education: Not on file  . Highest education level: Not on file  Occupational History  . Not on file  Social Needs  . Financial resource strain: Not on file  . Food insecurity:    Worry: Not on file    Inability: Not on file  . Transportation needs:    Medical: Not on file    Non-medical: Not on file  Tobacco Use  . Smoking status: Never Smoker  . Smokeless tobacco: Never Used  Substance and Sexual Activity  . Alcohol use: Yes    Comment: occasional  . Drug use: No  . Sexual activity: Not on file  Lifestyle  . Physical activity:    Days per week: Not on file    Minutes per session: Not on file  . Stress: Not on file  Relationships  . Social connections:    Talks on phone: Not on file    Gets together: Not on file    Attends religious service: Not on file    Active member of club or organization: Not on file    Attends meetings of clubs or organizations: Not on file    Relationship status: Not on file  . Intimate partner violence:    Fear of current or ex partner: Not on file    Emotionally abused: Not on file    Physically abused: Not on file    Forced sexual activity: Not on file  Other Topics Concern  . Not on file  Social History Narrative  . Not on file     Family History  Problem Relation Age of Onset  . Colon cancer Father 66  . Basal cell carcinoma Sister        2-3  . Lung cancer Maternal Aunt        hx smoking  . Lung cancer Paternal Uncle        hx smoking  . Heart disease Maternal Grandmother   . Heart disease Maternal Grandfather   . Cancer Paternal Grandfather        unk if colon or stomach? died in early 28's.   . Esophageal cancer Neg Hx   . Stomach cancer Neg Hx   . Rectal cancer Neg Hx    Vitals:   11/10/18 0845  BP: 122/78  Pulse: 78  SpO2: 98%  Weight: 58.7 kg (129 lb 6.4 oz)    PHYSICAL EXAM: General:  Well appearing. No respiratory difficulty HEENT: normal Neck: supple. RIJ porta-cath. Carotids 2+  bilat; no bruits. No lymphadenopathy or thyromegaly appreciated. Cor: PMI nondisplaced. Regular rate & rhythm. No rubs, gallops or murmurs. Lungs: clear Abdomen: soft, nontender, nondistended. No hepatosplenomegaly. No bruits or masses. Good bowel sounds. Extremities: no cyanosis, clubbing, rash, edema Neuro: alert & oriented x 3, cranial nerves grossly intact. moves all 4 extremities w/o difficulty. Affect pleasant.  ASSESSMENT & PLAN:  1. Estrogen receptor negative, HER-2 positive LEFT breast cancer - Trastuzumab(Herceptin) and Pertuzumab started 05/13/2018; trastuzumab to be continued to complete a year. Pertuzamab discontinued after cycle 2 because of allergic reactions - Explained incidence of Herceptin cardiotoxicity and role of Cardio-oncology clinic at length. Echo images reviewed personally. All parameters stable. Reviewed signs and symptoms of HF to look for. Continue Herceptin. Follow-up with echo in 3 months.  Glori Bickers, MD  9:14 AM

## 2018-11-10 NOTE — Progress Notes (Signed)
  Echocardiogram 2D Echocardiogram has been performed.  Karren Newland G Merrianne Mccumbers 11/10/2018, 8:56 AM

## 2018-11-18 ENCOUNTER — Inpatient Hospital Stay: Payer: BC Managed Care – PPO

## 2018-11-18 ENCOUNTER — Inpatient Hospital Stay: Payer: BC Managed Care – PPO | Attending: Oncology

## 2018-11-18 ENCOUNTER — Telehealth: Payer: Self-pay | Admitting: Oncology

## 2018-11-18 ENCOUNTER — Inpatient Hospital Stay (HOSPITAL_BASED_OUTPATIENT_CLINIC_OR_DEPARTMENT_OTHER): Payer: BC Managed Care – PPO | Admitting: Oncology

## 2018-11-18 VITALS — BP 148/90 | HR 73 | Temp 98.0°F | Resp 18 | Ht 61.0 in | Wt 128.9 lb

## 2018-11-18 DIAGNOSIS — Z5112 Encounter for antineoplastic immunotherapy: Secondary | ICD-10-CM | POA: Diagnosis present

## 2018-11-18 DIAGNOSIS — Z171 Estrogen receptor negative status [ER-]: Secondary | ICD-10-CM

## 2018-11-18 DIAGNOSIS — C50512 Malignant neoplasm of lower-outer quadrant of left female breast: Secondary | ICD-10-CM

## 2018-11-18 DIAGNOSIS — Z803 Family history of malignant neoplasm of breast: Secondary | ICD-10-CM | POA: Diagnosis not present

## 2018-11-18 DIAGNOSIS — Z9012 Acquired absence of left breast and nipple: Secondary | ICD-10-CM

## 2018-11-18 DIAGNOSIS — Z800 Family history of malignant neoplasm of digestive organs: Secondary | ICD-10-CM

## 2018-11-18 DIAGNOSIS — Z95828 Presence of other vascular implants and grafts: Secondary | ICD-10-CM

## 2018-11-18 DIAGNOSIS — Z801 Family history of malignant neoplasm of trachea, bronchus and lung: Secondary | ICD-10-CM

## 2018-11-18 LAB — COMPREHENSIVE METABOLIC PANEL
ALK PHOS: 104 U/L (ref 38–126)
ALT: 28 U/L (ref 0–44)
AST: 24 U/L (ref 15–41)
Albumin: 4.1 g/dL (ref 3.5–5.0)
Anion gap: 10 (ref 5–15)
BILIRUBIN TOTAL: 0.5 mg/dL (ref 0.3–1.2)
BUN: 10 mg/dL (ref 6–20)
CALCIUM: 10.1 mg/dL (ref 8.9–10.3)
CHLORIDE: 105 mmol/L (ref 98–111)
CO2: 28 mmol/L (ref 22–32)
Creatinine, Ser: 0.7 mg/dL (ref 0.44–1.00)
GFR calc non Af Amer: >60 mL/min (ref >60)
Glucose, Bld: 86 mg/dL (ref 70–99)
Potassium: 4 mmol/L (ref 3.5–5.1)
Sodium: 143 mmol/L (ref 135–145)
TOTAL PROTEIN: 7 g/dL (ref 6.5–8.1)

## 2018-11-18 LAB — CBC WITH DIFFERENTIAL/PLATELET
ABS IMMATURE GRANULOCYTES: 0.01 10*3/uL (ref 0.00–0.07)
BASOS PCT: 1 %
Basophils Absolute: 0 10*3/uL (ref 0.0–0.1)
Eosinophils Absolute: 0.1 10*3/uL (ref 0.0–0.5)
Eosinophils Relative: 3 %
HEMATOCRIT: 37 % (ref 36.0–46.0)
HEMOGLOBIN: 12.7 g/dL (ref 12.0–15.0)
IMMATURE GRANULOCYTES: 0 %
LYMPHS ABS: 1 10*3/uL (ref 0.7–4.0)
Lymphocytes Relative: 25 %
MCH: 31.4 pg (ref 26.0–34.0)
MCHC: 34.3 g/dL (ref 30.0–36.0)
MCV: 91.6 fL (ref 80.0–100.0)
MONO ABS: 0.4 10*3/uL (ref 0.1–1.0)
MONOS PCT: 10 %
NEUTROS ABS: 2.4 10*3/uL (ref 1.7–7.7)
NEUTROS PCT: 61 %
PLATELETS: 161 10*3/uL (ref 150–400)
RBC: 4.04 MIL/uL (ref 3.87–5.11)
RDW: 11.5 % (ref 11.5–15.5)
WBC: 3.9 10*3/uL — ABNORMAL LOW (ref 4.0–10.5)
nRBC: 0 % (ref 0.0–0.2)

## 2018-11-18 MED ORDER — SODIUM CHLORIDE 0.9 % IV SOLN
Freq: Once | INTRAVENOUS | Status: AC
  Administered 2018-11-18: 14:00:00 via INTRAVENOUS
  Filled 2018-11-18: qty 250

## 2018-11-18 MED ORDER — DIPHENHYDRAMINE HCL 25 MG PO CAPS
50.0000 mg | ORAL_CAPSULE | Freq: Once | ORAL | Status: AC
  Administered 2018-11-18: 50 mg via ORAL

## 2018-11-18 MED ORDER — HEPARIN SOD (PORK) LOCK FLUSH 100 UNIT/ML IV SOLN
500.0000 [IU] | Freq: Once | INTRAVENOUS | Status: AC | PRN
  Administered 2018-11-18: 500 [IU]
  Filled 2018-11-18: qty 5

## 2018-11-18 MED ORDER — ACETAMINOPHEN 325 MG PO TABS
ORAL_TABLET | ORAL | Status: AC
  Filled 2018-11-18: qty 2

## 2018-11-18 MED ORDER — TRASTUZUMAB CHEMO 150 MG IV SOLR
6.0000 mg/kg | Freq: Once | INTRAVENOUS | Status: AC
  Administered 2018-11-18: 336 mg via INTRAVENOUS
  Filled 2018-11-18: qty 16

## 2018-11-18 MED ORDER — SODIUM CHLORIDE 0.9% FLUSH
10.0000 mL | INTRAVENOUS | Status: DC | PRN
  Administered 2018-11-18: 10 mL
  Filled 2018-11-18: qty 10

## 2018-11-18 MED ORDER — DIPHENHYDRAMINE HCL 25 MG PO CAPS
ORAL_CAPSULE | ORAL | Status: AC
  Filled 2018-11-18: qty 2

## 2018-11-18 MED ORDER — ACETAMINOPHEN 325 MG PO TABS
650.0000 mg | ORAL_TABLET | Freq: Once | ORAL | Status: AC
  Administered 2018-11-18: 650 mg via ORAL

## 2018-11-18 NOTE — Patient Instructions (Signed)
[facility] Discharge Instructions for Patients Receiving Chemotherapy  Today you received the following chemotherapy agents Herceptin.  To help prevent nausea and vomiting after your treatment, we encourage you to take your nausea medication.   If you develop nausea and vomiting that is not controlled by your nausea medication, call the clinic.   BELOW ARE SYMPTOMS THAT SHOULD BE REPORTED IMMEDIATELY:  *FEVER GREATER THAN 100.5 F  *CHILLS WITH OR WITHOUT FEVER  NAUSEA AND VOMITING THAT IS NOT CONTROLLED WITH YOUR NAUSEA MEDICATION  *UNUSUAL SHORTNESS OF BREATH  *UNUSUAL BRUISING OR BLEEDING  TENDERNESS IN MOUTH AND THROAT WITH OR WITHOUT PRESENCE OF ULCERS  *URINARY PROBLEMS  *BOWEL PROBLEMS  UNUSUAL RASH Items with * indicate a potential emergency and should be followed up as soon as possible.  Feel free to call the clinic should you have any questions or concerns. The clinic phone number is [phone].  Please show the CHEMO ALERT CARD at check-in to the Emergency Department and triage nurse.   

## 2018-11-18 NOTE — Progress Notes (Signed)
[facility]  [phone] [phone]     ID: Monica Padilla DOB: [birth year]  MR#: [id number]  HMC#:[id number]  Patient Care Team: Marylynn Pearson, MD as PCP - General (Obstetrics and Gynecology) Signe Colt, MD as Referring Physician (Obstetrics and Gynecology) Magrinat, Virgie Dad, MD as Consulting Physician (Oncology) Larey Dresser, MD as Consulting Physician (Cardiology) Noberto Retort Juanda Bond., MD as Referring Physician (Surgery) Gatha Mayer, MD as Consulting Physician (Gastroenterology) Nolon Nations, MD as Consulting Physician (Diagnostic Radiology) OTHER MD: Dr. Jimmye Norman and Dr. Michele Mcalpine at [facility]: Estrogen receptor negative, HER-2 positive breast cancer  CURRENT TREATMENT: trastuzumab  INTERVAL HISTORY: Monica Padilla returns today for follow-up of her estrogen receptor negative, HER-2 positive breast cancer.  The patient continues on trastuzumab, and is tolerating it well.   Echocardiogram on 11/10/2018, showed an ejection fraction in the 60% - 65% range.   REVIEW OF SYSTEMS: Monica Padilla is well overall. She had peeling from chemotherapy, but her skin has currently recovered well. Her energy was low to moderate, but she continued to work through all her treatments and was able to do things she enjoyed. For exercise she usually walks, but has been unable to do so recently. She wants to get back into exercise at the gym for things such as spin-class. The patient denies unusual headaches, visual changes, nausea, vomiting, or dizziness. There has been no unusual cough, phlegm production, or pleurisy. This been no change in bowel or bladder habits. The patient denies unexplained weight loss, bleeding, rash, or fever. A detailed review of systems was otherwise noncontributory.    HISTORY OF CURRENT ILLNESS: From the original intake note:  The patient noted a palpable mass in her left breast as she was laying down.  She brought this to medical attention immediately and had left diagnostic mammography with tomography at breast center  03/11/2018 showing a 2.5 cm palpable mass in the lower outer quadrant of the left breast, with one slightly prominent lymph node that had increased cortical measurement.  Biopsy of this left breast mass 03/17/2018 showed ([id number] [id number]), invasive ductal carcinoma, grade 2, estrogen and progesterone receptor negative, with an MIB-1 of 80%, but HER-2 amplified with a signals ratio 5.51 and the number per cell 17.35.  She underwent left mastectomy and sentinel lymph node sampling, as well as right-sided port placement 04/02/2018 under Etheleen Mayhew at [facility] for a 2.6 cm invasive ductal carcinoma, grade 3, with 1 of 3 sentinel nodes showing a micrometastatic deposit, but all margins negative.  She had a port placed at the same time.  The patient's subsequent history is as detailed below.    PAST MEDICAL HISTORY: Past Medical History:  Diagnosis Date  . Breast cancer ([place])   . Cancer (HCC)    basal cell CA- chest, leg  . Family history of breast cancer   . Family history of colon cancer   . Family history of lung cancer   . GERD (gastroesophageal reflux disease)   s.   PAST SURGICAL HISTORY: Past Surgical History:  Procedure Laterality Date  . PARTIAL HYSTERECTOMY    . SKIN CANCER EXCISION      FAMILY HISTORY Family History  Problem Relation Age of Onset  . Colon cancer Father 24  . Basal cell carcinoma Sister        2-3  . Lung cancer Maternal Aunt        hx smoking  . Lung cancer Paternal Uncle  hx smoking  . Heart disease Maternal Grandmother   . Heart disease Maternal Grandfather   . Cancer Paternal Grandfather        unk if colon or stomach? died in early 45's.   . Esophageal cancer Neg Hx   . Stomach cancer Neg Hx   . Rectal cancer Neg Hx    The patient's father died due to colon cancer at age 41. The patient's mother is alive at age 41. The  patient has 1 sister and no brothers. A paternal cousin was diagnosed with breast cancer in the early 39's (before 24). The patient's paternal grandfather passed away from colon cancer. The patient's father had 3 brothers, one of whom had lung cancer.   GYNECOLOGIC HISTORY:  No LMP recorded. Patient has had a hysterectomy. Menarche: 52 years old Age at first live birth: 52 years old She is GXP2.  The patient is status post partial hysterectomy due to uterine fibroids. She retains her cervix and ovaries.  After her hysterectomy she used Estradiol with no complications.  She stopped hormone replacement March 2019.  She is now having hot flashes and nigh sweats that only allow her to sleep 3 hours at a time.   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 Her husband, [name], is a Hydrologist for alarms and cameras for [employer]. The patient's oldest son, [name], is 67 and works as a Civil engineer, contracting in [place]. The patient's son, [name], is 43 an works for a Dispensing optician. Monica Padilla lives at home with the patient. Gabrial does not have any grandchildren. She attends [employer].     ADVANCED DIRECTIVES:    HEALTH MAINTENANCE: Social History   Tobacco Use  . Smoking status: Never Smoker  . Smokeless tobacco: Never Used  Substance Use Topics  . Alcohol use: Yes    Comment: occasional  . Drug use: No     Colonoscopy: 10/04/2013/ Dr. Carlean Purl / polyp removal  PAP:  Bone density: remote   Allergies  Allergen Reactions  . Codeine Itching  . Nitrofurantoin     REACTION: pruritis  . Amoxicillin Rash  . Pertuzumab Rash    Current Outpatient Medications  Medication Sig Dispense Refill  . gabapentin (NEURONTIN) 300 MG capsule Take 300 mg by mouth once.    . lidocaine-prilocaine (EMLA) cream Apply to affected area once 30 g 3  . LORazepam (ATIVAN) 0.5 MG tablet TAKE ONE TABLET DAILY AT BEDTIME AS NEEDED FOR ANXIETY 30 tablet 1  .  Multiple Vitamins-Minerals (WOMENS MULTI VITAMIN & MINERAL) TABS Take 1 each by mouth.    . pantoprazole (PROTONIX) 40 MG tablet Take by mouth. As needed     No current facility-administered medications for this visit.     OBJECTIVE: Young appearing white woman in no acute distress  Vitals:   11/18/18 1216  BP: (!) 148/90  Pulse: 73  Resp: 18  Temp: 98 F (36.7 C)  SpO2: 100%     Body mass index is 24.36 kg/m.   Wt Readings from Last 3 Encounters:  11/18/18 128 lb 14.4 oz (58.5 kg)  11/10/18 129 lb 6.4 oz (58.7 kg)  11/09/18 130 lb 8 oz (59.2 kg)      ECOG FS:1  Sclerae unicteric, pupils round and equal Hair is coming in nicely, with no bald spots No cervical or supraclavicular adenopathy Lungs no rales or rhonchi Heart regular rate and rhythm Abd soft, nontender, positive bowel sounds MSK  no focal spinal tenderness, no left upper extremity lymphedema Neuro: nonfocal, well oriented, appropriate affect Breasts: The right breast is benign.  The left breast has undergone mastectomy and radiation.  There is still some erythema, but there is no desquamation.  The incision is healing very nicely.  Both axillae are benign.  LAB RESULTS:  CMP     Component Value Date/Time   NA 143 11/18/2018 1216   K 4.0 11/18/2018 1216   CL 105 11/18/2018 1216   CO2 28 11/18/2018 1216   GLUCOSE 86 11/18/2018 1216   BUN 10 11/18/2018 1216   CREATININE 0.70 11/18/2018 1216   CALCIUM 10.1 11/18/2018 1216   PROT 7.0 11/18/2018 1216   ALBUMIN 4.1 11/18/2018 1216   AST 24 11/18/2018 1216   ALT 28 11/18/2018 1216   ALKPHOS 104 11/18/2018 1216   BILITOT 0.5 11/18/2018 1216   GFRNONAA >60 11/18/2018 1216   GFRAA >60 11/18/2018 1216    No results found for: TOTALPROTELP, ALBUMINELP, A1GS, A2GS, BETS, BETA2SER, GAMS, MSPIKE, SPEI  No results found for: KPAFRELGTCHN, LAMBDASER, KAPLAMBRATIO  Lab Results  Component Value Date   WBC 3.9 (L) 11/18/2018   NEUTROABS 2.4 11/18/2018   HGB  12.7 11/18/2018   HCT 37.0 11/18/2018   MCV 91.6 11/18/2018   PLT 161 11/18/2018    _0 @  No results found for: LABCA2  No components found for: HWKGSU110  No results for input(s): INR in the last 168 hours.  No results found for: LABCA2  No results found for: RPR945  No results found for: OPF292  No results found for: KMQ286  No results found for: CA2729  No components found for: HGQUANT  No results found for: CEA1 / No results found for: CEA1   No results found for: AFPTUMOR  No results found for: [place]  No results found for: PSA1  Appointment on 11/18/2018  Component Date Value Ref Range Status  . WBC 11/18/2018 3.9* 4.0 - 10.5 K/uL Final  . RBC 11/18/2018 4.04  3.87 - 5.11 MIL/uL Final  . Hemoglobin 11/18/2018 12.7  12.0 - 15.0 g/dL Final  . HCT 11/18/2018 37.0  36.0 - 46.0 % Final  . MCV 11/18/2018 91.6  80.0 - 100.0 fL Final  . MCH 11/18/2018 31.4  26.0 - 34.0 pg Final  . MCHC 11/18/2018 34.3  30.0 - 36.0 g/dL Final  . RDW 11/18/2018 11.5  11.5 - 15.5 % Final  . Platelets 11/18/2018 161  150 - 400 K/uL Final  . nRBC 11/18/2018 0.0  0.0 - 0.2 % Final  . Neutrophils Relative % 11/18/2018 61  % Final  . Neutro Abs 11/18/2018 2.4  1.7 - 7.7 K/uL Final  . Lymphocytes Relative 11/18/2018 25  % Final  . Lymphs Abs 11/18/2018 1.0  0.7 - 4.0 K/uL Final  . Monocytes Relative 11/18/2018 10  % Final  . Monocytes Absolute 11/18/2018 0.4  0.1 - 1.0 K/uL Final  . Eosinophils Relative 11/18/2018 3  % Final  . Eosinophils Absolute 11/18/2018 0.1  0.0 - 0.5 K/uL Final  . Basophils Relative 11/18/2018 1  % Final  . Basophils Absolute 11/18/2018 0.0  0.0 - 0.1 K/uL Final  . Immature Granulocytes 11/18/2018 0  % Final  . Abs Immature Granulocytes 11/18/2018 0.01  0.00 - 0.07 K/uL Final   Performed at [facility], [place] [address], [place], [place] [address]  . Sodium 11/18/2018 143  135 - 145 mmol/L Final  . Potassium 11/18/2018 4.0  3.5 - 5.1 mmol/L Final  . Chloride 11/18/2018 105  98 - 111 mmol/L Final  . CO2 11/18/2018 28  22 - 32 mmol/L Final  . Glucose, Bld 11/18/2018 86  70 - 99 mg/dL Final  . BUN 11/18/2018 10  6 - 20 mg/dL Final  . Creatinine, Ser 11/18/2018 0.70  0.44 - 1.00 mg/dL Final  . Calcium 11/18/2018 10.1  8.9 - 10.3 mg/dL Final  . Total Protein 11/18/2018 7.0  6.5 - 8.1 g/dL Final  . Albumin 11/18/2018 4.1  3.5 - 5.0 g/dL Final  . AST 11/18/2018 24  15 - 41 U/L Final  . ALT 11/18/2018 28  0 - 44 U/L Final  . Alkaline Phosphatase 11/18/2018 104  38 - 126 U/L Final  . Total Bilirubin 11/18/2018 0.5  0.3 - 1.2 mg/dL Final  . GFR calc non Af Amer 11/18/2018 >60  >60 mL/min Final  . GFR calc Af Amer 11/18/2018 >60  >60 mL/min Final   Comment: (NOTE) The eGFR has been calculated using the CKD EPI equation. This calculation has not been validated in all clinical situations. eGFR's persistently <60 mL/min signify possible Chronic Kidney Disease.   Georgiann Hahn gap 11/18/2018 10  5 - 15 Final   Performed at [facility], [place] [address] [place], [place] [address]    (this displays the last labs from the last 3 days)  No results found for: TOTALPROTELP, ALBUMINELP, A1GS, A2GS, BETS, BETA2SER, GAMS, MSPIKE, SPEI (this displays SPEP labs)  No results found for: KPAFRELGTCHN, LAMBDASER, KAPLAMBRATIO (kappa/lambda light chains)  No results found for: HGBA, HGBA2QUANT, HGBFQUANT, HGBSQUAN (Hemoglobinopathy evaluation)   No results found for: LDH  No results found for: IRON, TIBC, IRONPCTSAT (Iron and TIBC)  No results found for: FERRITIN  Urinalysis No results found for: COLORURINE, APPEARANCEUR, LABSPEC, PHURINE, GLUCOSEU, HGBUR, BILIRUBINUR, KETONESUR, PROTEINUR, UROBILINOGEN, NITRITE, LEUKOCYTESUR   STUDIES: Echocardiogram on 11/10/2018, showed an ejection fraction in the 60% - 65% range.    ELIGIBLE FOR AVAILABLE RESEARCH PROTOCOL: not discussed  ASSESSMENT: 52 y.o.  [place], [place] woman status post left mastectomy and sentinel lymph node sampling 04/02/2018 for a pT2 pN1, stage IIB invasive ductal carcinoma, grade 3, estrogen and progesterone receptor negative, but HER-2 amplified  (1) genetics testing 07/14/2018 through [facility] found no deleterious mutations in APC, ATM, AXIN2, BAP1, BARD1, BLM, BMPR1A, BRCA1, BRCA2, BRIP1, BUB1B, CDC73, CDH1, CDK4, CDKN1C, CDKN2A (p14ARF), CDKN2A (p16INK4a), CEP57, CHEK2, CTNNA1, DICER1, DIS3L2, ENG, EPCAM*, FH, FLCN, GALNT12, GPC3, GREM1*, KIT, MEN1, MET, MLH1, MLH3, MSH2, MSH3, MSH6, MUTYH, NBN, NF1, PALB2, PDGFRA, PMS2, POLD1, POLE, PTEN, RAD50, RAD51C, RAD51D, RPS20, SDHB, SDHC, SDHD, SMAD4, SMARCA4, SMARCB1, STK11, TP53, TSC1, TSC2, VHL, WT1. The following genes were evaluated for sequence changes only: EGFR*, HOXB13*, NTHL1*, SDHA  (a) A variant of uncertain significance in [facility] was identified [id number] ([id number]  (2) adjuvant chemotherapy consisting of carboplatin and docetaxel given every 21 days x 6, started 05/13/2018, completed 08/26/2018  (3) trastuzumab and Pertuzumab started 05/13/2018; trastuzumab to be continued to complete a year  (a) pertuzumab discontinued after cycle 2 because of allergic reactions  (b) echocardiogram 05/06/2018 shows an ejection fraction in the 65-70%  (c) echocardiogram 08/12/2018 shows an ejection fraction in the 65-70%  (d) echocardiogram 11/10/2018 shows an ejection fraction in the 60-65% range  (4) adjuvant radiation 09/06/18-11/01/18 Site/dose:  1. Left Axilla, 1.8 Gy in 25 fractions for a total of 45 Gy  2. Left CW, 1.8 Gy in 28 fractions for a total of 50.4 Gy                     3. Boost, 2 Gy in 5 fractions for a total of 10 Gy  (5) considering reconstruction eventually    PLAN: Monica Padilla is now 7 months out from definitive surgery for her breast cancer with no evidence of disease recurrence.  This is very favorable.  She continues to tolerate the  trastuzumab well and the plan is to continue that through May.  She had many questions today.  She wanted to know if she could get her teeth cleaned and the flu shot and the answer to both those is yes.  We talked about exercise and I encouraged her to go ahead and get going with her old regimen if she could.  We reviewed infection precautions.  We reviewed the fact that she no longer needs to use any barriers during intimacy.  We discussed the various types of reconstruction that are available.  She is leaning towards an expander and implant reconstruction as opposed to a flap or the D IEP reconstruction.  We can continue to discuss that at future visits.  She will not of course be able to even start on these options until a minimum of 6 weeks after radiation.    She tells me her Pap smear came back atypical last August.  Chemotherapy certainly can do that.  I would simply wait for the next 1, which should be normal.  She would like to have her colonoscopy done before the end of the year and I have sent a note to Dr. Arelia Longest with that request  Otherwise she will continue to receive the Herceptin every 3 weeks and she will return to see me in March, after her next echo.  She knows to call for any other issues that may develop before that visit.   Magrinat, Virgie Dad, MD  11/18/18 1:12 PM Medical Oncology and Hematology [facility] [address] [place], [place] [address] Tel. [phone]    Fax. [phone]     I, Jacqualyn Posey, am acting as a Education administrator for Chauncey Cruel, MD.   I, Lurline Del MD, have reviewed the above documentation for accuracy and completeness, and I agree with the above.

## 2018-11-18 NOTE — Telephone Encounter (Signed)
Patient uses my chart and did not need avs and calendar.  No appt seen on schedule for 12/20; therefore, did not cancel.  Appt already on sch for 1/2.  Added 03/05 appts.

## 2018-11-19 ENCOUNTER — Telehealth: Payer: Self-pay | Admitting: Internal Medicine

## 2018-11-19 NOTE — Telephone Encounter (Signed)
-----   Message from Gatha Mayer, MD sent at 11/19/2018 12:42 PM EST ----- We will get this done - my staff will call her and set up a nurse visit and a colonoscopy appointment before the end of the year  Glendell Docker ----- Message ----- From: Chauncey Cruel, MD Sent: 11/18/2018   1:10 PM EST To: Gatha Mayer, MD  [facility] Glendell Docker! Mercer is done w chemo and would love to have her colonoscopy before the end of the year  Thanks!  Happy Thanksgivings  Gus

## 2018-11-19 NOTE — Telephone Encounter (Signed)
Left message for patient to return my call to schedule pre visit and recall colon with Dr. Carlean Purl

## 2018-11-22 ENCOUNTER — Encounter: Payer: Self-pay | Admitting: Internal Medicine

## 2018-12-02 ENCOUNTER — Ambulatory Visit: Payer: Self-pay | Admitting: Radiation Oncology

## 2018-12-06 ENCOUNTER — Encounter: Payer: Self-pay | Admitting: Radiation Oncology

## 2018-12-06 ENCOUNTER — Other Ambulatory Visit: Payer: Self-pay

## 2018-12-06 ENCOUNTER — Ambulatory Visit
Admission: RE | Admit: 2018-12-06 | Discharge: 2018-12-06 | Disposition: A | Discharge status: Home / Self Care | Payer: BC Managed Care – PPO | Source: Ambulatory Visit | Attending: Radiation Oncology | Admitting: Radiation Oncology

## 2018-12-06 VITALS — BP 128/80 | HR 72 | Temp 98.1°F | Resp 18 | Ht 61.0 in | Wt 129.0 lb

## 2018-12-06 DIAGNOSIS — Z923 Personal history of irradiation: Secondary | ICD-10-CM | POA: Diagnosis not present

## 2018-12-06 DIAGNOSIS — Z79899 Other long term (current) drug therapy: Secondary | ICD-10-CM | POA: Insufficient documentation

## 2018-12-06 DIAGNOSIS — C50512 Malignant neoplasm of lower-outer quadrant of left female breast: Secondary | ICD-10-CM | POA: Diagnosis not present

## 2018-12-06 DIAGNOSIS — Z171 Estrogen receptor negative status [ER-]: Secondary | ICD-10-CM | POA: Insufficient documentation

## 2018-12-06 NOTE — Progress Notes (Signed)
Radiation Oncology         [phone] ________________________________  Name: Monica Padilla MRN: [id number]  Date: 12/06/2018  DOB: 1966-12-13  Follow-Up Visit Note  CC: Marylynn Pearson, MD  Magrinat, Virgie Dad, MD    ICD-10-CM   1. Malignant neoplasm of lower-outer quadrant of left breast of female, estrogen receptor negative ([place]) C50.512    Z17.1     Diagnosis: Pathologic stage:pT2, pN1a (sn),LeftBreast, LOQ, Invasive Ductal Carcinoma, ER(-), PR(-), HER2(+), grade3     Interval Since Last Radiation:  1 months  Radiation treatment dates:   09/06/18-11/01/18  Site/dose:  1. Left Axilla, 1.8 Gy in 25 fractions for a total of 45 Gy                     2. Left CW, 1.8 Gy in 28 fractions for a total of 50.4 Gy                     3. Mastectomy scar Boost, 2 Gy in 5 fractions for a total of 10 Gy   Narrative:  The patient returns today for routine follow-up.  she is doing well overall. They are unaccompanied. She is taking IV Herceptin, and is having no side effects. She went to [facility] recently for the holidays and did a lot of walking, which she tolerated well.                 On review of systems, she reports improved fatigue. she denies itching, swelling, tightness in her axilla and any other symptoms. Pertinent positives are listed and detailed within the above HPI.                 ALLERGIES:  is allergic to codeine; nitrofurantoin; amoxicillin; and pertuzumab.  Meds: Current Outpatient Medications  Medication Sig Dispense Refill  . Biotin 10000 MCG TABS Take 1 tablet by mouth daily.    Marland Kitchen gabapentin (NEURONTIN) 300 MG capsule Take 300 mg by mouth once.    . lidocaine-prilocaine (EMLA) cream Apply to affected area once 30 g 3  . LORazepam (ATIVAN) 0.5 MG tablet TAKE ONE TABLET DAILY AT BEDTIME AS NEEDED FOR ANXIETY 30 tablet 1  . Multiple Vitamins-Minerals (WOMENS MULTI VITAMIN & MINERAL) TABS Take 1 each by mouth.    . pantoprazole (PROTONIX) 40 MG tablet Take by  mouth. As needed     No current facility-administered medications for this encounter.     Physical Findings: The patient is in no acute distress. Patient is alert and oriented.  height is '5\' 1"'  (1.549 m) and weight is 129 lb (58.5 kg). Her oral temperature is 98.1 F (36.7 C). Her blood pressure is 128/80 and her pulse is 72. Her respiration is 18 and oxygen saturation is 99%. .  No significant changes. Lungs are clear to auscultation bilaterally. Heart has regular rate and rhythm. No palpable cervical, supraclavicular, or axillary adenopathy. Abdomen soft, non-tender, normal bowel sounds. Right breast with no palpable mass, nipple discharge or bleeding. Left chest wall the patient's skin is well-healed, no signs of skin breakdown or residual radiation reaction. No palpable mass or signs of recurrence.  Lab Findings: Lab Results  Component Value Date   WBC 3.9 (L) 11/18/2018   HGB 12.7 11/18/2018   HCT 37.0 11/18/2018   MCV 91.6 11/18/2018   PLT 161 11/18/2018    Radiographic Findings: No results found.  Impression:  The patient is recovering from the effects of radiation.  No evidence of recurrence on clinical exam  Plan:  Pt will f/u in 3 months. She will continue on Herceptin.  ____________________________________   Blair Promise, PhD, MD    This document serves as a record of services personally performed by Gery Pray, MD. It was created on his behalf by Mary-Margaret Loma Messing, a trained medical scribe. The creation of this record is based on the scribe's personal observations and the provider's statements to them. This document has been checked and approved by the attending provider.

## 2018-12-06 NOTE — Progress Notes (Signed)
Pt presents today for f/u with Dr. Sondra Come. Pt reports fatigue is improving, and reports recent trip to [facility] with a lot of walking without difficulty. Pt denies c/o pain. Pt endorses using vitamin E oil. Breast skin is only slightly hyperpigmented.   BP 128/80 (BP Location: Right Arm, Patient Position: Sitting)   Pulse 72   Temp 98.1 F (36.7 C) (Oral)   Resp 18   Ht 5\' 1"  (1.549 m)   Wt 129 lb (58.5 kg)   SpO2 99%   BMI 24.37 kg/m   Wt Readings from Last 3 Encounters:  12/06/18 129 lb (58.5 kg)  11/18/18 128 lb 14.4 oz (58.5 kg)  11/10/18 129 lb 6.4 oz (58.7 kg)   Loma Sousa, RN BSN

## 2018-12-09 ENCOUNTER — Inpatient Hospital Stay: Payer: BC Managed Care – PPO | Attending: Oncology

## 2018-12-09 ENCOUNTER — Inpatient Hospital Stay: Payer: BC Managed Care – PPO

## 2018-12-09 VITALS — BP 143/92 | HR 71 | Temp 98.7°F | Resp 18

## 2018-12-09 DIAGNOSIS — C50512 Malignant neoplasm of lower-outer quadrant of left female breast: Secondary | ICD-10-CM

## 2018-12-09 DIAGNOSIS — Z5112 Encounter for antineoplastic immunotherapy: Secondary | ICD-10-CM | POA: Diagnosis present

## 2018-12-09 DIAGNOSIS — Z171 Estrogen receptor negative status [ER-]: Secondary | ICD-10-CM

## 2018-12-09 LAB — CBC WITH DIFFERENTIAL/PLATELET
ABS IMMATURE GRANULOCYTES: 0.01 10*3/uL (ref 0.00–0.07)
Basophils Absolute: 0 10*3/uL (ref 0.0–0.1)
Basophils Relative: 0 %
EOS PCT: 3 %
Eosinophils Absolute: 0.1 10*3/uL (ref 0.0–0.5)
HEMATOCRIT: 36.3 % (ref 36.0–46.0)
Hemoglobin: 12.2 g/dL (ref 12.0–15.0)
IMMATURE GRANULOCYTES: 0 %
LYMPHS ABS: 0.6 10*3/uL — AB (ref 0.7–4.0)
Lymphocytes Relative: 20 %
MCH: 30.4 pg (ref 26.0–34.0)
MCHC: 33.6 g/dL (ref 30.0–36.0)
MCV: 90.5 fL (ref 80.0–100.0)
MONOS PCT: 11 %
Monocytes Absolute: 0.4 10*3/uL (ref 0.1–1.0)
NEUTROS ABS: 2 10*3/uL (ref 1.7–7.7)
NEUTROS PCT: 66 %
PLATELETS: 152 10*3/uL (ref 150–400)
RBC: 4.01 MIL/uL (ref 3.87–5.11)
RDW: 11.9 % (ref 11.5–15.5)
WBC: 3.1 10*3/uL — AB (ref 4.0–10.5)
nRBC: 0 % (ref 0.0–0.2)

## 2018-12-09 LAB — COMPREHENSIVE METABOLIC PANEL
ALBUMIN: 3.7 g/dL (ref 3.5–5.0)
ALT: 52 U/L — AB (ref 0–44)
AST: 45 U/L — ABNORMAL HIGH (ref 15–41)
Alkaline Phosphatase: 133 U/L — ABNORMAL HIGH (ref 38–126)
Anion gap: 10 (ref 5–15)
BILIRUBIN TOTAL: 0.4 mg/dL (ref 0.3–1.2)
BUN: 8 mg/dL (ref 6–20)
CALCIUM: 9.5 mg/dL (ref 8.9–10.3)
CO2: 26 mmol/L (ref 22–32)
CREATININE: 0.66 mg/dL (ref 0.44–1.00)
Chloride: 106 mmol/L (ref 98–111)
GFR calc Af Amer: >60 mL/min (ref >60)
Glucose, Bld: 91 mg/dL (ref 70–99)
Potassium: 4.1 mmol/L (ref 3.5–5.1)
SODIUM: 142 mmol/L (ref 135–145)
TOTAL PROTEIN: 6.7 g/dL (ref 6.5–8.1)

## 2018-12-09 MED ORDER — DIPHENHYDRAMINE HCL 25 MG PO CAPS
ORAL_CAPSULE | ORAL | Status: AC
  Filled 2018-12-09: qty 2

## 2018-12-09 MED ORDER — SODIUM CHLORIDE 0.9% FLUSH
10.0000 mL | INTRAVENOUS | Status: DC | PRN
  Administered 2018-12-09: 10 mL
  Filled 2018-12-09: qty 10

## 2018-12-09 MED ORDER — HEPARIN SOD (PORK) LOCK FLUSH 100 UNIT/ML IV SOLN
500.0000 [IU] | Freq: Once | INTRAVENOUS | Status: AC | PRN
  Administered 2018-12-09: 500 [IU]
  Filled 2018-12-09: qty 5

## 2018-12-09 MED ORDER — ACETAMINOPHEN 325 MG PO TABS
ORAL_TABLET | ORAL | Status: AC
  Filled 2018-12-09: qty 2

## 2018-12-09 MED ORDER — TRASTUZUMAB CHEMO 150 MG IV SOLR
6.0000 mg/kg | Freq: Once | INTRAVENOUS | Status: AC
  Administered 2018-12-09: 336 mg via INTRAVENOUS
  Filled 2018-12-09: qty 16

## 2018-12-09 MED ORDER — DIPHENHYDRAMINE HCL 25 MG PO CAPS
50.0000 mg | ORAL_CAPSULE | Freq: Once | ORAL | Status: AC
  Administered 2018-12-09: 50 mg via ORAL

## 2018-12-09 MED ORDER — SODIUM CHLORIDE 0.9 % IV SOLN
Freq: Once | INTRAVENOUS | Status: AC
  Administered 2018-12-09: 11:00:00 via INTRAVENOUS
  Filled 2018-12-09: qty 250

## 2018-12-09 MED ORDER — ACETAMINOPHEN 325 MG PO TABS
650.0000 mg | ORAL_TABLET | Freq: Once | ORAL | Status: AC
  Administered 2018-12-09: 650 mg via ORAL

## 2018-12-09 NOTE — Patient Instructions (Signed)
[facility] Discharge Instructions for Patients Receiving Chemotherapy  Today you received the following chemotherapy agents Herceptin  To help prevent nausea and vomiting after your treatment, we encourage you to take your nausea medication as directed   If you develop nausea and vomiting that is not controlled by your nausea medication, call the clinic.   BELOW ARE SYMPTOMS THAT SHOULD BE REPORTED IMMEDIATELY:  *FEVER GREATER THAN 100.5 F  *CHILLS WITH OR WITHOUT FEVER  NAUSEA AND VOMITING THAT IS NOT CONTROLLED WITH YOUR NAUSEA MEDICATION  *UNUSUAL SHORTNESS OF BREATH  *UNUSUAL BRUISING OR BLEEDING  TENDERNESS IN MOUTH AND THROAT WITH OR WITHOUT PRESENCE OF ULCERS  *URINARY PROBLEMS  *BOWEL PROBLEMS  UNUSUAL RASH Items with * indicate a potential emergency and should be followed up as soon as possible.  Feel free to call the clinic should you have any questions or concerns. The clinic phone number is [phone].  Please show the CHEMO ALERT CARD at check-in to the Emergency Department and triage nurse.   

## 2018-12-30 ENCOUNTER — Inpatient Hospital Stay: Payer: BC Managed Care – PPO

## 2018-12-30 ENCOUNTER — Inpatient Hospital Stay: Payer: BC Managed Care – PPO | Attending: Oncology

## 2018-12-30 VITALS — BP 133/72 | Temp 98.4°F | Resp 18

## 2018-12-30 DIAGNOSIS — C50512 Malignant neoplasm of lower-outer quadrant of left female breast: Secondary | ICD-10-CM

## 2018-12-30 DIAGNOSIS — Z171 Estrogen receptor negative status [ER-]: Secondary | ICD-10-CM

## 2018-12-30 DIAGNOSIS — Z95828 Presence of other vascular implants and grafts: Secondary | ICD-10-CM

## 2018-12-30 DIAGNOSIS — Z5112 Encounter for antineoplastic immunotherapy: Secondary | ICD-10-CM | POA: Insufficient documentation

## 2018-12-30 LAB — COMPREHENSIVE METABOLIC PANEL
ALBUMIN: 3.8 g/dL (ref 3.5–5.0)
ALK PHOS: 136 U/L — AB (ref 38–126)
ALT: 45 U/L — ABNORMAL HIGH (ref 0–44)
ANION GAP: 7 (ref 5–15)
AST: 30 U/L (ref 15–41)
BUN: 9 mg/dL (ref 6–20)
CALCIUM: 9.8 mg/dL (ref 8.9–10.3)
CO2: 27 mmol/L (ref 22–32)
Chloride: 106 mmol/L (ref 98–111)
Creatinine, Ser: 0.67 mg/dL (ref 0.44–1.00)
GFR calc Af Amer: >60 mL/min (ref >60)
GFR calc non Af Amer: >60 mL/min (ref >60)
GLUCOSE: 95 mg/dL (ref 70–99)
POTASSIUM: 3.9 mmol/L (ref 3.5–5.1)
SODIUM: 140 mmol/L (ref 135–145)
Total Bilirubin: 0.4 mg/dL (ref 0.3–1.2)
Total Protein: 6.8 g/dL (ref 6.5–8.1)

## 2018-12-30 LAB — CBC WITH DIFFERENTIAL/PLATELET
Abs Immature Granulocytes: 0.01 10*3/uL (ref 0.00–0.07)
Basophils Absolute: 0 10*3/uL (ref 0.0–0.1)
Basophils Relative: 1 %
Eosinophils Absolute: 0.2 10*3/uL (ref 0.0–0.5)
Eosinophils Relative: 4 %
HEMATOCRIT: 36.4 % (ref 36.0–46.0)
HEMOGLOBIN: 12.1 g/dL (ref 12.0–15.0)
IMMATURE GRANULOCYTES: 0 %
LYMPHS ABS: 1 10*3/uL (ref 0.7–4.0)
Lymphocytes Relative: 24 %
MCH: 30.3 pg (ref 26.0–34.0)
MCHC: 33.2 g/dL (ref 30.0–36.0)
MCV: 91 fL (ref 80.0–100.0)
MONOS PCT: 7 %
Monocytes Absolute: 0.3 10*3/uL (ref 0.1–1.0)
NEUTROS PCT: 64 %
Neutro Abs: 2.6 10*3/uL (ref 1.7–7.7)
Platelets: 187 10*3/uL (ref 150–400)
RBC: 4 MIL/uL (ref 3.87–5.11)
RDW: 12.2 % (ref 11.5–15.5)
WBC: 4.1 10*3/uL (ref 4.0–10.5)
nRBC: 0 % (ref 0.0–0.2)

## 2018-12-30 MED ORDER — SODIUM CHLORIDE 0.9% FLUSH
10.0000 mL | INTRAVENOUS | Status: DC | PRN
  Administered 2018-12-30: 10 mL
  Filled 2018-12-30: qty 10

## 2018-12-30 MED ORDER — DIPHENHYDRAMINE HCL 25 MG PO CAPS
50.0000 mg | ORAL_CAPSULE | Freq: Once | ORAL | Status: AC
  Administered 2018-12-30: 50 mg via ORAL

## 2018-12-30 MED ORDER — ACETAMINOPHEN 325 MG PO TABS
650.0000 mg | ORAL_TABLET | Freq: Once | ORAL | Status: AC
  Administered 2018-12-30: 650 mg via ORAL

## 2018-12-30 MED ORDER — SODIUM CHLORIDE 0.9 % IV SOLN
Freq: Once | INTRAVENOUS | Status: AC
  Administered 2018-12-30: 11:00:00 via INTRAVENOUS
  Filled 2018-12-30: qty 250

## 2018-12-30 MED ORDER — TRASTUZUMAB CHEMO 150 MG IV SOLR
6.0000 mg/kg | Freq: Once | INTRAVENOUS | Status: AC
  Administered 2018-12-30: 336 mg via INTRAVENOUS
  Filled 2018-12-30: qty 16

## 2018-12-30 MED ORDER — ACETAMINOPHEN 325 MG PO TABS
ORAL_TABLET | ORAL | Status: AC
  Filled 2018-12-30: qty 2

## 2018-12-30 MED ORDER — HEPARIN SOD (PORK) LOCK FLUSH 100 UNIT/ML IV SOLN
500.0000 [IU] | Freq: Once | INTRAVENOUS | Status: AC | PRN
  Administered 2018-12-30: 500 [IU]
  Filled 2018-12-30: qty 5

## 2018-12-30 MED ORDER — DIPHENHYDRAMINE HCL 25 MG PO CAPS
ORAL_CAPSULE | ORAL | Status: AC
  Filled 2018-12-30: qty 2

## 2018-12-30 NOTE — Patient Instructions (Signed)
[facility] Discharge Instructions for Patients Receiving Chemotherapy  Today you received the following chemotherapy agents Herceptin  To help prevent nausea and vomiting after your treatment, we encourage you to take your nausea medication as directed   If you develop nausea and vomiting that is not controlled by your nausea medication, call the clinic.   BELOW ARE SYMPTOMS THAT SHOULD BE REPORTED IMMEDIATELY:  *FEVER GREATER THAN 100.5 F  *CHILLS WITH OR WITHOUT FEVER  NAUSEA AND VOMITING THAT IS NOT CONTROLLED WITH YOUR NAUSEA MEDICATION  *UNUSUAL SHORTNESS OF BREATH  *UNUSUAL BRUISING OR BLEEDING  TENDERNESS IN MOUTH AND THROAT WITH OR WITHOUT PRESENCE OF ULCERS  *URINARY PROBLEMS  *BOWEL PROBLEMS  UNUSUAL RASH Items with * indicate a potential emergency and should be followed up as soon as possible.  Feel free to call the clinic should you have any questions or concerns. The clinic phone number is [phone].  Please show the CHEMO ALERT CARD at check-in to the Emergency Department and triage nurse.   

## 2019-01-11 ENCOUNTER — Encounter: Payer: BC Managed Care – PPO | Admitting: Internal Medicine

## 2019-01-20 ENCOUNTER — Inpatient Hospital Stay: Payer: BC Managed Care – PPO

## 2019-01-20 ENCOUNTER — Encounter: Payer: Self-pay | Admitting: Internal Medicine

## 2019-01-20 ENCOUNTER — Other Ambulatory Visit: Payer: Self-pay | Admitting: Oncology

## 2019-01-20 ENCOUNTER — Ambulatory Visit (AMBULATORY_SURGERY_CENTER): Payer: Self-pay | Admitting: *Deleted

## 2019-01-20 VITALS — Ht 63.0 in | Wt 129.0 lb

## 2019-01-20 VITALS — BP 127/71 | HR 65 | Temp 98.5°F | Resp 16

## 2019-01-20 DIAGNOSIS — Z171 Estrogen receptor negative status [ER-]: Principal | ICD-10-CM

## 2019-01-20 DIAGNOSIS — Z95828 Presence of other vascular implants and grafts: Secondary | ICD-10-CM

## 2019-01-20 DIAGNOSIS — C50512 Malignant neoplasm of lower-outer quadrant of left female breast: Secondary | ICD-10-CM

## 2019-01-20 DIAGNOSIS — Z800 Family history of malignant neoplasm of digestive organs: Secondary | ICD-10-CM

## 2019-01-20 LAB — CBC WITH DIFFERENTIAL/PLATELET
Abs Immature Granulocytes: 0.02 10*3/uL (ref 0.00–0.07)
Basophils Absolute: 0 10*3/uL (ref 0.0–0.1)
Basophils Relative: 1 %
Eosinophils Absolute: 0.1 10*3/uL (ref 0.0–0.5)
Eosinophils Relative: 2 %
HCT: 36.1 % (ref 36.0–46.0)
HEMOGLOBIN: 11.8 g/dL — AB (ref 12.0–15.0)
Immature Granulocytes: 1 %
LYMPHS PCT: 29 %
Lymphs Abs: 1 10*3/uL (ref 0.7–4.0)
MCH: 29.9 pg (ref 26.0–34.0)
MCHC: 32.7 g/dL (ref 30.0–36.0)
MCV: 91.4 fL (ref 80.0–100.0)
MONOS PCT: 8 %
Monocytes Absolute: 0.3 10*3/uL (ref 0.1–1.0)
NEUTROS PCT: 59 %
Neutro Abs: 2 10*3/uL (ref 1.7–7.7)
Platelets: 158 10*3/uL (ref 150–400)
RBC: 3.95 MIL/uL (ref 3.87–5.11)
RDW: 12.9 % (ref 11.5–15.5)
WBC: 3.4 10*3/uL — ABNORMAL LOW (ref 4.0–10.5)
nRBC: 0 % (ref 0.0–0.2)

## 2019-01-20 LAB — COMPREHENSIVE METABOLIC PANEL
ALBUMIN: 3.9 g/dL (ref 3.5–5.0)
ALK PHOS: 106 U/L (ref 38–126)
ALT: 21 U/L (ref 0–44)
ANION GAP: 7 (ref 5–15)
AST: 20 U/L (ref 15–41)
BILIRUBIN TOTAL: 0.5 mg/dL (ref 0.3–1.2)
BUN: 10 mg/dL (ref 6–20)
CO2: 29 mmol/L (ref 22–32)
Calcium: 10 mg/dL (ref 8.9–10.3)
Chloride: 106 mmol/L (ref 98–111)
Creatinine, Ser: 0.71 mg/dL (ref 0.44–1.00)
Glucose, Bld: 113 mg/dL — ABNORMAL HIGH (ref 70–99)
POTASSIUM: 4.1 mmol/L (ref 3.5–5.1)
Sodium: 142 mmol/L (ref 135–145)
TOTAL PROTEIN: 6.7 g/dL (ref 6.5–8.1)

## 2019-01-20 MED ORDER — TRASTUZUMAB CHEMO 150 MG IV SOLR
6.0000 mg/kg | Freq: Once | INTRAVENOUS | Status: AC
  Administered 2019-01-20: 336 mg via INTRAVENOUS
  Filled 2019-01-20: qty 16

## 2019-01-20 MED ORDER — HEPARIN SOD (PORK) LOCK FLUSH 100 UNIT/ML IV SOLN
500.0000 [IU] | Freq: Once | INTRAVENOUS | Status: AC | PRN
  Administered 2019-01-20: 500 [IU]
  Filled 2019-01-20: qty 5

## 2019-01-20 MED ORDER — SODIUM CHLORIDE 0.9 % IV SOLN
Freq: Once | INTRAVENOUS | Status: AC
  Administered 2019-01-20: 10:00:00 via INTRAVENOUS
  Filled 2019-01-20: qty 250

## 2019-01-20 MED ORDER — DIPHENHYDRAMINE HCL 25 MG PO CAPS
ORAL_CAPSULE | ORAL | Status: AC
  Filled 2019-01-20: qty 2

## 2019-01-20 MED ORDER — ACETAMINOPHEN 325 MG PO TABS
ORAL_TABLET | ORAL | Status: AC
  Filled 2019-01-20: qty 2

## 2019-01-20 MED ORDER — DIPHENHYDRAMINE HCL 25 MG PO CAPS
50.0000 mg | ORAL_CAPSULE | Freq: Once | ORAL | Status: AC
  Administered 2019-01-20: 50 mg via ORAL

## 2019-01-20 MED ORDER — ACETAMINOPHEN 325 MG PO TABS
650.0000 mg | ORAL_TABLET | Freq: Once | ORAL | Status: AC
  Administered 2019-01-20: 650 mg via ORAL

## 2019-01-20 MED ORDER — SODIUM CHLORIDE 0.9% FLUSH
10.0000 mL | INTRAVENOUS | Status: DC | PRN
  Administered 2019-01-20: 10 mL
  Filled 2019-01-20: qty 10

## 2019-01-20 NOTE — Progress Notes (Signed)
No egg or soy allergy known to patient  No issues with past sedation with any surgeries  or procedures, no intubation problems  No diet pills per patient No home 02 use per patient  No blood thinners per patient  Pt denies issues with constipation  No A fib or A flutter  EMMI video sent to pt's e mail  - pt declined  Pt has a port- asked if we can access the port- Explained to her we cannot= she will have to have an IV- she said she's a hard IV stick- wishes [place] to start IV in her right Anticub-

## 2019-01-20 NOTE — Patient Instructions (Signed)

## 2019-01-20 NOTE — Patient Instructions (Signed)
[facility] Discharge Instructions for Patients Receiving Chemotherapy  Today you received the following chemotherapy agents Herceptin  To help prevent nausea and vomiting after your treatment, we encourage you to take your nausea medication as directed   If you develop nausea and vomiting that is not controlled by your nausea medication, call the clinic.   BELOW ARE SYMPTOMS THAT SHOULD BE REPORTED IMMEDIATELY:  *FEVER GREATER THAN 100.5 F  *CHILLS WITH OR WITHOUT FEVER  NAUSEA AND VOMITING THAT IS NOT CONTROLLED WITH YOUR NAUSEA MEDICATION  *UNUSUAL SHORTNESS OF BREATH  *UNUSUAL BRUISING OR BLEEDING  TENDERNESS IN MOUTH AND THROAT WITH OR WITHOUT PRESENCE OF ULCERS  *URINARY PROBLEMS  *BOWEL PROBLEMS  UNUSUAL RASH Items with * indicate a potential emergency and should be followed up as soon as possible.  Feel free to call the clinic should you have any questions or concerns. The clinic phone number is [phone].  Please show the CHEMO ALERT CARD at check-in to the Emergency Department and triage nurse.   

## 2019-02-03 ENCOUNTER — Encounter: Payer: Self-pay | Admitting: Internal Medicine

## 2019-02-03 ENCOUNTER — Ambulatory Visit (AMBULATORY_SURGERY_CENTER): Payer: BC Managed Care – PPO | Admitting: Internal Medicine

## 2019-02-03 VITALS — BP 132/59 | HR 72 | Temp 97.5°F | Resp 12 | Ht 61.0 in | Wt 128.0 lb

## 2019-02-03 DIAGNOSIS — Z1211 Encounter for screening for malignant neoplasm of colon: Secondary | ICD-10-CM | POA: Diagnosis not present

## 2019-02-03 DIAGNOSIS — Z800 Family history of malignant neoplasm of digestive organs: Secondary | ICD-10-CM | POA: Diagnosis present

## 2019-02-03 DIAGNOSIS — K635 Polyp of colon: Secondary | ICD-10-CM | POA: Diagnosis not present

## 2019-02-03 DIAGNOSIS — D122 Benign neoplasm of ascending colon: Secondary | ICD-10-CM

## 2019-02-03 MED ORDER — SODIUM CHLORIDE 0.9 % IV SOLN: 500.0000 mL | Freq: Once | INTRAVENOUS | Status: DC

## 2019-02-03 NOTE — Patient Instructions (Addendum)
   I found and removed 1 tiny polyp. I am certain it is benign but will let you know. All else ok.  Your next routine colonoscopy should be in 5 years - 2025.  I appreciate the opportunity to care for you. Gatha Mayer, MD, [facility]  Handout given for polyps.  YOU HAD AN ENDOSCOPIC PROCEDURE TODAY AT [facility]:   Refer to the procedure report that was given to you for any specific questions about what was found during the examination.  If the procedure report does not answer your questions, please call your gastroenterologist to clarify.  If you requested that your care partner not be given the details of your procedure findings, then the procedure report has been included in a sealed envelope for you to review at your convenience later.  YOU SHOULD EXPECT: Some feelings of bloating in the abdomen. Passage of more gas than usual.  Walking can help get rid of the air that was put into your GI tract during the procedure and reduce the bloating. If you had a lower endoscopy (such as a colonoscopy or flexible sigmoidoscopy) you may notice spotting of blood in your stool or on the toilet paper. If you underwent a bowel prep for your procedure, you may not have a normal bowel movement for a few days.  Please Note:  You might notice some irritation and congestion in your nose or some drainage.  This is from the oxygen used during your procedure.  There is no need for concern and it should clear up in a day or so.  SYMPTOMS TO REPORT IMMEDIATELY:   Following lower endoscopy (colonoscopy or flexible sigmoidoscopy):  Excessive amounts of blood in the stool  Significant tenderness or worsening of abdominal pains  Swelling of the abdomen that is new, acute  Fever of 100F or higher  For urgent or emergent issues, a gastroenterologist can be reached at any hour by calling [phone].   DIET:  We do recommend a small meal at first, but then you may proceed to your regular  diet.  Drink plenty of fluids but you should avoid alcoholic beverages for 24 hours.  ACTIVITY:  You should plan to take it easy for the rest of today and you should NOT DRIVE or use heavy machinery until tomorrow (because of the sedation medicines used during the test).    FOLLOW UP: Our staff will call the number listed on your records the next business day following your procedure to check on you and address any questions or concerns that you may have regarding the information given to you following your procedure. If we do not reach you, we will leave a message.  However, if you are feeling well and you are not experiencing any problems, there is no need to return our call.  We will assume that you have returned to your regular daily activities without incident.  If any biopsies were taken you will be contacted by phone or by letter within the next 1-3 weeks.  Please call us at [phone] if you have not heard about the biopsies in 3 weeks.    SIGNATURES/CONFIDENTIALITY: You and/or your care partner have signed paperwork which will be entered into your electronic medical record.  These signatures attest to the fact that that the information above on your After Visit Summary has been reviewed and is understood.  Full responsibility of the confidentiality of this discharge information lies with you and/or your care-partner.

## 2019-02-03 NOTE — Progress Notes (Signed)
Pt's states no medical or surgical changes since previsit or office visit. 

## 2019-02-03 NOTE — Op Note (Signed)
[facility] Patient Name: Monica Padilla Procedure Date: 02/03/2019 10:19 AM MRN: [id number] Endoscopist: Gatha Mayer , MD Age: 53 Referring MD:  Date of Birth: September 15, 1966 Gender: Female Account #: [id number] Procedure:                Colonoscopy Indications:              Screening in patient at increased risk: Family                            history of 1st-degree relative with colorectal                            cancer Medicines:                Propofol per Anesthesia, Monitored Anesthesia Care Procedure:                Pre-Anesthesia Assessment:                           - Prior to the procedure, a History and Physical                            was performed, and patient medications and                            allergies were reviewed. The patient's tolerance of                            previous anesthesia was also reviewed. The risks                            and benefits of the procedure and the sedation                            options and risks were discussed with the patient.                            All questions were answered, and informed consent                            was obtained. Prior Anticoagulants: The patient has                            taken no previous anticoagulant or antiplatelet                            agents. ASA Grade Assessment: II - A patient with                            mild systemic disease. After reviewing the risks                            and benefits, the patient was deemed in  satisfactory condition to undergo the procedure.                           After obtaining informed consent, the colonoscope                            was passed under direct vision. Throughout the                            procedure, the patient's blood pressure, pulse, and                            oxygen saturations were monitored continuously. The                            Colonoscope was introduced through  the anus and                            advanced to the the cecum, identified by                            appendiceal orifice and ileocecal valve. The                            colonoscopy was performed without difficulty. The                            patient tolerated the procedure well. The quality                            of the bowel preparation was excellent. The                            ileocecal valve, appendiceal orifice, and rectum                            were photographed. Scope In: 10:41:55 AM Scope Out: 10:57:10 AM Scope Withdrawal Time: 0 hours 11 minutes 48 seconds  Total Procedure Duration: 0 hours 15 minutes 15 seconds  Findings:                 The perianal and digital rectal examinations were                            normal.                           A diminutive polyp was found in the ascending                            colon. The polyp was flat. The polyp was removed                            with a cold biopsy forceps. Resection and retrieval  were complete. Verification of patient                            identification for the specimen was done. Estimated                            blood loss was minimal.                           The exam was otherwise without abnormality on                            direct and retroflexion views. Complications:            No immediate complications. Estimated Blood Loss:     Estimated blood loss was minimal. Impression:               - One diminutive polyp in the ascending colon,                            removed with a cold biopsy forceps. Resected and                            retrieved.                           - The examination was otherwise normal on direct                            and retroflexion views.                           Family hx GI cancer in grandfather (56's) ? colon                            and father had metastatic colon cancer at 21. She                             also has hx 1 cm transverse hyperplastic colon                            polyp removed 2014. Recommendation:           - Patient has a contact number available for                            emergencies. The signs and symptoms of potential                            delayed complications were discussed with the                            patient. Return to normal activities tomorrow.                            Written discharge instructions were provided to  the                            patient.                           - Resume previous diet.                           - Continue present medications.                           - Repeat colonoscopy in 5 years. Gatha Mayer, MD 02/03/2019 11:05:48 AM This report has been signed electronically.

## 2019-02-03 NOTE — Progress Notes (Signed)
Report given to PACU, vss 

## 2019-02-03 NOTE — Progress Notes (Signed)
Called to room to assist during endoscopic procedure.  Patient ID and intended procedure confirmed with present staff. Received instructions for my participation in the procedure from the performing physician.  

## 2019-02-04 ENCOUNTER — Telehealth: Payer: Self-pay | Admitting: *Deleted

## 2019-02-04 NOTE — Telephone Encounter (Signed)
  Follow up Call-  Call back number 02/03/2019  Post procedure Call Back phone  # [phone]  Permission to leave phone message Yes  Some recent data might be hidden     Patient questions:  Do you have a fever, pain , or abdominal swelling? No. Pain Score  0 *  Have you tolerated food without any problems? Yes.    Have you been able to return to your normal activities? Yes.    Do you have any questions about your discharge instructions: Diet   No. Medications  No. Follow up visit  No.  Do you have questions or concerns about your Care? No.  Actions: * If pain score is 4 or above: No action needed, pain <4.

## 2019-02-08 ENCOUNTER — Encounter: Payer: Self-pay | Admitting: Internal Medicine

## 2019-02-08 DIAGNOSIS — Z860100 Personal history of colon polyps, unspecified: Secondary | ICD-10-CM | POA: Insufficient documentation

## 2019-02-08 HISTORY — DX: Personal history of colon polyps, unspecified: Z86.0100

## 2019-02-08 HISTORY — DX: Personal history of colonic polyps: Z86.010

## 2019-02-08 NOTE — Progress Notes (Signed)
[facility]in[facility] ssp  Recall 2025 My Chart

## 2019-02-10 ENCOUNTER — Inpatient Hospital Stay: Payer: BC Managed Care – PPO

## 2019-02-10 ENCOUNTER — Other Ambulatory Visit: Payer: BC Managed Care – PPO

## 2019-02-10 ENCOUNTER — Inpatient Hospital Stay: Payer: BC Managed Care – PPO | Attending: Oncology

## 2019-02-10 VITALS — BP 113/71 | HR 69 | Temp 98.4°F | Resp 18

## 2019-02-10 DIAGNOSIS — Z5112 Encounter for antineoplastic immunotherapy: Secondary | ICD-10-CM | POA: Diagnosis present

## 2019-02-10 DIAGNOSIS — C50512 Malignant neoplasm of lower-outer quadrant of left female breast: Secondary | ICD-10-CM

## 2019-02-10 DIAGNOSIS — Z171 Estrogen receptor negative status [ER-]: Secondary | ICD-10-CM | POA: Diagnosis not present

## 2019-02-10 LAB — CBC WITH DIFFERENTIAL/PLATELET
Abs Immature Granulocytes: 0 10*3/uL (ref 0.00–0.07)
Basophils Absolute: 0 10*3/uL (ref 0.0–0.1)
Basophils Relative: 1 %
Eosinophils Absolute: 0.1 10*3/uL (ref 0.0–0.5)
Eosinophils Relative: 2 %
HEMATOCRIT: 34.8 % — AB (ref 36.0–46.0)
Hemoglobin: 11.6 g/dL — ABNORMAL LOW (ref 12.0–15.0)
Immature Granulocytes: 0 %
LYMPHS ABS: 1.1 10*3/uL (ref 0.7–4.0)
Lymphocytes Relative: 27 %
MCH: 30.8 pg (ref 26.0–34.0)
MCHC: 33.3 g/dL (ref 30.0–36.0)
MCV: 92.3 fL (ref 80.0–100.0)
Monocytes Absolute: 0.2 10*3/uL (ref 0.1–1.0)
Monocytes Relative: 6 %
Neutro Abs: 2.7 10*3/uL (ref 1.7–7.7)
Neutrophils Relative %: 64 %
Platelets: 161 10*3/uL (ref 150–400)
RBC: 3.77 MIL/uL — ABNORMAL LOW (ref 3.87–5.11)
RDW: 12.9 % (ref 11.5–15.5)
WBC: 4.1 10*3/uL (ref 4.0–10.5)
nRBC: 0 % (ref 0.0–0.2)

## 2019-02-10 LAB — COMPREHENSIVE METABOLIC PANEL
ALT: 18 U/L (ref 0–44)
AST: 20 U/L (ref 15–41)
Albumin: 4.1 g/dL (ref 3.5–5.0)
Alkaline Phosphatase: 104 U/L (ref 38–126)
Anion gap: 7 (ref 5–15)
BUN: 9 mg/dL (ref 6–20)
CO2: 30 mmol/L (ref 22–32)
Calcium: 9.7 mg/dL (ref 8.9–10.3)
Chloride: 105 mmol/L (ref 98–111)
Creatinine, Ser: 0.76 mg/dL (ref 0.44–1.00)
GFR calc Af Amer: >60 mL/min (ref >60)
GFR calc non Af Amer: >60 mL/min (ref >60)
GLUCOSE: 116 mg/dL — AB (ref 70–99)
Potassium: 3.8 mmol/L (ref 3.5–5.1)
SODIUM: 142 mmol/L (ref 135–145)
Total Bilirubin: 0.4 mg/dL (ref 0.3–1.2)
Total Protein: 6.8 g/dL (ref 6.5–8.1)

## 2019-02-10 MED ORDER — SODIUM CHLORIDE 0.9 % IV SOLN
Freq: Once | INTRAVENOUS | Status: AC
  Administered 2019-02-10: 16:00:00 via INTRAVENOUS
  Filled 2019-02-10: qty 250

## 2019-02-10 MED ORDER — DIPHENHYDRAMINE HCL 25 MG PO CAPS
50.0000 mg | ORAL_CAPSULE | Freq: Once | ORAL | Status: AC
  Administered 2019-02-10: 50 mg via ORAL

## 2019-02-10 MED ORDER — TRASTUZUMAB CHEMO 150 MG IV SOLR
6.0000 mg/kg | Freq: Once | INTRAVENOUS | Status: AC
  Administered 2019-02-10: 336 mg via INTRAVENOUS
  Filled 2019-02-10: qty 16

## 2019-02-10 MED ORDER — ACETAMINOPHEN 325 MG PO TABS
ORAL_TABLET | ORAL | Status: AC
  Filled 2019-02-10: qty 2

## 2019-02-10 MED ORDER — HEPARIN SOD (PORK) LOCK FLUSH 100 UNIT/ML IV SOLN
500.0000 [IU] | Freq: Once | INTRAVENOUS | Status: AC | PRN
  Administered 2019-02-10: 500 [IU]
  Filled 2019-02-10: qty 5

## 2019-02-10 MED ORDER — ACETAMINOPHEN 325 MG PO TABS
650.0000 mg | ORAL_TABLET | Freq: Once | ORAL | Status: AC
  Administered 2019-02-10: 650 mg via ORAL

## 2019-02-10 MED ORDER — SODIUM CHLORIDE 0.9% FLUSH
10.0000 mL | INTRAVENOUS | Status: DC | PRN
  Administered 2019-02-10: 10 mL
  Filled 2019-02-10: qty 10

## 2019-02-10 MED ORDER — DIPHENHYDRAMINE HCL 25 MG PO CAPS
ORAL_CAPSULE | ORAL | Status: AC
  Filled 2019-02-10: qty 2

## 2019-02-14 ENCOUNTER — Ambulatory Visit (HOSPITAL_COMMUNITY)
Admission: RE | Admit: 2019-02-14 | Discharge: 2019-02-14 | Disposition: A | Discharge status: Home / Self Care | Payer: BC Managed Care – PPO | Source: Ambulatory Visit | Attending: Internal Medicine | Admitting: Internal Medicine

## 2019-02-14 ENCOUNTER — Ambulatory Visit (HOSPITAL_BASED_OUTPATIENT_CLINIC_OR_DEPARTMENT_OTHER)
Admission: RE | Admit: 2019-02-14 | Discharge: 2019-02-14 | Disposition: A | Discharge status: Home / Self Care | Payer: BC Managed Care – PPO | Source: Ambulatory Visit | Attending: Internal Medicine | Admitting: Internal Medicine

## 2019-02-14 ENCOUNTER — Encounter (HOSPITAL_COMMUNITY): Payer: Self-pay | Admitting: Internal Medicine

## 2019-02-14 VITALS — BP 128/78 | HR 98 | Wt 126.6 lb

## 2019-02-14 DIAGNOSIS — Z881 Allergy status to other antibiotic agents status: Secondary | ICD-10-CM | POA: Diagnosis not present

## 2019-02-14 DIAGNOSIS — Z800 Family history of malignant neoplasm of digestive organs: Secondary | ICD-10-CM | POA: Insufficient documentation

## 2019-02-14 DIAGNOSIS — Z79899 Other long term (current) drug therapy: Secondary | ICD-10-CM | POA: Insufficient documentation

## 2019-02-14 DIAGNOSIS — C50512 Malignant neoplasm of lower-outer quadrant of left female breast: Secondary | ICD-10-CM

## 2019-02-14 DIAGNOSIS — Z888 Allergy status to other drugs, medicaments and biological substances status: Secondary | ICD-10-CM | POA: Diagnosis not present

## 2019-02-14 DIAGNOSIS — Z801 Family history of malignant neoplasm of trachea, bronchus and lung: Secondary | ICD-10-CM | POA: Insufficient documentation

## 2019-02-14 DIAGNOSIS — C50912 Malignant neoplasm of unspecified site of left female breast: Secondary | ICD-10-CM | POA: Insufficient documentation

## 2019-02-14 DIAGNOSIS — Z171 Estrogen receptor negative status [ER-]: Secondary | ICD-10-CM

## 2019-02-14 DIAGNOSIS — Z85828 Personal history of other malignant neoplasm of skin: Secondary | ICD-10-CM | POA: Diagnosis not present

## 2019-02-14 DIAGNOSIS — Z880 Allergy status to penicillin: Secondary | ICD-10-CM | POA: Diagnosis not present

## 2019-02-14 DIAGNOSIS — K219 Gastro-esophageal reflux disease without esophagitis: Secondary | ICD-10-CM | POA: Diagnosis not present

## 2019-02-14 DIAGNOSIS — Z803 Family history of malignant neoplasm of breast: Secondary | ICD-10-CM | POA: Diagnosis not present

## 2019-02-14 DIAGNOSIS — Z885 Allergy status to narcotic agent status: Secondary | ICD-10-CM | POA: Diagnosis not present

## 2019-02-14 DIAGNOSIS — Z9012 Acquired absence of left breast and nipple: Secondary | ICD-10-CM | POA: Diagnosis not present

## 2019-02-14 NOTE — Progress Notes (Signed)
  Echocardiogram 2D Echocardiogram has been performed.  Monica Padilla 02/14/2019, 11:47 AM

## 2019-02-14 NOTE — Addendum Note (Signed)
Encounter addended by: Marlise Eves, RN on: 02/14/2019 12:36 PM  Actions taken: Clinical Note Signed

## 2019-02-14 NOTE — Patient Instructions (Signed)
Your physician has requested that you have an echocardiogram. Echocardiography is a painless test that uses sound waves to create images of your heart. It provides your doctor with information about the size and shape of your heart and how well your heart's chambers and valves are working. This procedure takes approximately one hour. There are no restrictions for this procedure.  Follow up with Dr. Haroldine Laws in 3 months.

## 2019-02-14 NOTE — Progress Notes (Signed)
Cardio-Oncology Clinic  Note   Referring Physician: Dr. Jana Hakim Primary Care: Teressa Lower, MD Primary Cardiologist: Dr. Haroldine Laws  HPI:  Monica Padilla is a 53 y.o. female with past medical history of left breast cancer who has been referred by Dr. Jana Hakim to establish in the cardio-oncology clinic for monitoring of cardio-toxicity while undergoing chemotherapy.   Cancer Profile      03/17/18 Initial Diagnosis/Biopsy      ([id number]), invasive ductal carcinoma, grade 2, estrogen and progesterone receptor negative, with an MIB-1 of 80%, but HER-2 amplified with a signals ratio 5.51 and the number per cell 17.35.    04/02/18 Surgery     Left mastectomy and sentinel lymph node sampling, as well as right-sided port placement   Treatment Plan  (1) genetics testing 06/15/2018 (2) adjuvant chemotherapy consisting of carboplatin and docetaxel given every 21 days x 6, started 05/13/2018, completed 08/26/2018 (3) Trastuzumab and Pertuzumab started 05/13/2018; trastuzumab to be continued to complete a year             (a) epratuzumab discontinued after cycle 2 because of allergic reactions             (b) echocardiogram 05/06/2018 shows an ejection fraction in the 65-70%             (c) echocardiogram 08/12/2018 shows an ejection fraction in the 65-70%             (d) echocardiogram 02/14/2018 shows an ejection fraction in the 65-70% (4) adjuvant radiation completed 11/01/18 (5) s/p mastectomy 03/2018  Doing well. Continues on Herceptin. Has retired on 12/28/18 was working for [employer] in the [employer]. No SOB or edema.    Echo today 02/14/19 shows EF 60-65%  Echo 11/10/18  EF 60-65%, GLS -15.1 with poor windows (underestimated) -    Echo 08/12/18 LVEF 65-70%, Grade 1 DD, Trivial MR, Trivial TR. GLS -22.6%, Lateral S' 10 cm/s.   Past Medical History:  Diagnosis Date  . Breast cancer ([place])    left   . Cancer (HCC)    basal cell CA- chest, leg  . Family history of breast  cancer   . Family history of colon cancer   . Family history of lung cancer   . GERD (gastroesophageal reflux disease)   . History of chemotherapy    finished chemo 08-26-2018  . History of radiation therapy    last 11-01-2018  . Hx of colonic polyp 02/08/2019    Current Outpatient Medications  Medication Sig Dispense Refill  . Biotin 10000 MCG TABS Take 1 tablet by mouth daily.    . Black Elderberry 50 MG/5ML SYRP 1 tsp daily    . gabapentin (NEURONTIN) 300 MG capsule Take 300 mg by mouth once.    . lidocaine-prilocaine (EMLA) cream Apply to affected area once 30 g 3  . LORazepam (ATIVAN) 0.5 MG tablet TAKE ONE TABLET DAILY AT BEDTIME AS NEEDED FOR ANXIETY 30 tablet 1  . Multiple Vitamins-Minerals (WOMENS MULTI VITAMIN & MINERAL) TABS Take 1 each by mouth.    Marland Kitchen OVER THE COUNTER MEDICATION Whole Mega    . pantoprazole (PROTONIX) 40 MG tablet Take by mouth. As needed    . Trastuzumab (HERCEPTIN IV) Inject into the vein every 21 ( twenty-one) days.     No current facility-administered medications for this encounter.    Allergies  Allergen Reactions  . Codeine Itching  . Nitrofurantoin     REACTION: pruritis  . Amoxicillin Rash  . Pertuzumab Rash  Social History   Socioeconomic History  . Marital status: Married    Spouse name: Not on file  . Number of children: Not on file  . Years of education: Not on file  . Highest education level: Not on file  Occupational History  . Not on file  Social Needs  . Financial resource strain: Not on file  . Food insecurity:    Worry: Not on file    Inability: Not on file  . Transportation needs:    Medical: Not on file    Non-medical: Not on file  Tobacco Use  . Smoking status: Never Smoker  . Smokeless tobacco: Never Used  Substance and Sexual Activity  . Alcohol use: Yes    Comment: occasional  . Drug use: No  . Sexual activity: Not on file  Lifestyle  . Physical activity:    Days per week: Not on file    Minutes per  session: Not on file  . Stress: Not on file  Relationships  . Social connections:    Talks on phone: Not on file    Gets together: Not on file    Attends religious service: Not on file    Active member of club or organization: Not on file    Attends meetings of clubs or organizations: Not on file    Relationship status: Not on file  . Intimate partner violence:    Fear of current or ex partner: Not on file    Emotionally abused: Not on file    Physically abused: Not on file    Forced sexual activity: Not on file  Other Topics Concern  . Not on file  Social History Narrative  . Not on file     Family History  Problem Relation Age of Onset  . Colon cancer Father 57  . Basal cell carcinoma Sister        2-3  . Lung cancer Maternal Aunt        hx smoking  . Lung cancer Paternal Uncle        hx smoking  . Heart disease Maternal Grandmother   . Heart disease Maternal Grandfather   . Cancer Paternal Grandfather        unk if colon or stomach? died in early 57's.   . Breast cancer Cousin   . Esophageal cancer Neg Hx   . Stomach cancer Neg Hx   . Rectal cancer Neg Hx   . Colon polyps Neg Hx    Vitals:   02/14/19 1145  BP: 128/78  Pulse: 98  SpO2: (!) 56%  Weight: 57.4 kg (126 lb 9.6 oz)   PHYSICAL EXAM: General:  Well appearing. No resp difficulty HEENT: normal Neck: supple. no JVD. Carotids 2+ bilat; no bruits. No lymphadenopathy or thryomegaly appreciated. Cor: PMI nondisplaced. Regular rate & rhythm. No rubs, gallops or murmurs. + port Lungs: clear Abdomen: soft, nontender, nondistended. No hepatosplenomegaly. No bruits or masses. Good bowel sounds. Extremities: no cyanosis, clubbing, rash, edema Neuro: alert & orientedx3, cranial nerves grossly intact. moves all 4 extremities w/o difficulty. Affect pleasant  ASSESSMENT & PLAN:  1. Estrogen receptor negative, HER-2 positive LEFT breast cancer - Trastuzumab(Herceptin) and Pertuzumab started 05/13/2018; trastuzumab  to be continued to complete a year. Pertuzamab discontinued after cycle 2 because of allergic reactions - Echo today EF stable 60-65% - I reviewed echos personally. EF and Doppler parameters stable. No HF on exam. Continue Herceptin.    Glori Bickers, MD  12:16 PM

## 2019-02-14 NOTE — Addendum Note (Signed)
Encounter addended by: Jovita Kussmaul, RN on: 02/14/2019 12:36 PM  Actions taken: Order list changed, Diagnosis association updated

## 2019-03-02 NOTE — Progress Notes (Signed)
[facility]  Telephone:([phone]) [phone] Fax:(336) [phone]    ID: Monica Padilla DOB: [birth year]  MR#: [id number]  OFH#:[id number]  Patient Care Team: Algis Greenhouse, MD as PCP - General (Family Medicine) Signe Colt, MD as Referring Physician (Obstetrics and Gynecology) Magrinat, Virgie Dad, MD as Consulting Physician (Oncology) Larey Dresser, MD as Consulting Physician (Cardiology) Noberto Retort Juanda Bond., MD as Referring Physician (Surgery) Gatha Mayer, MD as Consulting Physician (Gastroenterology) Nolon Nations, MD as Consulting Physician (Diagnostic Radiology) OTHER MD: Dr. Jimmye Norman and Dr. Michele Mcalpine at [place]: Estrogen receptor negative, HER-2 positive breast cancer  CURRENT TREATMENT: trastuzumab   INTERVAL HISTORY: Monica Padilla returns today for follow-up and treatment of her estrogen receptor negative, HER-2 positive breast cancer.   She continues on trastuzumab, with a dose due today. She tolerates this well and without any noticeable side effects.   Monica Padilla's last echocardiogram on 02/14/2019, showed an ejection fraction in the 60% - 65% range.   Since her last visit here, she has not undergone any additional studies. She will be due for mammography this month ([facility]).    REVIEW OF SYSTEMS: Monica Padilla tries to go to an exercise class three times per week. She has acid reflux, which she controls through her diet with positive results. She has decided against reconstruction at the moment; she is having a custom prosthesis made. The patient denies unusual headaches, visual changes, nausea, vomiting, or dizziness. There has been no unusual cough, phlegm production, or pleurisy. This been no change in bowel or bladder habits. The patient denies unexplained fatigue or unexplained weight loss, bleeding, rash, or fever. A detailed review of systems was otherwise noncontributory.    HISTORY  OF CURRENT ILLNESS: From the original intake note:  The patient noted a palpable mass in her left breast as she was laying down. She brought this to medical attention immediately and had left diagnostic mammography with tomography at breast center  03/11/2018 showing a 2.5 cm palpable mass in the lower outer quadrant of the left breast, with one slightly prominent lymph node that had increased cortical measurement.  Biopsy of this left breast mass 03/17/2018 showed ([id number] [id number]), invasive ductal carcinoma, grade 2, estrogen and progesterone receptor negative, with an MIB-1 of 80%, but HER-2 amplified with a signals ratio 5.51 and the number per cell 17.35.  She underwent left mastectomy and sentinel lymph node sampling, as well as right-sided port placement 04/02/2018 under Etheleen Mayhew at [facility] for a 2.6 cm invasive ductal carcinoma, grade 3, with 1 of 3 sentinel nodes showing a micrometastatic deposit, but all margins negative.  She had a port placed at the same time.  The patient's subsequent history is as detailed below.    PAST MEDICAL HISTORY: Past Medical History:  Diagnosis Date  . Breast cancer ([place])    left   . Cancer ([facility])    basal cell CA- chest, leg  . Family history of breast cancer   . Family history of colon cancer   . Family history of lung cancer   . GERD (gastroesophageal reflux disease)   . History of chemotherapy    finished chemo 08-26-2018  . History of radiation therapy    last 11-01-2018  . Hx of colonic polyp 02/08/2019    PAST SURGICAL HISTORY: Past Surgical History:  Procedure Laterality Date  . CESAREAN SECTION  1998   x1  . COLONOSCOPY    . DILATION  AND CURETTAGE OF UTERUS    . fatty tumor removal  06/2016   lower right back   . left mastectomy Left 03/2018  . PARTIAL HYSTERECTOMY    . SKIN CANCER EXCISION    . VAGINAL DELIVERY     x1  . WISDOM TOOTH EXTRACTION      FAMILY HISTORY: Family History  Problem Relation Age of Onset  .  Colon cancer Father 75  . Basal cell carcinoma Sister        2-3  . Lung cancer Maternal Aunt        hx smoking  . Lung cancer Paternal Uncle        hx smoking  . Heart disease Maternal Grandmother   . Heart disease Maternal Grandfather   . Cancer Paternal Grandfather        unk if colon or stomach? died in early 48's.   . Breast cancer Cousin   . Esophageal cancer Neg Hx   . Stomach cancer Neg Hx   . Rectal cancer Neg Hx   . Colon polyps Neg Hx    The patient's father died due to colon cancer at age 39. The patient's mother is alive at age 28. The patient has 1 sister and no brothers. A paternal cousin was diagnosed with breast cancer in the early 86's (before 51). The patient's paternal grandfather passed away from colon cancer. The patient's father had 3 brothers, one of whom had lung cancer.    GYNECOLOGIC HISTORY:  No LMP recorded. Patient has had a hysterectomy. Menarche: 53 years old Age at first live birth: 53 years old She is GXP2.  The patient is status post partial hysterectomy due to uterine fibroids. She retains her cervix and ovaries.  After her hysterectomy she used Estradiol with no complications.  She stopped hormone replacement March 2019.  She is now having hot flashes and nigh sweats that only allow her to sleep 3 hours at a time.    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 Her husband, [name], is a Hydrologist for alarms and cameras for [employer]. The patient's oldest son, Monica Padilla, is 1 and works as a Civil engineer, contracting in [place]. The patient's son, Monica Padilla, is 49 an works for a Dispensing optician. Monica Padilla lives at home with the patient. Monica Padilla does not have any grandchildren. She attends [employer].    ADVANCED DIRECTIVES:    HEALTH MAINTENANCE: Social History   Tobacco Use  . Smoking status: Never Smoker  . Smokeless tobacco: Never Used  Substance Use Topics  . Alcohol use: Yes    Comment:  occasional  . Drug use: No     Colonoscopy: 10/04/2013/ Dr. Carlean Purl / polyp removal  PAP:  Bone density: remote   Allergies  Allergen Reactions  . Codeine Itching  . Nitrofurantoin     REACTION: pruritis  . Amoxicillin Rash  . Pertuzumab Rash    Current Outpatient Medications  Medication Sig Dispense Refill  . Biotin 10000 MCG TABS Take 1 tablet by mouth daily.    . Black Elderberry 50 MG/5ML SYRP 1 tsp daily    . gabapentin (NEURONTIN) 300 MG capsule Take 300 mg by mouth once.    . lidocaine-prilocaine (EMLA) cream Apply to affected area once 30 g 3  . Multiple Vitamins-Minerals (WOMENS MULTI VITAMIN & MINERAL) TABS Take 1 each by mouth.    Marland Kitchen OVER THE COUNTER MEDICATION Whole Mega    . pantoprazole (  PROTONIX) 40 MG tablet Take by mouth. As needed    . Trastuzumab (HERCEPTIN IV) Inject into the vein every 21 ( twenty-one) days.     No current facility-administered medications for this visit.     OBJECTIVE: Young appearing white woman who appears well  Vitals:   03/03/19 1004  BP: (!) 135/91  Pulse: 62  Resp: 18  Temp: 98.1 F (36.7 C)  SpO2: 100%     Body mass index is 23.9 kg/m.   Wt Readings from Last 3 Encounters:  03/03/19 126 lb 8 oz (57.4 kg)  02/14/19 126 lb 9.6 oz (57.4 kg)  02/03/19 128 lb (58.1 kg)      ECOG FS:0 Sclerae unicteric, EOMs intact No cervical or supraclavicular adenopathy Lungs no rales or rhonchi Heart regular rate and rhythm Abd soft, nontender, positive bowel sounds MSK no focal spinal tenderness, no upper extremity lymphedema Neuro: nonfocal, well oriented, appropriate affect Breasts: The right breast is unremarkable.  The left breast is status post mastectomy and radiation.  There is no evidence of residual or recurrent disease.  Both axillae are benign.  LAB RESULTS:  CMP     Component Value Date/Time   NA 142 02/10/2019 1430   K 3.8 02/10/2019 1430   CL 105 02/10/2019 1430   CO2 30 02/10/2019 1430   GLUCOSE 116 (H)  02/10/2019 1430   BUN 9 02/10/2019 1430   CREATININE 0.76 02/10/2019 1430   CALCIUM 9.7 02/10/2019 1430   PROT 6.8 02/10/2019 1430   ALBUMIN 4.1 02/10/2019 1430   AST 20 02/10/2019 1430   ALT 18 02/10/2019 1430   ALKPHOS 104 02/10/2019 1430   BILITOT 0.4 02/10/2019 1430   GFRNONAA >60 02/10/2019 1430   GFRAA >60 02/10/2019 1430    No results found for: TOTALPROTELP, ALBUMINELP, A1GS, A2GS, BETS, BETA2SER, GAMS, MSPIKE, SPEI  No results found for: KPAFRELGTCHN, LAMBDASER, KAPLAMBRATIO  Lab Results  Component Value Date   WBC 3.5 (L) 03/03/2019   NEUTROABS 2.0 03/03/2019   HGB 12.5 03/03/2019   HCT 37.2 03/03/2019   MCV 91.4 03/03/2019   PLT 159 03/03/2019    '@LASTCHEMISTRY' @  No results found for: LABCA2  No components found for: GBEEFE071  No results for input(s): INR in the last 168 hours.  No results found for: LABCA2  No results found for: QRF758  No results found for: ITG549  No results found for: IYM415  No results found for: CA2729  No components found for: HGQUANT  No results found for: CEA1 / No results found for: CEA1   No results found for: AFPTUMOR  No results found for: CHROMOGRNA  No results found for: PSA1  Appointment on 03/03/2019  Component Date Value Ref Range Status  . WBC 03/03/2019 3.5* 4.0 - 10.5 K/uL Final  . RBC 03/03/2019 4.07  3.87 - 5.11 MIL/uL Final  . Hemoglobin 03/03/2019 12.5  12.0 - 15.0 g/dL Final  . HCT 03/03/2019 37.2  36.0 - 46.0 % Final  . MCV 03/03/2019 91.4  80.0 - 100.0 fL Final  . MCH 03/03/2019 30.7  26.0 - 34.0 pg Final  . MCHC 03/03/2019 33.6  30.0 - 36.0 g/dL Final  . RDW 03/03/2019 12.4  11.5 - 15.5 % Final  . Platelets 03/03/2019 159  150 - 400 K/uL Final  . nRBC 03/03/2019 0.0  0.0 - 0.2 % Final  . Neutrophils Relative % 03/03/2019 58  % Final  . Neutro Abs 03/03/2019 2.0  1.7 - 7.7 K/uL Final  .  Lymphocytes Relative 03/03/2019 31  % Final  . Lymphs Abs 03/03/2019 1.1  0.7 - 4.0 K/uL Final  .  Monocytes Relative 03/03/2019 7  % Final  . Monocytes Absolute 03/03/2019 0.3  0.1 - 1.0 K/uL Final  . Eosinophils Relative 03/03/2019 3  % Final  . Eosinophils Absolute 03/03/2019 0.1  0.0 - 0.5 K/uL Final  . Basophils Relative 03/03/2019 1  % Final  . Basophils Absolute 03/03/2019 0.0  0.0 - 0.1 K/uL Final  . Immature Granulocytes 03/03/2019 0  % Final  . Abs Immature Granulocytes 03/03/2019 0.01  0.00 - 0.07 K/uL Final   Performed at [facility], [place] [address] [place], [place] [address]    (this displays the last labs from the last 3 days)  No results found for: TOTALPROTELP, ALBUMINELP, A1GS, A2GS, BETS, BETA2SER, GAMS, MSPIKE, SPEI (this displays SPEP labs)  No results found for: KPAFRELGTCHN, LAMBDASER, KAPLAMBRATIO (kappa/lambda light chains)  No results found for: HGBA, HGBA2QUANT, HGBFQUANT, HGBSQUAN (Hemoglobinopathy evaluation)   No results found for: LDH  No results found for: IRON, TIBC, IRONPCTSAT (Iron and TIBC)  No results found for: FERRITIN  Urinalysis No results found for: COLORURINE, APPEARANCEUR, LABSPEC, PHURINE, GLUCOSEU, HGBUR, BILIRUBINUR, KETONESUR, PROTEINUR, UROBILINOGEN, NITRITE, LEUKOCYTESUR   STUDIES: No results found.    ELIGIBLE FOR AVAILABLE RESEARCH PROTOCOL: not discussed   ASSESSMENT: 53 y.o. [place], [place] woman status post left mastectomy and sentinel lymph node sampling 04/02/2018 for a pT2 pN1, stage IIB invasive ductal carcinoma, grade 3, estrogen and progesterone receptor negative, but HER-2 amplified  (a) not planning on reconstruction  (1) genetics testing 07/14/2018 through [facility] found no deleterious mutations in APC, ATM, AXIN2, BAP1, BARD1, BLM, BMPR1A, BRCA1, BRCA2, BRIP1, BUB1B, CDC73, CDH1, CDK4, CDKN1C, CDKN2A (p14ARF), CDKN2A (p16INK4a), CEP57, CHEK2, CTNNA1, DICER1, DIS3L2, ENG, EPCAM*, FH, FLCN, GALNT12, GPC3, GREM1*, KIT, MEN1, MET, MLH1, MLH3, MSH2, MSH3, MSH6, MUTYH, NBN, NF1, PALB2,  PDGFRA, PMS2, POLD1, POLE, PTEN, RAD50, RAD51C, RAD51D, RPS20, SDHB, SDHC, SDHD, SMAD4, SMARCA4, SMARCB1, STK11, TP53, TSC1, TSC2, VHL, WT1. The following genes were evaluated for sequence changes only: EGFR*, HOXB13*, NTHL1*, SDHA  (a) A variant of uncertain significance in [facility] was identified [id number] ([id number]  (2) adjuvant chemotherapy consisting of carboplatin and docetaxel given every 21 days x 6, started 05/13/2018, completed 08/26/2018  (3) trastuzumab and Pertuzumab started 05/13/2018; trastuzumab to be continued to complete a year  (a) pertuzumab discontinued after cycle 2 because of allergic reactions  (b) echocardiogram 05/06/2018 shows an ejection fraction in the 65-70%  (c) echocardiogram 08/12/2018 shows an ejection fraction in the 65-70%  (d) echocardiogram 11/10/2018 shows an ejection fraction in the 60-65% range  (e) echo 02/14/2019 shows an ejection fraction in the 60-65% range  (4) adjuvant radiation 09/06/18-11/01/18 Site/dose:  1. Left Axilla, 1.8 Gy in 25 fractions for a total of 45 Gy                     2. Left CW, 1.8 Gy in 28 fractions for a total of 50.4 Gy                     3. Boost, 2 Gy in 5 fractions for a total of 10 Gy  PLAN: Monica Padilla is tolerating the trastuzumab with no significant side effects.  She will receive her last treatment 05/05/2019.  She is already scheduled for an echocardiogram later that month.  She still has some discomfort associated with her surgery and radiation.  I have reassured her that this is not due to active cancer but to its treatment.  The same is true of her leukopenia, which is due to her earlier chemotherapy.  This is likely to be long-term and it does not mean that in case of a bacterial infection for example she would not mount a vigorous response  She has signed up for the finding your new normal group in April and I think she will greatly benefit from it.  She will see me again in June.  We will set her up for  long-term follow-up at that point  Magrinat, Virgie Dad, MD  03/03/19 10:31 AM Medical Oncology and Hematology [facility] [place], [place] [address] Tel. [phone]    Fax. [phone]   I, Jacqualyn Posey am acting as a Education administrator for Chauncey Cruel, MD.   I, Lurline Del MD, have reviewed the above documentation for accuracy and completeness, and I agree with the above.

## 2019-03-03 ENCOUNTER — Inpatient Hospital Stay: Payer: BC Managed Care – PPO | Admitting: Oncology

## 2019-03-03 ENCOUNTER — Inpatient Hospital Stay: Payer: BC Managed Care – PPO

## 2019-03-03 ENCOUNTER — Telehealth: Payer: Self-pay | Admitting: Oncology

## 2019-03-03 ENCOUNTER — Inpatient Hospital Stay: Payer: BC Managed Care – PPO | Attending: Oncology

## 2019-03-03 VITALS — BP 135/91 | HR 62 | Temp 98.1°F | Resp 18 | Ht 61.0 in | Wt 126.5 lb

## 2019-03-03 DIAGNOSIS — C50512 Malignant neoplasm of lower-outer quadrant of left female breast: Secondary | ICD-10-CM

## 2019-03-03 DIAGNOSIS — Z95828 Presence of other vascular implants and grafts: Secondary | ICD-10-CM

## 2019-03-03 DIAGNOSIS — Z800 Family history of malignant neoplasm of digestive organs: Secondary | ICD-10-CM

## 2019-03-03 DIAGNOSIS — T451X5A Adverse effect of antineoplastic and immunosuppressive drugs, initial encounter: Secondary | ICD-10-CM

## 2019-03-03 DIAGNOSIS — D701 Agranulocytosis secondary to cancer chemotherapy: Secondary | ICD-10-CM

## 2019-03-03 DIAGNOSIS — Z923 Personal history of irradiation: Secondary | ICD-10-CM

## 2019-03-03 DIAGNOSIS — Z5112 Encounter for antineoplastic immunotherapy: Secondary | ICD-10-CM | POA: Diagnosis present

## 2019-03-03 DIAGNOSIS — Z9012 Acquired absence of left breast and nipple: Secondary | ICD-10-CM

## 2019-03-03 DIAGNOSIS — Z171 Estrogen receptor negative status [ER-]: Secondary | ICD-10-CM

## 2019-03-03 DIAGNOSIS — Z9221 Personal history of antineoplastic chemotherapy: Secondary | ICD-10-CM

## 2019-03-03 DIAGNOSIS — Z803 Family history of malignant neoplasm of breast: Secondary | ICD-10-CM

## 2019-03-03 DIAGNOSIS — Z801 Family history of malignant neoplasm of trachea, bronchus and lung: Secondary | ICD-10-CM

## 2019-03-03 LAB — CBC WITH DIFFERENTIAL/PLATELET
Abs Immature Granulocytes: 0.01 10*3/uL (ref 0.00–0.07)
Basophils Absolute: 0 10*3/uL (ref 0.0–0.1)
Basophils Relative: 1 %
Eosinophils Absolute: 0.1 10*3/uL (ref 0.0–0.5)
Eosinophils Relative: 3 %
HCT: 37.2 % (ref 36.0–46.0)
HEMOGLOBIN: 12.5 g/dL (ref 12.0–15.0)
IMMATURE GRANULOCYTES: 0 %
LYMPHS PCT: 31 %
Lymphs Abs: 1.1 10*3/uL (ref 0.7–4.0)
MCH: 30.7 pg (ref 26.0–34.0)
MCHC: 33.6 g/dL (ref 30.0–36.0)
MCV: 91.4 fL (ref 80.0–100.0)
Monocytes Absolute: 0.3 10*3/uL (ref 0.1–1.0)
Monocytes Relative: 7 %
NEUTROS ABS: 2 10*3/uL (ref 1.7–7.7)
Neutrophils Relative %: 58 %
Platelets: 159 10*3/uL (ref 150–400)
RBC: 4.07 MIL/uL (ref 3.87–5.11)
RDW: 12.4 % (ref 11.5–15.5)
WBC: 3.5 10*3/uL — ABNORMAL LOW (ref 4.0–10.5)
nRBC: 0 % (ref 0.0–0.2)

## 2019-03-03 LAB — COMPREHENSIVE METABOLIC PANEL
ALT: 22 U/L (ref 0–44)
AST: 24 U/L (ref 15–41)
Albumin: 4.3 g/dL (ref 3.5–5.0)
Alkaline Phosphatase: 92 U/L (ref 38–126)
Anion gap: 6 (ref 5–15)
BUN: 11 mg/dL (ref 6–20)
CO2: 27 mmol/L (ref 22–32)
CREATININE: 0.6 mg/dL (ref 0.44–1.00)
Calcium: 9.8 mg/dL (ref 8.9–10.3)
Chloride: 106 mmol/L (ref 98–111)
GFR calc Af Amer: >60 mL/min (ref >60)
GFR calc non Af Amer: >60 mL/min (ref >60)
Glucose, Bld: 89 mg/dL (ref 70–99)
Potassium: 4 mmol/L (ref 3.5–5.1)
Sodium: 139 mmol/L (ref 135–145)
Total Bilirubin: 0.7 mg/dL (ref 0.3–1.2)
Total Protein: 7.2 g/dL (ref 6.5–8.1)

## 2019-03-03 MED ORDER — DIPHENHYDRAMINE HCL 25 MG PO CAPS
ORAL_CAPSULE | ORAL | Status: AC
  Filled 2019-03-03: qty 2

## 2019-03-03 MED ORDER — SODIUM CHLORIDE 0.9% FLUSH
10.0000 mL | INTRAVENOUS | Status: DC | PRN
  Administered 2019-03-03: 10 mL
  Filled 2019-03-03: qty 10

## 2019-03-03 MED ORDER — ACETAMINOPHEN 325 MG PO TABS
ORAL_TABLET | ORAL | Status: AC
  Filled 2019-03-03: qty 2

## 2019-03-03 MED ORDER — ACETAMINOPHEN 325 MG PO TABS
650.0000 mg | ORAL_TABLET | Freq: Once | ORAL | Status: AC
  Administered 2019-03-03: 650 mg via ORAL

## 2019-03-03 MED ORDER — HEPARIN SOD (PORK) LOCK FLUSH 100 UNIT/ML IV SOLN
500.0000 [IU] | Freq: Once | INTRAVENOUS | Status: AC | PRN
  Administered 2019-03-03: 500 [IU]
  Filled 2019-03-03: qty 5

## 2019-03-03 MED ORDER — SODIUM CHLORIDE 0.9 % IV SOLN
Freq: Once | INTRAVENOUS | Status: AC
  Administered 2019-03-03: 11:00:00 via INTRAVENOUS
  Filled 2019-03-03: qty 250

## 2019-03-03 MED ORDER — TRASTUZUMAB CHEMO 150 MG IV SOLR
6.0000 mg/kg | Freq: Once | INTRAVENOUS | Status: AC
  Administered 2019-03-03: 336 mg via INTRAVENOUS
  Filled 2019-03-03: qty 16

## 2019-03-03 MED ORDER — DIPHENHYDRAMINE HCL 25 MG PO CAPS
50.0000 mg | ORAL_CAPSULE | Freq: Once | ORAL | Status: AC
  Administered 2019-03-03: 50 mg via ORAL

## 2019-03-03 NOTE — Telephone Encounter (Signed)
Gave avs and calendar ° °

## 2019-03-03 NOTE — Patient Instructions (Signed)
[facility] Discharge Instructions for Patients Receiving Chemotherapy  Today you received the following chemotherapy agents Herceptin  To help prevent nausea and vomiting after your treatment, we encourage you to take your nausea medication as directed   If you develop nausea and vomiting that is not controlled by your nausea medication, call the clinic.   BELOW ARE SYMPTOMS THAT SHOULD BE REPORTED IMMEDIATELY:  *FEVER GREATER THAN 100.5 F  *CHILLS WITH OR WITHOUT FEVER  NAUSEA AND VOMITING THAT IS NOT CONTROLLED WITH YOUR NAUSEA MEDICATION  *UNUSUAL SHORTNESS OF BREATH  *UNUSUAL BRUISING OR BLEEDING  TENDERNESS IN MOUTH AND THROAT WITH OR WITHOUT PRESENCE OF ULCERS  *URINARY PROBLEMS  *BOWEL PROBLEMS  UNUSUAL RASH Items with * indicate a potential emergency and should be followed up as soon as possible.  Feel free to call the clinic should you have any questions or concerns. The clinic phone number is [phone].  Please show the CHEMO ALERT CARD at check-in to the Emergency Department and triage nurse.   

## 2019-03-07 ENCOUNTER — Ambulatory Visit
Admission: RE | Admit: 2019-03-07 | Discharge: 2019-03-07 | Disposition: A | Discharge status: Home / Self Care | Payer: BC Managed Care – PPO | Source: Ambulatory Visit | Attending: Radiation Oncology | Admitting: Radiation Oncology

## 2019-03-07 ENCOUNTER — Encounter: Payer: Self-pay | Admitting: Radiation Oncology

## 2019-03-07 ENCOUNTER — Other Ambulatory Visit: Payer: Self-pay

## 2019-03-07 VITALS — BP 132/79 | HR 58 | Temp 98.2°F | Resp 16 | Ht 61.0 in | Wt 127.0 lb

## 2019-03-07 DIAGNOSIS — Z79899 Other long term (current) drug therapy: Secondary | ICD-10-CM | POA: Diagnosis not present

## 2019-03-07 DIAGNOSIS — C50512 Malignant neoplasm of lower-outer quadrant of left female breast: Secondary | ICD-10-CM | POA: Diagnosis present

## 2019-03-07 DIAGNOSIS — Z171 Estrogen receptor negative status [ER-]: Secondary | ICD-10-CM | POA: Diagnosis not present

## 2019-03-07 DIAGNOSIS — Z9012 Acquired absence of left breast and nipple: Secondary | ICD-10-CM | POA: Insufficient documentation

## 2019-03-07 DIAGNOSIS — Z923 Personal history of irradiation: Secondary | ICD-10-CM | POA: Insufficient documentation

## 2019-03-07 NOTE — Progress Notes (Signed)
Pt presents today for f/u with Dr. Sondra Come. Pt is unaccompanied. Pt reports fatigue has resolved. Pt states that superior to mastectomy scar is still tender to touch. Pt uses OTC lotion on breast and occasionally applies vitamin E oil.   BP 132/79 (BP Location: Right Arm, Patient Position: Sitting)   Pulse (!) 58   Temp 98.2 F (36.8 C) (Oral)   Resp 16   Ht 5\' 1"  (1.549 m)   Wt 127 lb (57.6 kg)   SpO2 100%   BMI 24.00 kg/m   Wt Readings from Last 3 Encounters:  03/07/19 127 lb (57.6 kg)  03/03/19 126 lb 8 oz (57.4 kg)  02/14/19 126 lb 9.6 oz (57.4 kg)   Loma Sousa, RN BSN

## 2019-03-07 NOTE — Progress Notes (Signed)
Radiation Oncology         [phone] ________________________________  Name: Monica Padilla MRN: [id number]  Date: 03/07/2019  DOB: 1966-07-17  Follow-Up Visit Note  CC: Monica Greenhouse, MD  Monica Padilla, Monica Dad, MD    ICD-10-CM   1. Malignant neoplasm of lower-outer quadrant of left breast of female, estrogen receptor negative ([facility]) C50.512    Z17.1     Diagnosis: Pathologic stage:pT2, pN1a (sn),LeftBreast, LOQ, Invasive Ductal Carcinoma, ER(-), PR(-), HER2(+), grade3     Interval Since Last Radiation:  4 months  Radiation treatment dates:   09/06/18-11/01/18  Site/dose:  1. Left Axilla, 1.8 Gy in 25 fractions for a total of 45 Gy                     2. Left CW, 1.8 Gy in 28 fractions for a total of 50.4 Gy                     3. Mastectomy scar Boost, 2 Gy in 5 fractions for a total of 10 Gy   Narrative:  The patient returns today for routine follow-up.  she is doing well overall.  She reports continuing on Herceptin but finishes May 7. She had a cruise scheduled for May with girlfriends but they are canceling it due to COVID-19 concerns. She has another trip planned in August to [place].                 On review of systems, she reports mild soreness to her left chest wall when palpated. She has improved range of motion in her left shoulder.  she denies swelling in her left arm and any other symptoms. Pertinent positives are listed and detailed within the above HPI.                 ALLERGIES:  is allergic to codeine; nitrofurantoin; amoxicillin; and pertuzumab.  Meds: Current Outpatient Medications  Medication Sig Dispense Refill  . Biotin 10000 MCG TABS Take 1 tablet by mouth daily.    . Black Elderberry 50 MG/5ML SYRP 1 tsp daily    . gabapentin (NEURONTIN) 300 MG capsule Take 300 mg by mouth once.    . lidocaine-prilocaine (EMLA) cream Apply to affected area once 30 g 3  . Multiple Vitamins-Minerals (WOMENS MULTI VITAMIN & MINERAL) TABS Take 1 each by mouth.    Marland Kitchen  OVER THE COUNTER MEDICATION Whole Mega    . pantoprazole (PROTONIX) 40 MG tablet Take by mouth. As needed    . Trastuzumab (HERCEPTIN IV) Inject into the vein every 21 ( twenty-one) days.     No current facility-administered medications for this encounter.     Physical Findings: The patient is in no acute distress. Patient is alert and oriented.  height is '5\' 1"'  (1.549 m) and weight is 127 lb (57.6 kg). Her oral temperature is 98.2 F (36.8 C). Her blood pressure is 132/79 and her pulse is 58 (abnormal). Her respiration is 16 and oxygen saturation is 100%. .  No significant changes. Lungs are clear to auscultation bilaterally. Heart has regular rate and rhythm. No palpable cervical, supraclavicular, or axillary adenopathy. Abdomen soft, non-tender, normal bowel sounds. Right breast with no palpable mass, nipple discharge or bleeding. Left chest wall patient has some mild hyperpigmentation changes. Mild swelling in her left chest wall where she is tender. No mass in this area.Patient's mastectomy scar is well-healed.   Lab Findings: Lab Results  Component Value  Date   WBC 3.5 (L) 03/03/2019   HGB 12.5 03/03/2019   HCT 37.2 03/03/2019   MCV 91.4 03/03/2019   PLT 159 03/03/2019    Radiographic Findings: No results found.  Impression:  No evidence of recurrence on clinical exam.   Plan: In light of the patient's close follow-up with Dr. Jana Hakim and Dr. Noberto Retort I have not scheduled her for formal follow-up, but would be glad to see her at any time. ____________________________________   Blair Promise, PhD, MD    This document serves as a record of services personally performed by Gery Pray, MD. It was created on his behalf by Mary-Margaret Loma Messing, a trained medical scribe. The creation of this record is based on the scribe's personal observations and the provider's statements to them. This document has been checked and approved by the attending provider.

## 2019-03-21 ENCOUNTER — Encounter: Payer: Self-pay | Admitting: *Deleted

## 2019-03-24 ENCOUNTER — Inpatient Hospital Stay: Payer: BC Managed Care – PPO

## 2019-03-24 ENCOUNTER — Other Ambulatory Visit: Payer: Self-pay

## 2019-03-24 VITALS — BP 139/82 | HR 63 | Temp 97.8°F | Resp 17 | Ht 61.0 in | Wt 127.2 lb

## 2019-03-24 DIAGNOSIS — C50512 Malignant neoplasm of lower-outer quadrant of left female breast: Secondary | ICD-10-CM

## 2019-03-24 DIAGNOSIS — Z171 Estrogen receptor negative status [ER-]: Principal | ICD-10-CM

## 2019-03-24 DIAGNOSIS — Z95828 Presence of other vascular implants and grafts: Secondary | ICD-10-CM

## 2019-03-24 LAB — CBC WITH DIFFERENTIAL/PLATELET
Abs Immature Granulocytes: 0 10*3/uL (ref 0.00–0.07)
Basophils Absolute: 0 10*3/uL (ref 0.0–0.1)
Basophils Relative: 1 %
EOS ABS: 0.2 10*3/uL (ref 0.0–0.5)
Eosinophils Relative: 5 %
HCT: 37 % (ref 36.0–46.0)
Hemoglobin: 12.2 g/dL (ref 12.0–15.0)
Immature Granulocytes: 0 %
Lymphocytes Relative: 27 %
Lymphs Abs: 1 10*3/uL (ref 0.7–4.0)
MCH: 30.5 pg (ref 26.0–34.0)
MCHC: 33 g/dL (ref 30.0–36.0)
MCV: 92.5 fL (ref 80.0–100.0)
Monocytes Absolute: 0.3 10*3/uL (ref 0.1–1.0)
Monocytes Relative: 8 %
Neutro Abs: 2.2 10*3/uL (ref 1.7–7.7)
Neutrophils Relative %: 59 %
Platelets: 140 10*3/uL — ABNORMAL LOW (ref 150–400)
RBC: 4 MIL/uL (ref 3.87–5.11)
RDW: 12.4 % (ref 11.5–15.5)
WBC: 3.7 10*3/uL — ABNORMAL LOW (ref 4.0–10.5)
nRBC: 0 % (ref 0.0–0.2)

## 2019-03-24 LAB — COMPREHENSIVE METABOLIC PANEL
ALT: 19 U/L (ref 0–44)
ANION GAP: 9 (ref 5–15)
AST: 20 U/L (ref 15–41)
Albumin: 3.9 g/dL (ref 3.5–5.0)
Alkaline Phosphatase: 97 U/L (ref 38–126)
BUN: 11 mg/dL (ref 6–20)
CO2: 26 mmol/L (ref 22–32)
CREATININE: 0.73 mg/dL (ref 0.44–1.00)
Calcium: 9.5 mg/dL (ref 8.9–10.3)
Chloride: 106 mmol/L (ref 98–111)
GFR calc Af Amer: >60 mL/min (ref >60)
GFR calc non Af Amer: >60 mL/min (ref >60)
Glucose, Bld: 91 mg/dL (ref 70–99)
Potassium: 4.3 mmol/L (ref 3.5–5.1)
Sodium: 141 mmol/L (ref 135–145)
Total Bilirubin: 0.3 mg/dL (ref 0.3–1.2)
Total Protein: 6.7 g/dL (ref 6.5–8.1)

## 2019-03-24 MED ORDER — DIPHENHYDRAMINE HCL 25 MG PO CAPS
ORAL_CAPSULE | ORAL | Status: AC
  Filled 2019-03-24: qty 1

## 2019-03-24 MED ORDER — SODIUM CHLORIDE 0.9 % IV SOLN
Freq: Once | INTRAVENOUS | Status: AC
  Administered 2019-03-24: 08:00:00 via INTRAVENOUS
  Filled 2019-03-24: qty 250

## 2019-03-24 MED ORDER — SODIUM CHLORIDE 0.9% FLUSH
10.0000 mL | INTRAVENOUS | Status: DC | PRN
  Administered 2019-03-24: 10 mL
  Filled 2019-03-24: qty 10

## 2019-03-24 MED ORDER — DIPHENHYDRAMINE HCL 25 MG PO CAPS
25.0000 mg | ORAL_CAPSULE | Freq: Once | ORAL | Status: AC
  Administered 2019-03-24: 25 mg via ORAL

## 2019-03-24 MED ORDER — HEPARIN SOD (PORK) LOCK FLUSH 100 UNIT/ML IV SOLN
500.0000 [IU] | Freq: Once | INTRAVENOUS | Status: AC | PRN
  Administered 2019-03-24: 500 [IU]
  Filled 2019-03-24: qty 5

## 2019-03-24 MED ORDER — TRASTUZUMAB CHEMO 150 MG IV SOLR
6.0000 mg/kg | Freq: Once | INTRAVENOUS | Status: AC
  Administered 2019-03-24: 336 mg via INTRAVENOUS
  Filled 2019-03-24: qty 16

## 2019-03-24 MED ORDER — ACETAMINOPHEN 325 MG PO TABS
650.0000 mg | ORAL_TABLET | Freq: Once | ORAL | Status: AC
  Administered 2019-03-24: 650 mg via ORAL

## 2019-03-24 MED ORDER — ACETAMINOPHEN 325 MG PO TABS
ORAL_TABLET | ORAL | Status: AC
  Filled 2019-03-24: qty 2

## 2019-03-24 NOTE — Patient Instructions (Signed)
Coronavirus (COVID-19) Are you at risk?  Are you at risk for the Coronavirus (COVID-19)?  To be considered HIGH RISK for Coronavirus (COVID-19), you have to meet the following criteria:  . Traveled to [place], [place], [place], [place] or [place]; or in the [place] to [place], [place], [place], or [place]; and have fever, cough, and shortness of breath within the last 2 weeks of travel OR . Been in close contact with a person diagnosed with COVID-19 within the last 2 weeks and have fever, cough, and shortness of breath . IF YOU DO NOT MEET THESE CRITERIA, YOU ARE CONSIDERED LOW RISK FOR COVID-19.  What to do if you are HIGH RISK for COVID-19?  . If you are having a medical emergency, call 911. . Seek medical care right away. Before you go to a doctor's office, urgent care or emergency department, call ahead and tell them about your recent travel, contact with someone diagnosed with COVID-19, and your symptoms. You should receive instructions from your physician's office regarding next steps of care.  . When you arrive at healthcare provider, tell the healthcare staff immediately you have returned from visiting [place], [place], [place], [place] or [place]; or traveled in the [place] to [place], [place], [place], or [place]; in the last two weeks or you have been in close contact with a person diagnosed with COVID-19 in the last 2 weeks.   . Tell the health care staff about your symptoms: fever, cough and shortness of breath. . After you have been seen by a medical provider, you will be either: o Tested for (COVID-19) and discharged home on quarantine except to seek medical care if symptoms worsen, and asked to  - Stay home and avoid contact with others until you get your results (4-5 days)  - Avoid travel on public transportation if possible (such as bus, train, or airplane) or o Sent to the Emergency Department by EMS for evaluation, COVID-19 testing, and possible  admission depending on your condition and test results.  What to do if you are LOW RISK for COVID-19?  Reduce your risk of any infection by using the same precautions used for avoiding the common cold or flu:  . Wash your hands often with soap and warm water for at least 20 seconds.  If soap and water are not readily available, use an alcohol-based hand sanitizer with at least 60% alcohol.  . If coughing or sneezing, cover your mouth and nose by coughing or sneezing into the elbow areas of your shirt or coat, into a tissue or into your sleeve (not your hands). . Avoid shaking hands with others and consider head nods or verbal greetings only. . Avoid touching your eyes, nose, or mouth with unwashed hands.  . Avoid close contact with people who are sick. . Avoid places or events with large numbers of people in one location, like concerts or sporting events. . Carefully consider travel plans you have or are making. . If you are planning any travel outside or inside the [place], visit the CDC's Travelers' Health webpage for the latest health notices. . If you have some symptoms but not all symptoms, continue to monitor at home and seek medical attention if your symptoms worsen. . If you are having a medical emergency, call 911.  ADDITIONAL HEALTHCARE OPTIONS FOR PATIENTS  [facility] / e-Visit: [email or web address]         [facility]: [phone]  [facility]: [facility]: [facility] Discharge Instructions for Patients Receiving Chemotherapy  Today you received the following chemotherapy agents: Trastuzumab (Herceptin)  To help prevent nausea and vomiting after your treatment, we encourage you to take your nausea medication as directed.    If you develop nausea and vomiting that is not controlled by your nausea medication, call the clinic.    BELOW ARE SYMPTOMS THAT SHOULD BE REPORTED IMMEDIATELY:  *FEVER GREATER THAN 100.5 F  *CHILLS WITH OR WITHOUT FEVER  NAUSEA AND VOMITING THAT IS NOT CONTROLLED WITH YOUR NAUSEA MEDICATION  *UNUSUAL SHORTNESS OF BREATH  *UNUSUAL BRUISING OR BLEEDING  TENDERNESS IN MOUTH AND THROAT WITH OR WITHOUT PRESENCE OF ULCERS  *URINARY PROBLEMS  *BOWEL PROBLEMS  UNUSUAL RASH Items with * indicate a potential emergency and should be followed up as soon as possible.  Feel free to call the clinic should you have any questions or concerns. The clinic phone number is [phone].  Please show the [facility] at check-in to the Emergency Department and triage nurse.

## 2019-03-24 NOTE — Progress Notes (Signed)
Spoke with MD. Continue Herceptin 6mg /kg q 3weeks over 30 minutes.  B.Kynnedy Carreno, PharmD, BCPS, BCOP

## 2019-04-14 ENCOUNTER — Inpatient Hospital Stay: Payer: BC Managed Care – PPO

## 2019-04-14 ENCOUNTER — Inpatient Hospital Stay: Payer: BC Managed Care – PPO | Attending: Oncology

## 2019-04-14 ENCOUNTER — Other Ambulatory Visit: Payer: Self-pay

## 2019-04-14 VITALS — BP 141/92 | HR 63 | Temp 98.0°F | Resp 17 | Ht 61.0 in | Wt 126.4 lb

## 2019-04-14 DIAGNOSIS — Z171 Estrogen receptor negative status [ER-]: Secondary | ICD-10-CM

## 2019-04-14 DIAGNOSIS — Z5112 Encounter for antineoplastic immunotherapy: Secondary | ICD-10-CM | POA: Diagnosis present

## 2019-04-14 DIAGNOSIS — Z95828 Presence of other vascular implants and grafts: Secondary | ICD-10-CM

## 2019-04-14 DIAGNOSIS — C50512 Malignant neoplasm of lower-outer quadrant of left female breast: Secondary | ICD-10-CM | POA: Diagnosis present

## 2019-04-14 LAB — CBC WITH DIFFERENTIAL/PLATELET
Abs Immature Granulocytes: 0.01 10*3/uL (ref 0.00–0.07)
Basophils Absolute: 0 10*3/uL (ref 0.0–0.1)
Basophils Relative: 1 %
Eosinophils Absolute: 0.3 10*3/uL (ref 0.0–0.5)
Eosinophils Relative: 7 %
HCT: 36.2 % (ref 36.0–46.0)
Hemoglobin: 12 g/dL (ref 12.0–15.0)
Immature Granulocytes: 0 %
Lymphocytes Relative: 32 %
Lymphs Abs: 1.1 10*3/uL (ref 0.7–4.0)
MCH: 29.9 pg (ref 26.0–34.0)
MCHC: 33.1 g/dL (ref 30.0–36.0)
MCV: 90.3 fL (ref 80.0–100.0)
Monocytes Absolute: 0.3 10*3/uL (ref 0.1–1.0)
Monocytes Relative: 7 %
Neutro Abs: 1.9 10*3/uL (ref 1.7–7.7)
Neutrophils Relative %: 53 %
Platelets: 139 10*3/uL — ABNORMAL LOW (ref 150–400)
RBC: 4.01 MIL/uL (ref 3.87–5.11)
RDW: 12.3 % (ref 11.5–15.5)
WBC: 3.6 10*3/uL — ABNORMAL LOW (ref 4.0–10.5)
nRBC: 0 % (ref 0.0–0.2)

## 2019-04-14 LAB — COMPREHENSIVE METABOLIC PANEL
ALT: 17 U/L (ref 0–44)
AST: 23 U/L (ref 15–41)
Albumin: 4.1 g/dL (ref 3.5–5.0)
Alkaline Phosphatase: 92 U/L (ref 38–126)
Anion gap: 10 (ref 5–15)
BUN: 10 mg/dL (ref 6–20)
CO2: 25 mmol/L (ref 22–32)
Calcium: 9.6 mg/dL (ref 8.9–10.3)
Chloride: 106 mmol/L (ref 98–111)
Creatinine, Ser: 0.73 mg/dL (ref 0.44–1.00)
GFR calc Af Amer: >60 mL/min (ref >60)
GFR calc non Af Amer: >60 mL/min (ref >60)
Glucose, Bld: 90 mg/dL (ref 70–99)
Potassium: 4 mmol/L (ref 3.5–5.1)
Sodium: 141 mmol/L (ref 135–145)
Total Bilirubin: 0.4 mg/dL (ref 0.3–1.2)
Total Protein: 7 g/dL (ref 6.5–8.1)

## 2019-04-14 MED ORDER — SODIUM CHLORIDE 0.9% FLUSH
10.0000 mL | INTRAVENOUS | Status: DC | PRN
  Administered 2019-04-14: 10 mL
  Filled 2019-04-14: qty 10

## 2019-04-14 MED ORDER — TRASTUZUMAB CHEMO 150 MG IV SOLR
6.0000 mg/kg | Freq: Once | INTRAVENOUS | Status: AC
  Administered 2019-04-14: 336 mg via INTRAVENOUS
  Filled 2019-04-14: qty 16

## 2019-04-14 MED ORDER — HEPARIN SOD (PORK) LOCK FLUSH 100 UNIT/ML IV SOLN
500.0000 [IU] | Freq: Once | INTRAVENOUS | Status: AC | PRN
  Administered 2019-04-14: 11:00:00 500 [IU]
  Filled 2019-04-14: qty 5

## 2019-04-14 MED ORDER — ACETAMINOPHEN 325 MG PO TABS
ORAL_TABLET | ORAL | Status: AC
  Filled 2019-04-14: qty 2

## 2019-04-14 MED ORDER — DIPHENHYDRAMINE HCL 25 MG PO CAPS
ORAL_CAPSULE | ORAL | Status: AC
  Filled 2019-04-14: qty 2

## 2019-04-14 MED ORDER — ACETAMINOPHEN 325 MG PO TABS
650.0000 mg | ORAL_TABLET | Freq: Once | ORAL | Status: AC
  Administered 2019-04-14: 650 mg via ORAL

## 2019-04-14 MED ORDER — DIPHENHYDRAMINE HCL 25 MG PO CAPS
50.0000 mg | ORAL_CAPSULE | Freq: Once | ORAL | Status: AC
  Administered 2019-04-14: 50 mg via ORAL

## 2019-04-14 MED ORDER — SODIUM CHLORIDE 0.9 % IV SOLN
Freq: Once | INTRAVENOUS | Status: AC
  Administered 2019-04-14: 09:00:00 via INTRAVENOUS
  Filled 2019-04-14: qty 250

## 2019-04-14 NOTE — Patient Instructions (Signed)
[facility] Discharge Instructions for Patients Receiving Chemotherapy  Today you received the following chemotherapy agents Trastuzumab (HERCEPTIN).  To help prevent nausea and vomiting after your treatment, we encourage you to take your nausea medication as prescribed.  If you develop nausea and vomiting that is not controlled by your nausea medication, call the clinic.   BELOW ARE SYMPTOMS THAT SHOULD BE REPORTED IMMEDIATELY:  *FEVER GREATER THAN 100.5 F  *CHILLS WITH OR WITHOUT FEVER  NAUSEA AND VOMITING THAT IS NOT CONTROLLED WITH YOUR NAUSEA MEDICATION  *UNUSUAL SHORTNESS OF BREATH  *UNUSUAL BRUISING OR BLEEDING  TENDERNESS IN MOUTH AND THROAT WITH OR WITHOUT PRESENCE OF ULCERS  *URINARY PROBLEMS  *BOWEL PROBLEMS  UNUSUAL RASH Items with * indicate a potential emergency and should be followed up as soon as possible.  Feel free to call the clinic should you have any questions or concerns. The clinic phone number is [phone].  Please show the CHEMO ALERT CARD at check-in to the Emergency Department and triage nurse.  Coronavirus (COVID-19) Are you at risk?  Are you at risk for the Coronavirus (COVID-19)?  To be considered HIGH RISK for Coronavirus (COVID-19), you have to meet the following criteria:  . Traveled to [place], [place], [place], [place] or [place]; or in the [place] to [place], [place], [place], or [place]; and have fever, cough, and shortness of breath within the last 2 weeks of travel OR . Been in close contact with a person diagnosed with COVID-19 within the last 2 weeks and have fever, cough, and shortness of breath . IF YOU DO NOT MEET THESE CRITERIA, YOU ARE CONSIDERED LOW RISK FOR COVID-19.  What to do if you are HIGH RISK for COVID-19?  . If you are having a medical emergency, call 911. . Seek medical care right away. Before you go to a doctor's office, urgent care or emergency department, call ahead and tell them  about your recent travel, contact with someone diagnosed with COVID-19, and your symptoms. You should receive instructions from your physician's office regarding next steps of care.  . When you arrive at healthcare provider, tell the healthcare staff immediately you have returned from visiting [place], [place], [place], [place] or [place]; or traveled in the [place] to [place], [place], [place], or [place]; in the last two weeks or you have been in close contact with a person diagnosed with COVID-19 in the last 2 weeks.   . Tell the health care staff about your symptoms: fever, cough and shortness of breath. . After you have been seen by a medical provider, you will be either: o Tested for (COVID-19) and discharged home on quarantine except to seek medical care if symptoms worsen, and asked to  - Stay home and avoid contact with others until you get your results (4-5 days)  - Avoid travel on public transportation if possible (such as bus, train, or airplane) or o Sent to the Emergency Department by EMS for evaluation, COVID-19 testing, and possible admission depending on your condition and test results.  What to do if you are LOW RISK for COVID-19?  Reduce your risk of any infection by using the same precautions used for avoiding the common cold or flu:  . Wash your hands often with soap and warm water for at least 20 seconds.  If soap and water are not readily available, use an alcohol-based hand sanitizer with at least 60% alcohol.  . If coughing or sneezing,   cover your mouth and nose by coughing or sneezing into the elbow areas of your shirt or coat, into a tissue or into your sleeve (not your hands). . Avoid shaking hands with others and consider head nods or verbal greetings only. . Avoid touching your eyes, nose, or mouth with unwashed hands.  . Avoid close contact with people who are sick. . Avoid places or events with large numbers of people in one location, like concerts or  sporting events. . Carefully consider travel plans you have or are making. . If you are planning any travel outside or inside the [place], visit the CDC's Travelers' Health webpage for the latest health notices. . If you have some symptoms but not all symptoms, continue to monitor at home and seek medical attention if your symptoms worsen. . If you are having a medical emergency, call 911.   ADDITIONAL HEALTHCARE OPTIONS FOR PATIENTS  [facility] / e-Visit: [email or web address]         [facility]: [phone]  [facility]: [phone]                   [facility]: [phone]   

## 2019-05-02 ENCOUNTER — Telehealth (HOSPITAL_COMMUNITY): Payer: Self-pay | Admitting: Vascular Surgery

## 2019-05-02 NOTE — Telephone Encounter (Signed)
Left pt message to reschedule echo @ [facility] / make televist w/ db

## 2019-05-05 ENCOUNTER — Telehealth: Payer: Self-pay | Admitting: *Deleted

## 2019-05-05 ENCOUNTER — Inpatient Hospital Stay: Payer: BC Managed Care – PPO

## 2019-05-05 ENCOUNTER — Other Ambulatory Visit: Payer: Self-pay

## 2019-05-05 ENCOUNTER — Inpatient Hospital Stay: Payer: BC Managed Care – PPO | Attending: Oncology

## 2019-05-05 VITALS — BP 126/74 | HR 62 | Temp 98.1°F | Resp 18

## 2019-05-05 DIAGNOSIS — Z5112 Encounter for antineoplastic immunotherapy: Secondary | ICD-10-CM | POA: Diagnosis not present

## 2019-05-05 DIAGNOSIS — Z171 Estrogen receptor negative status [ER-]: Principal | ICD-10-CM

## 2019-05-05 DIAGNOSIS — C50512 Malignant neoplasm of lower-outer quadrant of left female breast: Secondary | ICD-10-CM

## 2019-05-05 DIAGNOSIS — Z95828 Presence of other vascular implants and grafts: Secondary | ICD-10-CM

## 2019-05-05 LAB — CBC WITH DIFFERENTIAL/PLATELET
Abs Immature Granulocytes: 0 10*3/uL (ref 0.00–0.07)
Basophils Absolute: 0 10*3/uL (ref 0.0–0.1)
Basophils Relative: 1 %
Eosinophils Absolute: 0.1 10*3/uL (ref 0.0–0.5)
Eosinophils Relative: 4 %
HCT: 35.5 % — ABNORMAL LOW (ref 36.0–46.0)
Hemoglobin: 11.8 g/dL — ABNORMAL LOW (ref 12.0–15.0)
Immature Granulocytes: 0 %
Lymphocytes Relative: 27 %
Lymphs Abs: 0.9 10*3/uL (ref 0.7–4.0)
MCH: 30.5 pg (ref 26.0–34.0)
MCHC: 33.2 g/dL (ref 30.0–36.0)
MCV: 91.7 fL (ref 80.0–100.0)
Monocytes Absolute: 0.3 10*3/uL (ref 0.1–1.0)
Monocytes Relative: 8 %
Neutro Abs: 2 10*3/uL (ref 1.7–7.7)
Neutrophils Relative %: 60 %
Platelets: 150 10*3/uL (ref 150–400)
RBC: 3.87 MIL/uL (ref 3.87–5.11)
RDW: 12.2 % (ref 11.5–15.5)
WBC: 3.3 10*3/uL — ABNORMAL LOW (ref 4.0–10.5)
nRBC: 0 % (ref 0.0–0.2)

## 2019-05-05 LAB — COMPREHENSIVE METABOLIC PANEL
ALT: 19 U/L (ref 0–44)
AST: 19 U/L (ref 15–41)
Albumin: 4 g/dL (ref 3.5–5.0)
Alkaline Phosphatase: 99 U/L (ref 38–126)
Anion gap: 8 (ref 5–15)
BUN: 13 mg/dL (ref 6–20)
CO2: 25 mmol/L (ref 22–32)
Calcium: 9.5 mg/dL (ref 8.9–10.3)
Chloride: 109 mmol/L (ref 98–111)
Creatinine, Ser: 0.74 mg/dL (ref 0.44–1.00)
GFR calc Af Amer: >60 mL/min (ref >60)
GFR calc non Af Amer: >60 mL/min (ref >60)
Glucose, Bld: 86 mg/dL (ref 70–99)
Potassium: 4.3 mmol/L (ref 3.5–5.1)
Sodium: 142 mmol/L (ref 135–145)
Total Bilirubin: 0.3 mg/dL (ref 0.3–1.2)
Total Protein: 6.8 g/dL (ref 6.5–8.1)

## 2019-05-05 MED ORDER — ACETAMINOPHEN 325 MG PO TABS
ORAL_TABLET | ORAL | Status: AC
  Filled 2019-05-05: qty 2

## 2019-05-05 MED ORDER — TRASTUZUMAB CHEMO 150 MG IV SOLR
6.0000 mg/kg | Freq: Once | INTRAVENOUS | Status: AC
  Administered 2019-05-05: 336 mg via INTRAVENOUS
  Filled 2019-05-05: qty 16

## 2019-05-05 MED ORDER — ACETAMINOPHEN 325 MG PO TABS
650.0000 mg | ORAL_TABLET | Freq: Once | ORAL | Status: AC
  Administered 2019-05-05: 650 mg via ORAL

## 2019-05-05 MED ORDER — SODIUM CHLORIDE 0.9% FLUSH
10.0000 mL | INTRAVENOUS | Status: DC | PRN
  Administered 2019-05-05: 11:00:00 10 mL
  Filled 2019-05-05: qty 10

## 2019-05-05 MED ORDER — ACETAMINOPHEN 500 MG PO TABS
ORAL_TABLET | ORAL | Status: AC
  Filled 2019-05-05: qty 2

## 2019-05-05 MED ORDER — HEPARIN SOD (PORK) LOCK FLUSH 100 UNIT/ML IV SOLN
500.0000 [IU] | Freq: Once | INTRAVENOUS | Status: AC | PRN
  Administered 2019-05-05: 11:00:00 500 [IU]
  Filled 2019-05-05: qty 5

## 2019-05-05 MED ORDER — DIPHENHYDRAMINE HCL 25 MG PO CAPS
50.0000 mg | ORAL_CAPSULE | Freq: Once | ORAL | Status: AC
  Administered 2019-05-05: 50 mg via ORAL

## 2019-05-05 MED ORDER — SODIUM CHLORIDE 0.9 % IV SOLN
Freq: Once | INTRAVENOUS | Status: AC
  Administered 2019-05-05: 10:00:00 via INTRAVENOUS
  Filled 2019-05-05: qty 250

## 2019-05-05 MED ORDER — DIPHENHYDRAMINE HCL 25 MG PO CAPS
ORAL_CAPSULE | ORAL | Status: AC
  Filled 2019-05-05: qty 2

## 2019-05-05 MED ORDER — SODIUM CHLORIDE 0.9% FLUSH
10.0000 mL | INTRAVENOUS | Status: DC | PRN
  Administered 2019-05-05: 10 mL
  Filled 2019-05-05: qty 10

## 2019-05-05 NOTE — Patient Instructions (Signed)
[facility] Discharge Instructions for Patients Receiving Chemotherapy  Today you received the following chemotherapy agents Trastuzumab (HERCEPTIN).  To help prevent nausea and vomiting after your treatment, we encourage you to take your nausea medication as prescribed.  If you develop nausea and vomiting that is not controlled by your nausea medication, call the clinic.   BELOW ARE SYMPTOMS THAT SHOULD BE REPORTED IMMEDIATELY:  *FEVER GREATER THAN 100.5 F  *CHILLS WITH OR WITHOUT FEVER  NAUSEA AND VOMITING THAT IS NOT CONTROLLED WITH YOUR NAUSEA MEDICATION  *UNUSUAL SHORTNESS OF BREATH  *UNUSUAL BRUISING OR BLEEDING  TENDERNESS IN MOUTH AND THROAT WITH OR WITHOUT PRESENCE OF ULCERS  *URINARY PROBLEMS  *BOWEL PROBLEMS  UNUSUAL RASH Items with * indicate a potential emergency and should be followed up as soon as possible.  Feel free to call the clinic should you have any questions or concerns. The clinic phone number is [phone].  Please show the CHEMO ALERT CARD at check-in to the Emergency Department and triage nurse.  Coronavirus (COVID-19) Are you at risk?  Are you at risk for the Coronavirus (COVID-19)?  To be considered HIGH RISK for Coronavirus (COVID-19), you have to meet the following criteria:  . Traveled to [place], [place], [place], [place] or [place]; or in the [place] to [place], [place], [place], or [place]; and have fever, cough, and shortness of breath within the last 2 weeks of travel OR . Been in close contact with a person diagnosed with COVID-19 within the last 2 weeks and have fever, cough, and shortness of breath . IF YOU DO NOT MEET THESE CRITERIA, YOU ARE CONSIDERED LOW RISK FOR COVID-19.  What to do if you are HIGH RISK for COVID-19?  . If you are having a medical emergency, call 911. . Seek medical care right away. Before you go to a doctor's office, urgent care or emergency department, call ahead and tell them  about your recent travel, contact with someone diagnosed with COVID-19, and your symptoms. You should receive instructions from your physician's office regarding next steps of care.  . When you arrive at healthcare provider, tell the healthcare staff immediately you have returned from visiting [place], [place], [place], [place] or [place]; or traveled in the [place] to [place], [place], [place], or [place]; in the last two weeks or you have been in close contact with a person diagnosed with COVID-19 in the last 2 weeks.   . Tell the health care staff about your symptoms: fever, cough and shortness of breath. . After you have been seen by a medical provider, you will be either: o Tested for (COVID-19) and discharged home on quarantine except to seek medical care if symptoms worsen, and asked to  - Stay home and avoid contact with others until you get your results (4-5 days)  - Avoid travel on public transportation if possible (such as bus, train, or airplane) or o Sent to the Emergency Department by EMS for evaluation, COVID-19 testing, and possible admission depending on your condition and test results.  What to do if you are LOW RISK for COVID-19?  Reduce your risk of any infection by using the same precautions used for avoiding the common cold or flu:  . Wash your hands often with soap and warm water for at least 20 seconds.  If soap and water are not readily available, use an alcohol-based hand sanitizer with at least 60% alcohol.  . If coughing or sneezing,   cover your mouth and nose by coughing or sneezing into the elbow areas of your shirt or coat, into a tissue or into your sleeve (not your hands). . Avoid shaking hands with others and consider head nods or verbal greetings only. . Avoid touching your eyes, nose, or mouth with unwashed hands.  . Avoid close contact with people who are sick. . Avoid places or events with large numbers of people in one location, like concerts or  sporting events. . Carefully consider travel plans you have or are making. . If you are planning any travel outside or inside the [place], visit the CDC's Travelers' Health webpage for the latest health notices. . If you have some symptoms but not all symptoms, continue to monitor at home and seek medical attention if your symptoms worsen. . If you are having a medical emergency, call 911.   ADDITIONAL HEALTHCARE OPTIONS FOR PATIENTS  [facility] / e-Visit: [email or web address]         [facility]: [phone]  [facility]: [phone]                   [facility]: [phone]   

## 2019-05-05 NOTE — Telephone Encounter (Signed)
Patient has completed IV therapy including Herceptin and may have port removed at her and surgeon's availability.  This RN will contact Dr Venita Sheffield office at [facility] per above request.

## 2019-05-13 ENCOUNTER — Telehealth (HOSPITAL_COMMUNITY): Payer: Self-pay | Admitting: Vascular Surgery

## 2019-05-13 NOTE — Telephone Encounter (Signed)
Left pt VM, stating 5/27 appt w/ db will be canceled she will be put on wait list, echo 5/27 will be done @ 10am

## 2019-05-25 ENCOUNTER — Ambulatory Visit (HOSPITAL_COMMUNITY)
Admission: RE | Admit: 2019-05-25 | Discharge: 2019-05-25 | Disposition: A | Discharge status: Home / Self Care | Payer: BC Managed Care – PPO | Source: Ambulatory Visit | Attending: Internal Medicine | Admitting: Internal Medicine

## 2019-05-25 ENCOUNTER — Encounter (HOSPITAL_COMMUNITY): Payer: BC Managed Care – PPO | Admitting: Internal Medicine

## 2019-05-25 ENCOUNTER — Other Ambulatory Visit: Payer: Self-pay

## 2019-05-25 DIAGNOSIS — C50512 Malignant neoplasm of lower-outer quadrant of left female breast: Secondary | ICD-10-CM | POA: Diagnosis not present

## 2019-05-25 DIAGNOSIS — Z171 Estrogen receptor negative status [ER-]: Secondary | ICD-10-CM | POA: Insufficient documentation

## 2019-05-30 ENCOUNTER — Other Ambulatory Visit: Payer: Self-pay | Admitting: Oncology

## 2019-06-20 ENCOUNTER — Telehealth: Payer: Self-pay

## 2019-06-20 NOTE — Telephone Encounter (Signed)
Called and left voicemail regarding pre-screening questions for appt on 6/23  

## 2019-06-20 NOTE — Progress Notes (Signed)
[place]  [phone] [phone]    ID: Monica Padilla DOB: [birth year]  MR#: [id number]  WLN#:[id number]  Patient Care Team: Algis Greenhouse, MD as PCP - General (Family Medicine) Signe Colt, MD as Referring Physician (Obstetrics and Gynecology) Pailynn Vahey, Virgie Dad, MD as Consulting Physician (Oncology) Larey Dresser, MD as Consulting Physician (Cardiology) Noberto Retort Juanda Bond., MD as Referring Physician (Surgery) Gatha Mayer, MD as Consulting Physician (Gastroenterology) Nolon Nations, MD as Consulting Physician (Diagnostic Radiology) OTHER MD: Dr. Jimmye Norman and Dr. Michele Mcalpine at [facility]: Estrogen receptor negative, HER-2 positive breast cancer  CURRENT TREATMENT: observation   INTERVAL HISTORY: Monica Padilla returns today for follow-up and treatment of her estrogen receptor negative, HER-2 positive breast cancer.   She completed her trastuzumab treatment in early May. She tolerated this well and without any noticeable side effects.    Monica Padilla's last echocardiogram on 05/25/2019, showed an ejection fraction in the 60% - 65% range.   Since her last visit here, she has not undergone any additional studies.  She is due for mammography, which was last completed in 03/16/2018 at the [facility]; it is scheduled for 07/14/2019.   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 For exercise, she likes to walk. She has some tendonitis in her right shoulder, and she has a list of approved exercises that she can do to help.  The patient denies unusual headaches, visual changes, nausea, vomiting, or dizziness. There has been no unusual cough, phlegm production, or pleurisy. This been no change in bowel or bladder habits. The patient denies unexplained fatigue or unexplained weight loss, bleeding, rash, or fever. A detailed review of systems was otherwise noncontributory.    HISTORY OF  CURRENT ILLNESS: From the original intake note:  The patient noted a palpable mass in her left breast as she was laying down. She brought this to medical attention immediately and had left diagnostic mammography with tomography at breast center  03/11/2018 showing a 2.5 cm palpable mass in the lower outer quadrant of the left breast, with one slightly prominent lymph node that had increased cortical measurement.  Biopsy of this left breast mass 03/17/2018 showed ([id number]), invasive ductal carcinoma, grade 2, estrogen and progesterone receptor negative, with an MIB-1 of 80%, but HER-2 amplified with a signals ratio 5.51 and the number per cell 17.35.  She underwent left mastectomy and sentinel lymph node sampling, as well as right-sided port placement 04/02/2018 under Etheleen Mayhew at [facility] for a 2.6 cm invasive ductal carcinoma, grade 3, with 1 of 3 sentinel nodes showing a micrometastatic deposit, but all margins negative.  She had a port placed at the same time.  The patient's subsequent history is as detailed below.    PAST MEDICAL HISTORY: Past Medical History:  Diagnosis Date  . Breast cancer ([place])    left   . Cancer ([facility])    basal cell CA- chest, leg  . Family history of breast cancer   . Family history of colon cancer   . Family history of lung cancer   . GERD (gastroesophageal reflux disease)   . History of chemotherapy    finished chemo 08-26-2018  . History of radiation therapy    last 11-01-2018  . Hx of colonic polyp 02/08/2019    PAST SURGICAL HISTORY: Past Surgical History:  Procedure Laterality Date  . CESAREAN SECTION  1998   x1  . COLONOSCOPY    .  DILATION AND CURETTAGE OF UTERUS    . fatty tumor removal  06/2016   lower right back   . left mastectomy Left 03/2018  . PARTIAL HYSTERECTOMY    . SKIN CANCER EXCISION    . VAGINAL DELIVERY     x1  . WISDOM TOOTH EXTRACTION      FAMILY HISTORY: Family History  Problem Relation Age of Onset  . Colon  cancer Father 1  . Basal cell carcinoma Sister        2-3  . Lung cancer Maternal Aunt        hx smoking  . Lung cancer Paternal Uncle        hx smoking  . Heart disease Maternal Grandmother   . Heart disease Maternal Grandfather   . Cancer Paternal Grandfather        unk if colon or stomach? died in early 41's.   . Breast cancer Cousin   . Esophageal cancer Neg Hx   . Stomach cancer Neg Hx   . Rectal cancer Neg Hx   . Colon polyps Neg Hx    The patient's father died due to colon cancer at age 79. The patient's mother is alive at age 79. The patient has 1 sister and no brothers. A paternal cousin was diagnosed with breast cancer in the early 37's (before 75). The patient's paternal grandfather passed away from colon cancer. The patient's father had 3 brothers, one of whom had lung cancer.    GYNECOLOGIC HISTORY:  No LMP recorded. Patient has had a hysterectomy. Menarche: 53 years old Age at first live birth: 53 years old She is GXP2.  The patient is status post partial hysterectomy due to uterine fibroids. She retains her cervix and ovaries.  After her hysterectomy she used Estradiol with no complications.  She stopped hormone replacement March 2019.  She is now having hot flashes and nigh sweats that only allow her to sleep 3 hours at a time.    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 Her husband, Monica Padilla, is a Hydrologist for alarms and cameras for [employer]. The patient's oldest son, Monica Padilla, is 55 and works as a Civil engineer, contracting in [place]. The patient's son, Monica Padilla, is 20 an works for a Dispensing optician. [name] lives at home with the patient. Monica Padilla does not have any grandchildren. She attends [employer].    ADVANCED DIRECTIVES:    HEALTH MAINTENANCE: Social History   Tobacco Use  . Smoking status: Never Smoker  . Smokeless tobacco: Never Used  Substance Use Topics  . Alcohol use: Yes    Comment: occasional   . Drug use: No     Colonoscopy: 10/04/2013/ Dr. Carlean Purl / polyp removal  PAP:  Bone density: remote   Allergies  Allergen Reactions  . Codeine Itching  . Nitrofurantoin     REACTION: pruritis  . Amoxicillin Rash  . Pertuzumab Rash    Current Outpatient Medications  Medication Sig Dispense Refill  . Biotin 10000 MCG TABS Take 1 tablet by mouth daily.    . Black Elderberry 50 MG/5ML SYRP 1 tsp daily    . gabapentin (NEURONTIN) 300 MG capsule TAKE 1 CAPSULE BY MOUTH EVERYDAY AT BEDTIME 90 capsule 4  . lidocaine-prilocaine (EMLA) cream Apply to affected area once 30 g 3  . Multiple Vitamins-Minerals (WOMENS MULTI VITAMIN & MINERAL) TABS Take 1 each by mouth.    Marland Kitchen OVER THE COUNTER MEDICATION Whole Mega    .  pantoprazole (PROTONIX) 40 MG tablet Take by mouth. As needed    . Trastuzumab (HERCEPTIN IV) Inject into the vein every 21 ( twenty-one) days.     No current facility-administered medications for this visit.     OBJECTIVE: Young appearing white woman in no acute distress  Vitals:   06/21/19 1051  BP: 128/87  Pulse: 68  Resp: 18  Temp: 98.3 F (36.8 C)  SpO2: 100%     Body mass index is 23.43 kg/m.   Wt Readings from Last 3 Encounters:  06/21/19 124 lb (56.2 kg)  04/14/19 126 lb 6.4 oz (57.3 kg)  03/24/19 127 lb 4 oz (57.7 kg)      ECOG FS:1  Sclerae unicteric, pupils round and equal Wearing a mask No cervical or supraclavicular adenopathy Lungs no rales or rhonchi Heart regular rate and rhythm Abd soft, nontender, positive bowel sounds MSK no focal spinal tenderness, no upper extremity lymphedema Neuro: nonfocal, well oriented, appropriate affect Breasts: The right breast is benign.  The left breast is status post mastectomy and radiation.  There is no evidence of local recurrence.  Both axillae are benign.   LAB RESULTS:  CMP     Component Value Date/Time   NA 142 05/05/2019 0843   K 4.3 05/05/2019 0843   CL 109 05/05/2019 0843   CO2 25 05/05/2019  0843   GLUCOSE 86 05/05/2019 0843   BUN 13 05/05/2019 0843   CREATININE 0.74 05/05/2019 0843   CALCIUM 9.5 05/05/2019 0843   PROT 6.8 05/05/2019 0843   ALBUMIN 4.0 05/05/2019 0843   AST 19 05/05/2019 0843   ALT 19 05/05/2019 0843   ALKPHOS 99 05/05/2019 0843   BILITOT 0.3 05/05/2019 0843   GFRNONAA >60 05/05/2019 0843   GFRAA >60 05/05/2019 0843    No results found for: TOTALPROTELP, ALBUMINELP, A1GS, A2GS, BETS, BETA2SER, GAMS, MSPIKE, SPEI  No results found for: KPAFRELGTCHN, LAMBDASER, KAPLAMBRATIO  Lab Results  Component Value Date   WBC 3.3 (L) 05/05/2019   NEUTROABS 2.0 05/05/2019   HGB 11.8 (L) 05/05/2019   HCT 35.5 (L) 05/05/2019   MCV 91.7 05/05/2019   PLT 150 05/05/2019    _0 @  No results found for: LABCA2  No components found for: [id number]  No results for input(s): INR in the last 168 hours.  No results found for: LABCA2  No results found for: TIR443  No results found for: XVQ008  No results found for: QPY195  No results found for: CA2729  No components found for: HGQUANT  No results found for: CEA1 / No results found for: CEA1   No results found for: AFPTUMOR  No results found for: CHROMOGRNA  No results found for: PSA1  No visits with results within 3 Day(s) from this visit.  Latest known visit with results is:  Appointment on 05/05/2019  Component Date Value Ref Range Status  . Sodium 05/05/2019 142  135 - 145 mmol/L Final  . Potassium 05/05/2019 4.3  3.5 - 5.1 mmol/L Final  . Chloride 05/05/2019 109  98 - 111 mmol/L Final  . CO2 05/05/2019 25  22 - 32 mmol/L Final  . Glucose, Bld 05/05/2019 86  70 - 99 mg/dL Final  . BUN 05/05/2019 13  6 - 20 mg/dL Final  . Creatinine, Ser 05/05/2019 0.74  0.44 - 1.00 mg/dL Final  . Calcium 05/05/2019 9.5  8.9 - 10.3 mg/dL Final  . Total Protein 05/05/2019 6.8  6.5 - 8.1 g/dL Final  . Albumin 05/05/2019 4.0  3.5 - 5.0 g/dL Final  . AST 05/05/2019 19  15 - 41 U/L Final  . ALT  05/05/2019 19  0 - 44 U/L Final  . Alkaline Phosphatase 05/05/2019 99  38 - 126 U/L Final  . Total Bilirubin 05/05/2019 0.3  0.3 - 1.2 mg/dL Final  . GFR calc non Af Amer 05/05/2019 >60  >60 mL/min Final  . GFR calc Af Amer 05/05/2019 >60  >60 mL/min Final  . Anion gap 05/05/2019 8  5 - 15 Final   Performed at [facility], [place] [address] [place], [place] [address]  . WBC 05/05/2019 3.3* 4.0 - 10.5 K/uL Final  . RBC 05/05/2019 3.87  3.87 - 5.11 MIL/uL Final  . Hemoglobin 05/05/2019 11.8* 12.0 - 15.0 g/dL Final  . HCT 05/05/2019 35.5* 36.0 - 46.0 % Final  . MCV 05/05/2019 91.7  80.0 - 100.0 fL Final  . MCH 05/05/2019 30.5  26.0 - 34.0 pg Final  . MCHC 05/05/2019 33.2  30.0 - 36.0 g/dL Final  . RDW 05/05/2019 12.2  11.5 - 15.5 % Final  . Platelets 05/05/2019 150  150 - 400 K/uL Final  . nRBC 05/05/2019 0.0  0.0 - 0.2 % Final  . Neutrophils Relative % 05/05/2019 60  % Final  . Neutro Abs 05/05/2019 2.0  1.7 - 7.7 K/uL Final  . Lymphocytes Relative 05/05/2019 27  % Final  . Lymphs Abs 05/05/2019 0.9  0.7 - 4.0 K/uL Final  . Monocytes Relative 05/05/2019 8  % Final  . Monocytes Absolute 05/05/2019 0.3  0.1 - 1.0 K/uL Final  . Eosinophils Relative 05/05/2019 4  % Final  . Eosinophils Absolute 05/05/2019 0.1  0.0 - 0.5 K/uL Final  . Basophils Relative 05/05/2019 1  % Final  . Basophils Absolute 05/05/2019 0.0  0.0 - 0.1 K/uL Final  . Immature Granulocytes 05/05/2019 0  % Final  . Abs Immature Granulocytes 05/05/2019 0.00  0.00 - 0.07 K/uL Final   Performed at [facility], [place] [address], [place], [place] [address]    (this displays the last labs from the last 3 days)  No results found for: TOTALPROTELP, ALBUMINELP, A1GS, A2GS, BETS, BETA2SER, GAMS, MSPIKE, SPEI (this displays SPEP labs)  No results found for: KPAFRELGTCHN, LAMBDASER, KAPLAMBRATIO (kappa/lambda light chains)  No results found for: HGBA, HGBA2QUANT, HGBFQUANT, HGBSQUAN  (Hemoglobinopathy evaluation)   No results found for: LDH  No results found for: IRON, TIBC, IRONPCTSAT (Iron and TIBC)  No results found for: FERRITIN  Urinalysis No results found for: COLORURINE, APPEARANCEUR, LABSPEC, PHURINE, GLUCOSEU, HGBUR, BILIRUBINUR, KETONESUR, PROTEINUR, UROBILINOGEN, NITRITE, LEUKOCYTESUR   STUDIES: No results found.    ELIGIBLE FOR AVAILABLE RESEARCH PROTOCOL: not discussed   ASSESSMENT: 53 y.o. [place], [place] woman status post left mastectomy and sentinel lymph node sampling 04/02/2018 for a pT2 pN1, stage IIB invasive ductal carcinoma, grade 3, estrogen and progesterone receptor negative, but HER-2 amplified  (a) not planning on reconstruction  (1) genetics testing 07/14/2018 through [facility] found no deleterious mutations in APC, ATM, AXIN2, BAP1, BARD1, BLM, BMPR1A, BRCA1, BRCA2, BRIP1, BUB1B, CDC73, CDH1, CDK4, CDKN1C, CDKN2A (p14ARF), CDKN2A (p16INK4a), CEP57, CHEK2, CTNNA1, DICER1, DIS3L2, ENG, EPCAM*, FH, FLCN, GALNT12, GPC3, GREM1*, KIT, MEN1, MET, MLH1, MLH3, MSH2, MSH3, MSH6, MUTYH, NBN, NF1, PALB2, PDGFRA, PMS2, POLD1, POLE, PTEN, RAD50, RAD51C, RAD51D, RPS20, SDHB, SDHC, SDHD, SMAD4, SMARCA4, SMARCB1, STK11, TP53, TSC1, TSC2, VHL, WT1. The following genes were evaluated for sequence changes only: EGFR*, HOXB13*, NTHL1*, SDHA  (  a) A variant of uncertain significance in [facility] was identified [id number] ([id number]  (2) adjuvant chemotherapy consisting of carboplatin and docetaxel given every 21 days x 6, started 05/13/2018, completed 08/26/2018  (3) trastuzumab and Pertuzumab started 05/13/2018; trastuzumab to be continued to complete a year--last dose 05/05/2019  (a) pertuzumab discontinued after cycle 2 because of allergic reactions  (b) echocardiogram 05/06/2018 shows an ejection fraction in the 65-70%  (c) echocardiogram 08/12/2018 shows an ejection fraction in the 65-70%  (d) echocardiogram 11/10/2018 shows an ejection fraction in the  60-65% range  (e) echo 02/14/2019 shows an ejection fraction in the 60-65% range  (f) echo 05/25/2019 shows EF of 60-65%  (4) adjuvant radiation 09/06/18-11/01/18 Site/dose:  1. Left Axilla, 1.8 Gy in 25 fractions for a total of 45 Gy                     2. Left CW, 1.8 Gy in 28 fractions for a total of 50.4 Gy                     3. Boost, 2 Gy in 5 fractions for a total of 10 Gy  PLAN: Jason is now a little over a year out from definitive surgery for her breast cancer with no evidence of disease recurrence.  This is very favorable.  She has completed all her systemic treatment and we are starting observation.  She will have her mammogram in July and see her surgeon shortly after that.  In October she will see my nurse practitioner and have a survivorship meeting with her.  She will then return to see me in April and from that point we can consider broadening the follow-up interval.  She does have decreased range of motion in the right upper extremity.  I think she would benefit from physical therapy but it would be difficult for her to receive that here in [place].  She will discuss that with her primary care physician in [place]  She has a very good prognosis overall and I reassured her regarding that.  She would enjoy participating in the finding your new normal group here and we will let her know whenever that class resumes  She knows to call for any other issue that may develop before the next visit.  Nollie Shiflett, Virgie Dad, MD  06/21/19 11:18 AM Medical Oncology and Hematology [facility] [address] [place], [place] [address] Tel. [phone]    Fax. [phone]   I, Jacqualyn Posey am acting as a Education administrator for Chauncey Cruel, MD.   I, Lurline Del MD, have reviewed the above documentation for accuracy and completeness, and I agree with the above.

## 2019-06-21 ENCOUNTER — Other Ambulatory Visit: Payer: Self-pay

## 2019-06-21 ENCOUNTER — Inpatient Hospital Stay: Payer: BC Managed Care – PPO | Attending: Oncology | Admitting: Oncology

## 2019-06-21 VITALS — BP 128/87 | HR 68 | Temp 98.3°F | Resp 18 | Ht 61.0 in | Wt 124.0 lb

## 2019-06-21 DIAGNOSIS — Z79899 Other long term (current) drug therapy: Secondary | ICD-10-CM | POA: Diagnosis not present

## 2019-06-21 DIAGNOSIS — Z171 Estrogen receptor negative status [ER-]: Secondary | ICD-10-CM

## 2019-06-21 DIAGNOSIS — Z9012 Acquired absence of left breast and nipple: Secondary | ICD-10-CM | POA: Diagnosis not present

## 2019-06-21 DIAGNOSIS — C50512 Malignant neoplasm of lower-outer quadrant of left female breast: Secondary | ICD-10-CM | POA: Insufficient documentation

## 2019-06-22 ENCOUNTER — Telehealth: Payer: Self-pay | Admitting: Oncology

## 2019-06-22 NOTE — Telephone Encounter (Signed)
I left a message regarding schedule  

## 2019-09-01 ENCOUNTER — Other Ambulatory Visit: Payer: Self-pay

## 2019-09-21 ENCOUNTER — Telehealth: Payer: Self-pay | Admitting: Adult Health

## 2019-09-21 NOTE — Telephone Encounter (Signed)
I left a message regarding move from 10/12 to 10/16 as video visit

## 2019-10-10 ENCOUNTER — Encounter: Payer: BC Managed Care – PPO | Admitting: Adult Health

## 2019-10-10 ENCOUNTER — Other Ambulatory Visit: Payer: BC Managed Care – PPO

## 2019-10-13 ENCOUNTER — Telehealth: Payer: Self-pay | Admitting: Adult Health

## 2019-10-13 NOTE — Telephone Encounter (Signed)
Left message for patient to verify mychart visit for pre reg °

## 2019-10-14 ENCOUNTER — Encounter: Payer: Self-pay | Admitting: Adult Health

## 2019-10-14 ENCOUNTER — Inpatient Hospital Stay: Payer: BC Managed Care – PPO | Attending: Adult Health | Admitting: Adult Health

## 2019-10-14 DIAGNOSIS — Z171 Estrogen receptor negative status [ER-]: Secondary | ICD-10-CM

## 2019-10-14 DIAGNOSIS — C50512 Malignant neoplasm of lower-outer quadrant of left female breast: Secondary | ICD-10-CM | POA: Diagnosis not present

## 2019-10-14 NOTE — Progress Notes (Signed)
SURVIVORSHIP VIRTUAL VISIT:  I connected with Monica Padilla on 10/14/19 at  3:30 PM EDT by my chart video and verified that I am speaking with the correct person using two identifiers.  I discussed the limitations, risks, security and privacy concerns of performing an evaluation and management service by telephone and the availability of in person appointments. I also discussed with the patient that there may be a patient responsible charge related to this service. The patient expressed understanding and agreed to proceed.   BRIEF ONCOLOGIC HISTORY:  Oncology History  Malignant neoplasm of lower-outer quadrant of left breast of female, estrogen receptor negative ([place])  04/02/2018 Initial Diagnosis   53 y.o. [place], [place] woman status post left mastectomy and sentinel lymph node sampling 04/02/2018 for a pT2 pN1, stage IIB invasive ductal carcinoma, grade 3, estrogen and progesterone receptor negative, but HER-2 amplified             (a) not planning on reconstruction   04/02/2018 Cancer Staging   Staging form: Breast, AJCC 8th Edition - Pathologic stage from 04/02/2018: Stage IIB (pT2, pN1, cM0, G3, ER-, PR-, HER2+) - Signed by Gardenia Phlegm, NP on 09/20/2019   05/13/2018 -  Chemotherapy   Docetaxel and Carboplatin given every 3 weeks x 6 cycles, with gemcitabine substituted for docetaxel with cycle 6 due to neuropathy.  Trastuzumab and Pertuzumab given for 1 year 04/2018 through 04/2019   07/14/2018 Genetic Testing   A 65 gene panel was ordered from the laboratory [facility]. The following genes were evaluated for sequence changes and exonic deletions/duplications: APC, ATM, AXIN2, BAP1, BARD1, BLM, BMPR1A, BRCA1, BRCA2, BRIP1, BUB1B, CDC73, CDH1, CDK4, CDKN1C, CDKN2A (p14ARF), CDKN2A (p16INK4a), CEP57, CHEK2, CTNNA1, DICER1, DIS3L2, ENG, EPCAM*, FH, FLCN, GALNT12, GPC3, GREM1*, KIT, MEN1, MET, MLH1, MLH3, MSH2, MSH3, MSH6, MUTYH, NBN, NF1, PALB2, PDGFRA, PMS2, POLD1, POLE, PTEN, RAD50, RAD51C,  RAD51D, RPS20, SDHB, SDHC, SDHD, SMAD4, SMARCA4, SMARCB1, STK11, TP53, TSC1, TSC2, VHL, WT1. The following genes were evaluated for sequence changes only: EGFR*, HOXB13*, NTHL1*, SDHA  Results: No pathogenic variants identified.  A variant of uncertain significance in the gene SMARCA4 was identified c.[id number] (p.[id number]).  The date of this teste report is 07/14/2018.    09/06/2018 - 11/01/2018 Radiation Therapy   Site/dose:  1. Left Axilla, 1.8 Gy in 25 fractions for a total of 45 Gy                     2. Left CW, 1.8 Gy in 28 fractions for a total of 50.4 Gy                     3. Boost, 2 Gy in 5 fractions for a total of 10 Gy     INTERVAL HISTORY:  Monica Padilla to review her survivorship care plan detailing her treatment course for breast cancer, as well as monitoring long-term side effects of that treatment, education regarding health maintenance, screening, and overall wellness and health promotion.     Overall, Monica Padilla reports feeling quite well.  She notes her skin since completing her radiation is improved.  She had some left chest wall tenderness, and this is much improved.  She notes she is regaining her activity level and is working with her mom with getting her into assisted living and selling her moms townhouse.  She is also working part time.    Monica Padilla underwent mammogram recently that was normal, and also was found to have osteoporosis and is taking  oral fosamax.  She is tolerating this well.    REVIEW OF SYSTEMS:  Review of Systems  Constitutional: Negative for appetite change, chills, fatigue, fever and unexpected weight change.  HENT:   Negative for hearing loss, lump/mass, sore throat and trouble swallowing.   Eyes: Negative for eye problems and icterus.  Respiratory: Negative for chest tightness, cough and shortness of breath.   Cardiovascular: Negative for chest pain, leg swelling and palpitations.  Gastrointestinal: Negative for abdominal distention, abdominal pain,  constipation, diarrhea, nausea and vomiting.  Endocrine: Negative for hot flashes.  Genitourinary: Negative for difficulty urinating.   Musculoskeletal: Negative for arthralgias.  Skin: Negative for itching and rash.  Neurological: Negative for dizziness, extremity weakness, headaches and numbness.  Hematological: Negative for adenopathy. Does not bruise/bleed easily.  Psychiatric/Behavioral: Negative for depression. The patient is not nervous/anxious.   Breast: Denies any new nodularity, masses, tenderness, nipple changes, or nipple discharge.      ONCOLOGY TREATMENT TEAM:  1. Surgeon:  Dr. Noberto Retort at [facility] 2. Medical Oncologist: Dr. Jana Hakim  3. Radiation Oncologist: Dr. Sondra Come    PAST MEDICAL/SURGICAL HISTORY:  Past Medical History:  Diagnosis Date  . Breast cancer ([place])    left   . Cancer ([facility])    basal cell CA- chest, leg  . Family history of breast cancer   . Family history of colon cancer   . Family history of lung cancer   . GERD (gastroesophageal reflux disease)   . History of chemotherapy    finished chemo 08-26-2018  . History of radiation therapy    last 11-01-2018  . Hx of colonic polyp 02/08/2019   Past Surgical History:  Procedure Laterality Date  . CESAREAN SECTION  1998   x1  . COLONOSCOPY    . DILATION AND CURETTAGE OF UTERUS    . fatty tumor removal  06/2016   lower right back   . left mastectomy Left 03/2018  . PARTIAL HYSTERECTOMY    . SKIN CANCER EXCISION    . VAGINAL DELIVERY     x1  . WISDOM TOOTH EXTRACTION       ALLERGIES:  Allergies  Allergen Reactions  . Codeine Itching  . Nitrofurantoin     REACTION: pruritis  . Amoxicillin Rash  . Pertuzumab Rash     CURRENT MEDICATIONS:  Outpatient Encounter Medications as of 10/14/2019  Medication Sig  . Biotin 10000 MCG TABS Take 1 tablet by mouth daily.  . Black Elderberry 50 MG/5ML SYRP 1 tsp daily  . gabapentin (NEURONTIN) 300 MG capsule TAKE 1 CAPSULE BY MOUTH  EVERYDAY AT BEDTIME  . lidocaine-prilocaine (EMLA) cream Apply to affected area once  . Multiple Vitamins-Minerals (WOMENS MULTI VITAMIN & MINERAL) TABS Take 1 each by mouth.  Marland Kitchen OVER THE COUNTER MEDICATION Whole Mega  . pantoprazole (PROTONIX) 40 MG tablet Take by mouth. As needed  . Trastuzumab (HERCEPTIN IV) Inject into the vein every 21 ( twenty-one) days.   No facility-administered encounter medications on file as of 10/14/2019.      ONCOLOGIC FAMILY HISTORY:  Family History  Problem Relation Age of Onset  . Colon cancer Father 98  . Basal cell carcinoma Sister        2-3  . Lung cancer Maternal Aunt        hx smoking  . Lung cancer Paternal Uncle        hx smoking  . Heart disease Maternal Grandmother   . Heart disease Maternal Grandfather   .  Cancer Paternal Grandfather        unk if colon or stomach? died in early 78's.   . Breast cancer Cousin   . Esophageal cancer Neg Hx   . Stomach cancer Neg Hx   . Rectal cancer Neg Hx   . Colon polyps Neg Hx      GENETIC COUNSELING/TESTING: negative  SOCIAL HISTORY:  Social History   Socioeconomic History  . Marital status: Married    Spouse name: Not on file  . Number of children: Not on file  . Years of education: Not on file  . Highest education level: Not on file  Occupational History  . Not on file  Social Needs  . Financial resource strain: Not on file  . Food insecurity    Worry: Not on file    Inability: Not on file  . Transportation needs    Medical: Not on file    Non-medical: Not on file  Tobacco Use  . Smoking status: Never Smoker  . Smokeless tobacco: Never Used  Substance and Sexual Activity  . Alcohol use: Yes    Comment: occasional  . Drug use: No  . Sexual activity: Not on file  Lifestyle  . Physical activity    Days per week: Not on file    Minutes per session: Not on file  . Stress: Not on file  Relationships  . Social Herbalist on phone: Not on file    Gets together: Not  on file    Attends religious service: Not on file    Active member of club or organization: Not on file    Attends meetings of clubs or organizations: Not on file    Relationship status: Not on file  . Intimate partner violence    Fear of current or ex partner: Not on file    Emotionally abused: Not on file    Physically abused: Not on file    Forced sexual activity: Not on file  Other Topics Concern  . Not on file  Social History Narrative  . Not on file     OBSERVATIONS/OBJECTIVE:  Patient appears well.  She is in no apparent distress. Skin visualized without rash or lesion.  Breathing is non labored.  Mood and behavior are normal.    LABORATORY DATA:  None for this visit.  DIAGNOSTIC IMAGING:  None for this visit.      ASSESSMENT AND PLAN:  Monica Padilla.. [place] is a pleasant 54 y.o. female with Stage IIB left breast invasive ductal carcinoma, ER-/PR-/HER2+, diagnosed in 02/2018, treated with mastectomy, adjuvant chemotherapy, maintenance Trastuzumab and adjuvant radiation therapy.  She presents to the Survivorship Clinic for our initial meeting and routine follow-up post-completion of treatment for breast cancer.    1. Stage left breast cancer:  Monica Padilla. [place] is continuing to recover from definitive treatment for breastcancer. She will follow-up with her medical oncologist, Dr. Jana Hakim in 03/2019 with history and physical exam per surveillance protocol.Today, a comprehensive survivorship care plan and treatment summary was reviewed with the patient today detailing her breast cancer diagnosis, treatment course, potential late/long-term effects of treatment, appropriate follow-up care with recommendations for the future, and patient education resources.  A copy of this summary, along with a letter will be sent to the patient's primary care provider via mail/fax/In Basket message after today's visit.    2. Bone health:  Given Monica Padilla. Sandles's age/history of breast cancer, she is at risk for bone  demineralization.  She was recently  diagnosed with osteoporosis and is tolerating her treatment well.  She was given education on specific activities to promote bone health.  3. Cancer screening:  Due to Monica Padilla. [place]'s history and her age, she should receive screening for skin cancers, colon cancer, and gynecologic cancers.  The information and recommendations are listed on the patient's comprehensive care plan/treatment summary and were reviewed in detail with the patient.    4. Health maintenance and wellness promotion: Monica Padilla. [place] was encouraged to consume 5-7 servings of fruits and vegetables per day. We reviewed the "Nutrition Rainbow" handout, as well as the handout "Take Control of Your Health and Reduce Your Cancer Risk" from the [employer].  She was also encouraged to engage in moderate to vigorous exercise for 30 minutes per day most days of the week. We discussed the [employer] fitness program, which is designed for cancer survivors to help them become more physically fit after cancer treatments.  She was instructed to limit her alcohol consumption and continue to abstain from tobacco use.     5. Support services/counseling: It is not uncommon for this period of the patient's cancer care trajectory to be one of many emotions and stressors.  We discussed how this can be increasingly difficult during the times of quarantine and social distancing due to the COVID-19 pandemic.   She was given information regarding our available services and encouraged to contact me with any questions or for help enrolling in any of our support group/programs.    Follow up instructions:    -Return to cancer center in April 2020  -Mammogram due 06/2020 -She is welcome to return back to the Survivorship Clinic at any time; no additional follow-up needed at this time.  -Consider referral back to survivorship as a long-term survivor for continued surveillance  The patient was provided an opportunity to  ask questions and all were answered. The patient agreed with the plan and demonstrated an understanding of the instructions.   The patient was advised to call back or seek an in-person evaluation if the symptoms worsen or if the condition fails to improve as anticipated.   I provided 25 minutes of face-to-face video visit time during this encounter, and > 50% was spent counseling as documented under my assessment & plan.  Scot Dock, NP

## 2019-10-19 ENCOUNTER — Telehealth: Payer: Self-pay | Admitting: Adult Health

## 2019-10-19 NOTE — Telephone Encounter (Signed)
I left a message regarding schedule  

## 2019-10-26 ENCOUNTER — Telehealth: Payer: Self-pay | Admitting: Oncology

## 2019-10-26 NOTE — Telephone Encounter (Signed)
Patient called to reschedule  °

## 2019-11-04 ENCOUNTER — Other Ambulatory Visit: Payer: BC Managed Care – PPO

## 2019-11-07 ENCOUNTER — Other Ambulatory Visit: Payer: Self-pay

## 2019-11-07 ENCOUNTER — Inpatient Hospital Stay: Payer: BC Managed Care – PPO | Attending: Adult Health

## 2019-11-07 DIAGNOSIS — Z171 Estrogen receptor negative status [ER-]: Secondary | ICD-10-CM

## 2019-11-07 DIAGNOSIS — C50512 Malignant neoplasm of lower-outer quadrant of left female breast: Secondary | ICD-10-CM | POA: Insufficient documentation

## 2019-11-07 LAB — CMP (CANCER CENTER ONLY)
ALT: 32 U/L (ref 0–44)
AST: 24 U/L (ref 15–41)
Albumin: 4.2 g/dL (ref 3.5–5.0)
Alkaline Phosphatase: 108 U/L (ref 38–126)
Anion gap: 10 (ref 5–15)
BUN: 12 mg/dL (ref 6–20)
CO2: 29 mmol/L (ref 22–32)
Calcium: 9.3 mg/dL (ref 8.9–10.3)
Chloride: 103 mmol/L (ref 98–111)
Creatinine: 0.79 mg/dL (ref 0.44–1.00)
GFR, Est AFR Am: >60 mL/min (ref >60)
GFR, Estimated: >60 mL/min (ref >60)
Glucose, Bld: 98 mg/dL (ref 70–99)
Potassium: 4 mmol/L (ref 3.5–5.1)
Sodium: 142 mmol/L (ref 135–145)
Total Bilirubin: 0.3 mg/dL (ref 0.3–1.2)
Total Protein: 7.1 g/dL (ref 6.5–8.1)

## 2019-11-07 LAB — CBC WITH DIFFERENTIAL (CANCER CENTER ONLY)
Abs Immature Granulocytes: 0.01 10*3/uL (ref 0.00–0.07)
Basophils Absolute: 0 10*3/uL (ref 0.0–0.1)
Basophils Relative: 0 %
Eosinophils Absolute: 0.1 10*3/uL (ref 0.0–0.5)
Eosinophils Relative: 2 %
HCT: 38.5 % (ref 36.0–46.0)
Hemoglobin: 12.9 g/dL (ref 12.0–15.0)
Immature Granulocytes: 0 %
Lymphocytes Relative: 28 %
Lymphs Abs: 1.4 10*3/uL (ref 0.7–4.0)
MCH: 31.2 pg (ref 26.0–34.0)
MCHC: 33.5 g/dL (ref 30.0–36.0)
MCV: 93 fL (ref 80.0–100.0)
Monocytes Absolute: 0.5 10*3/uL (ref 0.1–1.0)
Monocytes Relative: 9 %
Neutro Abs: 3 10*3/uL (ref 1.7–7.7)
Neutrophils Relative %: 61 %
Platelet Count: 157 10*3/uL (ref 150–400)
RBC: 4.14 MIL/uL (ref 3.87–5.11)
RDW: 12.3 % (ref 11.5–15.5)
WBC Count: 5 10*3/uL (ref 4.0–10.5)
nRBC: 0 % (ref 0.0–0.2)

## 2020-04-08 NOTE — Progress Notes (Signed)
[place]  [phone] [phone]    ID: Monica Padilla DOB: Sep 21, 1966  MR#: [id number]  JYN#:[id number]  Patient Care Team: Algis Greenhouse, MD as PCP - General (Family Medicine) Dezarai Prew, Virgie Dad, MD as Consulting Physician (Oncology) Noberto Retort Juanda Bond., MD as Referring Physician (Surgery) Gatha Mayer, MD as Consulting Physician (Gastroenterology) Nolon Nations, MD as Consulting Physician (Diagnostic Radiology) Gery Pray, MD as Consulting Physician (Radiation Oncology) Marylynn Pearson, MD as Consulting Physician (Obstetrics and Gynecology) OTHER MD: Dr. Jimmye Norman and Dr. 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 She continues on observation.  She underwent right mammogram in 06/2019 at Dr. Orvan Seen.  We do not have a copy of that study on the 2019 mammography breast density was category C.  She is due for repeat July of this year  She also underwent bone density screening, which showed osteoporosis. She was started on Fosamax, which she is taking appropriately and without any side effects.   REVIEW OF SYSTEMS: Scott walks between 2 and 4 miles most days.  She does a little bit of running to get her heart rate up and then walks.  She is now working part-time and has a little bit more time to herself.  She has not yet had her Covid vaccines but is planning to get them within the next month.  She has good range of motion in the left upper extremity.  She has no chest wall issues.  A detailed review of systems today was otherwise stable.   HISTORY OF CURRENT ILLNESS: From the original intake note:  The patient noted a palpable mass in her left breast as she was laying down. She brought this to  medical attention immediately and had left diagnostic mammography with tomography at breast center  03/11/2018 showing a 2.5 cm palpable mass in the lower outer quadrant of the left breast, with one slightly prominent lymph node that had increased cortical measurement.  Biopsy of this left breast mass 03/17/2018 showed ([id number]), invasive ductal carcinoma, grade 2, estrogen and progesterone receptor negative, with an MIB-1 of 80%, but HER-2 amplified with a signals ratio 5.51 and the number per cell 17.35.  She underwent left mastectomy and sentinel lymph node sampling, as well as right-sided port placement 04/02/2018 under Etheleen Mayhew at [facility] for a 2.6 cm invasive ductal carcinoma, grade 3, with 1 of 3 sentinel nodes showing a micrometastatic deposit, but all margins negative.  She had a port placed at the same time.  The patient's subsequent history is as detailed below.   PAST MEDICAL HISTORY: Past Medical History:  Diagnosis Date  . Breast cancer ([place])    left   . Cancer ([facility])    basal cell CA- chest, leg  . Family history of breast cancer   . Family history of colon cancer   . Family history of lung cancer   . GERD (gastroesophageal reflux disease)   . History of chemotherapy    finished chemo 08-26-2018  . History of radiation therapy    last 11-01-2018  . Hx of colonic polyp 02/08/2019    PAST SURGICAL HISTORY: Past Surgical History:  Procedure Laterality Date  . CESAREAN SECTION  1998   x1  . COLONOSCOPY    . DILATION AND CURETTAGE OF UTERUS    .  fatty tumor removal  06/2016   lower right back   . left mastectomy Left 03/2018  . PARTIAL HYSTERECTOMY    . SKIN CANCER EXCISION    . VAGINAL DELIVERY     x1  . WISDOM TOOTH EXTRACTION      FAMILY HISTORY: Family History  Problem Relation Age of Onset  . Colon cancer Father 70  . Basal cell carcinoma Sister        2-3  . Lung cancer Maternal Aunt        hx smoking  . Lung cancer Paternal Uncle         hx smoking  . Heart disease Maternal Grandmother   . Heart disease Maternal Grandfather   . Cancer Paternal Grandfather        unk if colon or stomach? died in early 50's.   . Breast cancer Cousin   . Esophageal cancer Neg Hx   . Stomach cancer Neg Hx   . Rectal cancer Neg Hx   . Colon polyps Neg Hx    The patient's father died due to colon cancer at age 71. The patient's mother is alive at age 82. The patient has 1 sister and no brothers. A paternal cousin was diagnosed with breast cancer in the early 40's (before 45). The patient's paternal grandfather passed away from colon cancer. The patient's father had 3 brothers, one of whom had lung cancer.    GYNECOLOGIC HISTORY:  No LMP recorded. Patient has had a hysterectomy. Menarche: 54 years old Age at first live birth: 54 years old She is GXP2.  The patient is status post partial hysterectomy due to uterine fibroids. She retains her cervix and ovaries.  After her hysterectomy she used Estradiol with no complications.  She stopped hormone replacement March 2019.  She is now having hot flashes and nigh sweats that only allow her to sleep 3 hours at a time.    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 Her husband, Steve, is a service tech for alarms and cameras for [employer]. The patient's oldest son, Monica Padilla,  works as a civil engineer in [place]; his wife is also an engineer.. The patient's younger son, Monica Padilla, graduated from [employer] in agriculture and manages a poultry company.  His wife works for [employer].  Monica Padilla does not yet have any grandchildren. She attends [employer].    ADVANCED DIRECTIVES: In the absence of any documentation to the contrary, the patient's spouse is their HCPOA.    HEALTH MAINTENANCE: Social History   Tobacco Use  . Smoking status: Never Smoker  .  Smokeless tobacco: Never Used  Substance Use Topics  . Alcohol use: Yes    Comment: occasional  . Drug use: No     Colonoscopy: 01/2019 (Dr. Gessner), recall 2025  PAP:  Bone density: remote   Allergies  Allergen Reactions  . Codeine Itching  . Nitrofurantoin     REACTION: pruritis  . Amoxicillin Rash  . Pertuzumab Rash    Current Outpatient Medications  Medication Sig Dispense Refill  . alendronate (FOSAMAX) 70 MG tablet Take 1 tablet (70 mg total) by mouth once a week. Take with a full glass of water on an empty stomach.    . cholecalciferol (VITAMIN D3) 25 MCG (1000 UNIT) tablet Take 5 tablets (5,000 Units total) by mouth daily.    .   gabapentin (NEURONTIN) 300 MG capsule Take 1 capsule (300 mg total) by mouth at bedtime. 90 capsule 4  . Multiple Vitamins-Minerals (WOMENS MULTI VITAMIN & MINERAL) TABS Take 1 each by mouth.    . pantoprazole (PROTONIX) 40 MG tablet Take by mouth. As needed     No current facility-administered medications for this visit.    OBJECTIVE:  white woman who appears younger than stated age  17:   04/09/20 1008  BP: 123/78  Pulse: 62  Resp: 18  Temp: 98 F (36.7 C)  SpO2: 93%     Body mass index is 23 kg/m.   Wt Readings from Last 3 Encounters:  04/09/20 121 lb 11.2 oz (55.2 kg)  06/21/19 124 lb (56.2 kg)  04/14/19 126 lb 6.4 oz (57.3 kg)      ECOG FS:1  Sclerae unicteric, EOMs intact Wearing a mask No cervical or supraclavicular adenopathy Lungs no rales or rhonchi Heart regular rate and rhythm Abd soft, nontender, positive bowel sounds MSK no focal spinal tenderness, no upper extremity lymphedema Neuro: nonfocal, well oriented, appropriate affect Breasts: The right breast shows no masses or skin changes of concern for the left breast is status post mastectomy.  The chest wall shows no evidence of local recurrence.  Both axillae are benign.    LAB RESULTS:  CMP     Component Value Date/Time   NA 143 04/09/2020 0933   K  4.1 04/09/2020 0933   CL 107 04/09/2020 0933   CO2 28 04/09/2020 0933   GLUCOSE 90 04/09/2020 0933   BUN 8 04/09/2020 0933   CREATININE 0.73 04/09/2020 0933   CREATININE 0.79 11/07/2019 1612   CALCIUM 9.4 04/09/2020 0933   PROT 6.9 04/09/2020 0933   ALBUMIN 4.1 04/09/2020 0933   AST 22 04/09/2020 0933   AST 24 11/07/2019 1612   ALT 17 04/09/2020 0933   ALT 32 11/07/2019 1612   ALKPHOS 72 04/09/2020 0933   BILITOT 0.5 04/09/2020 0933   BILITOT 0.3 11/07/2019 1612   GFRNONAA >60 04/09/2020 0933   GFRNONAA >60 11/07/2019 1612   GFRAA >60 04/09/2020 0933   GFRAA >60 11/07/2019 1612    No results found for: TOTALPROTELP, ALBUMINELP, A1GS, A2GS, BETS, BETA2SER, GAMS, MSPIKE, SPEI  No results found for: KPAFRELGTCHN, LAMBDASER, KAPLAMBRATIO  Lab Results  Component Value Date   WBC 3.6 (L) 04/09/2020   NEUTROABS 1.9 04/09/2020   HGB 12.9 04/09/2020   HCT 39.7 04/09/2020   MCV 94.1 04/09/2020   PLT 138 (L) 04/09/2020   No results found for: LABCA2  No components found for: [id number]  No results for input(s): INR in the last 168 hours.  No results found for: LABCA2  No results found for: NWG956  No results found for: OZH086  No results found for: VHQ469  No results found for: CA2729  No components found for: HGQUANT  No results found for: CEA1 / No results found for: CEA1   No results found for: AFPTUMOR  No results found for: CHROMOGRNA  No results found for: HGBA, HGBA2QUANT, HGBFQUANT, HGBSQUAN (Hemoglobinopathy evaluation)   No results found for: LDH  No results found for: IRON, TIBC, IRONPCTSAT (Iron and TIBC)  No results found for: FERRITIN  Urinalysis No results found for: COLORURINE, APPEARANCEUR, LABSPEC, PHURINE, GLUCOSEU, HGBUR, BILIRUBINUR, KETONESUR, PROTEINUR, UROBILINOGEN, NITRITE, LEUKOCYTESUR   STUDIES: No results found.    ELIGIBLE FOR AVAILABLE RESEARCH PROTOCOL: not discussed   ASSESSMENT: 54 y.o. [place], Alpha woman status  post left mastectomy and sentinel  lymph node sampling 04/02/2018 for a pT2 pN1, stage IIB invasive ductal carcinoma, grade 3, estrogen and progesterone receptor negative, but HER-2 amplified  (a) not planning on reconstruction  (1) genetics testing 07/14/2018 through [facility] found no deleterious mutations in APC, ATM, AXIN2, BAP1, BARD1, BLM, BMPR1A, BRCA1, BRCA2, BRIP1, BUB1B, CDC73, CDH1, CDK4, CDKN1C, CDKN2A (p14ARF), CDKN2A (p16INK4a), CEP57, CHEK2, CTNNA1, DICER1, DIS3L2, ENG, EPCAM*, FH, FLCN, GALNT12, GPC3, GREM1*, KIT, MEN1, MET, MLH1, MLH3, MSH2, MSH3, MSH6, MUTYH, NBN, NF1, PALB2, PDGFRA, PMS2, POLD1, POLE, PTEN, RAD50, RAD51C, RAD51D, RPS20, SDHB, SDHC, SDHD, SMAD4, SMARCA4, SMARCB1, STK11, TP53, TSC1, TSC2, VHL, WT1. The following genes were evaluated for sequence changes only: EGFR*, HOXB13*, NTHL1*, SDHA  (a) A variant of uncertain significance in SMARCA4 was identified c.[id number]>G (p.[id number]  (2) adjuvant chemotherapy consisting of carboplatin and docetaxel given every 21 days x 6, started 05/13/2018, completed 08/26/2018  (3) trastuzumab and Pertuzumab started 05/13/2018; trastuzumab continued to complete a year--last dose 05/05/2019  (a) pertuzumab discontinued after cycle 2 because of allergic reactions  (b) echocardiogram 05/06/2018 shows an ejection fraction in the 65-70%  (c) echocardiogram 08/12/2018 shows an ejection fraction in the 65-70%  (d) echocardiogram 11/10/2018 shows an ejection fraction in the 60-65% range  (e) echo 02/14/2019 shows an ejection fraction in the 60-65% range  (f) echo 05/25/2019 shows EF of 60-65%  (4) adjuvant radiation 09/06/18-11/01/18 Site/dose:  1. Left Axilla, 1.8 Gy in 25 fractions for a total of 45 Gy                     2. Left CW, 1.8 Gy in 28 fractions for a total of 50.4 Gy                     3. Boost, 2 Gy in 5 fractions for a total of 10 Gy   PLAN: Odelia is now 2 years out from definitive surgery for her breast cancer with no  evidence of disease recurrence.  This is very favorable.  She has her mammogram and bone densities now through Dr. Atkinson's office and see [facility] in July or August.  She also sees her surgeon in July.  Accordingly I am going to start seeing her in February and we will do yearly visits until she completes her 5-year observation.  I commended her excellent exercise program.  I anticipate when she has her next bone density it will show improvement.  I gave her a new prescription for bras and prostheses.  She knows to call for any other issue that may develop before the next visit here.  Total encounter time 25 minutes.*  Magrinat, Gustav C, MD  04/09/20 10:11 AM Medical Oncology and Hematology [facility] [address] [place], [place] [address] Tel. [phone]    Fax. [phone]    I, Katie Daubenspeck, am acting as scribe for Dr. Gustav C. Magrinat.  I, Gustav Magrinat MD, have reviewed the above documentation for accuracy and completeness, and I agree with the above.    *Total Encounter Time as defined by the Centers for Medicare and Medicaid Services includes, in addition to the face-to-face time of a patient visit (documented in the note above) non-face-to-face time: obtaining and reviewing outside history, ordering and reviewing medications, tests or procedures, care coordination (communications with other health care professionals or caregivers) and documentation in the medical record.  

## 2020-04-09 ENCOUNTER — Telehealth: Payer: Self-pay | Admitting: Oncology

## 2020-04-09 ENCOUNTER — Inpatient Hospital Stay: Payer: BC Managed Care – PPO | Attending: Oncology

## 2020-04-09 ENCOUNTER — Inpatient Hospital Stay (HOSPITAL_BASED_OUTPATIENT_CLINIC_OR_DEPARTMENT_OTHER): Payer: BC Managed Care – PPO | Admitting: Oncology

## 2020-04-09 ENCOUNTER — Other Ambulatory Visit: Payer: Self-pay

## 2020-04-09 VITALS — BP 123/78 | HR 62 | Temp 98.0°F | Resp 18 | Ht 61.0 in | Wt 121.7 lb

## 2020-04-09 DIAGNOSIS — Z9012 Acquired absence of left breast and nipple: Secondary | ICD-10-CM | POA: Insufficient documentation

## 2020-04-09 DIAGNOSIS — C50512 Malignant neoplasm of lower-outer quadrant of left female breast: Secondary | ICD-10-CM

## 2020-04-09 DIAGNOSIS — Z9221 Personal history of antineoplastic chemotherapy: Secondary | ICD-10-CM | POA: Diagnosis not present

## 2020-04-09 DIAGNOSIS — Z90710 Acquired absence of both cervix and uterus: Secondary | ICD-10-CM | POA: Insufficient documentation

## 2020-04-09 DIAGNOSIS — Z803 Family history of malignant neoplasm of breast: Secondary | ICD-10-CM | POA: Insufficient documentation

## 2020-04-09 DIAGNOSIS — N951 Menopausal and female climacteric states: Secondary | ICD-10-CM | POA: Insufficient documentation

## 2020-04-09 DIAGNOSIS — Z171 Estrogen receptor negative status [ER-]: Secondary | ICD-10-CM

## 2020-04-09 DIAGNOSIS — Z800 Family history of malignant neoplasm of digestive organs: Secondary | ICD-10-CM | POA: Insufficient documentation

## 2020-04-09 DIAGNOSIS — Z853 Personal history of malignant neoplasm of breast: Secondary | ICD-10-CM | POA: Diagnosis not present

## 2020-04-09 DIAGNOSIS — Z923 Personal history of irradiation: Secondary | ICD-10-CM | POA: Insufficient documentation

## 2020-04-09 DIAGNOSIS — Z801 Family history of malignant neoplasm of trachea, bronchus and lung: Secondary | ICD-10-CM | POA: Diagnosis not present

## 2020-04-09 DIAGNOSIS — R61 Generalized hyperhidrosis: Secondary | ICD-10-CM | POA: Insufficient documentation

## 2020-04-09 LAB — CBC WITH DIFFERENTIAL/PLATELET
Abs Immature Granulocytes: 0 10*3/uL (ref 0.00–0.07)
Basophils Absolute: 0 10*3/uL (ref 0.0–0.1)
Basophils Relative: 1 %
Eosinophils Absolute: 0.1 10*3/uL (ref 0.0–0.5)
Eosinophils Relative: 3 %
HCT: 39.7 % (ref 36.0–46.0)
Hemoglobin: 12.9 g/dL (ref 12.0–15.0)
Immature Granulocytes: 0 %
Lymphocytes Relative: 33 %
Lymphs Abs: 1.2 10*3/uL (ref 0.7–4.0)
MCH: 30.6 pg (ref 26.0–34.0)
MCHC: 32.5 g/dL (ref 30.0–36.0)
MCV: 94.1 fL (ref 80.0–100.0)
Monocytes Absolute: 0.3 10*3/uL (ref 0.1–1.0)
Monocytes Relative: 8 %
Neutro Abs: 1.9 10*3/uL (ref 1.7–7.7)
Neutrophils Relative %: 55 %
Platelets: 138 10*3/uL — ABNORMAL LOW (ref 150–400)
RBC: 4.22 MIL/uL (ref 3.87–5.11)
RDW: 12.6 % (ref 11.5–15.5)
WBC: 3.6 10*3/uL — ABNORMAL LOW (ref 4.0–10.5)
nRBC: 0 % (ref 0.0–0.2)

## 2020-04-09 LAB — COMPREHENSIVE METABOLIC PANEL
ALT: 17 U/L (ref 0–44)
AST: 22 U/L (ref 15–41)
Albumin: 4.1 g/dL (ref 3.5–5.0)
Alkaline Phosphatase: 72 U/L (ref 38–126)
Anion gap: 8 (ref 5–15)
BUN: 8 mg/dL (ref 6–20)
CO2: 28 mmol/L (ref 22–32)
Calcium: 9.4 mg/dL (ref 8.9–10.3)
Chloride: 107 mmol/L (ref 98–111)
Creatinine, Ser: 0.73 mg/dL (ref 0.44–1.00)
GFR calc Af Amer: >60 mL/min (ref >60)
GFR calc non Af Amer: >60 mL/min (ref >60)
Glucose, Bld: 90 mg/dL (ref 70–99)
Potassium: 4.1 mmol/L (ref 3.5–5.1)
Sodium: 143 mmol/L (ref 135–145)
Total Bilirubin: 0.5 mg/dL (ref 0.3–1.2)
Total Protein: 6.9 g/dL (ref 6.5–8.1)

## 2020-04-09 MED ORDER — GABAPENTIN 300 MG PO CAPS: 300.0000 mg | ORAL_CAPSULE | Freq: Every day | ORAL | 4 refills | Status: DC

## 2020-04-09 NOTE — Telephone Encounter (Signed)
Scheduled appts per 4/12 sch msg. Left voicemail with appt details.

## 2020-09-18 ENCOUNTER — Encounter: Payer: Self-pay | Admitting: Plastic Surgery

## 2020-09-18 ENCOUNTER — Other Ambulatory Visit: Payer: Self-pay

## 2020-09-18 ENCOUNTER — Ambulatory Visit: Payer: BC Managed Care – PPO | Admitting: Plastic Surgery

## 2020-09-18 VITALS — BP 133/88 | HR 61 | Temp 98.2°F | Ht 61.0 in | Wt 128.8 lb

## 2020-09-18 DIAGNOSIS — Z9012 Acquired absence of left breast and nipple: Secondary | ICD-10-CM

## 2020-09-18 DIAGNOSIS — Z95828 Presence of other vascular implants and grafts: Secondary | ICD-10-CM | POA: Diagnosis not present

## 2020-09-18 DIAGNOSIS — C50512 Malignant neoplasm of lower-outer quadrant of left female breast: Secondary | ICD-10-CM

## 2020-09-18 DIAGNOSIS — Z171 Estrogen receptor negative status [ER-]: Secondary | ICD-10-CM

## 2020-09-18 DIAGNOSIS — Z9010 Acquired absence of unspecified breast and nipple: Secondary | ICD-10-CM | POA: Insufficient documentation

## 2020-09-18 NOTE — Progress Notes (Signed)
Patient ID: Monica Padilla, female    DOB: 22-Mar-1966, 54 y.o.   MRN: [id number]   Chief Complaint  Patient presents with  . Advice Only  . Breast Problem    The patient is a 54 year old white female here for evaluation for breast reconstruction.  She was diagnosed with left breast cancer March 2019.  The biopsy showed invasive ductal carcinoma, grade 2.  It was estrogen and progesterone negative and HER-2 positive with Ki67=80%.  This was found after she palpated a mass in the left breast.  She had a left mastectomy with sentinel lymph node sampling in April 2019 with Dr. Glynda Jaeger.  She then had radiation to the left breast.  This ended in November 2019.  One of the 3 sentinel nodes was positive.  Her Port-A-Cath was placed at the same time.  She has a history of radiation therapy, chemotherapy, gastroesophageal reflux disease and basal cell carcinoma on her chest and her leg.  She is 5 feet 1 inch tall and weighs 128 pounds.  Preoperative bra 34B.  She is not a smoker and does not have diabetes.  She does have a family history of breast cancer and had a hysterectomy when she was 54 years old.  Her skin is in good shape and that healed nicely from the surgery and the radiation on the left breast.  It is a little bit tight and I probably could not expand it without bringing in healthy tissue.  She has a husband for support.   Review of Systems  Constitutional: Negative.  Negative for activity change.  HENT: Negative.   Eyes: Negative.   Respiratory: Negative.  Negative for chest tightness.   Cardiovascular: Negative.  Negative for leg swelling.  Gastrointestinal: Negative.  Negative for abdominal distention.  Endocrine: Negative.   Genitourinary: Negative.   Musculoskeletal: Negative.   Hematological: Negative.   Psychiatric/Behavioral: Negative.     Past Medical History:  Diagnosis Date  . Breast cancer ([place])    left   . Cancer ([facility])    basal cell CA- chest, leg  . Family history  of breast cancer   . Family history of colon cancer   . Family history of lung cancer   . GERD (gastroesophageal reflux disease)   . History of chemotherapy    finished chemo 08-26-2018  . History of radiation therapy    last 11-01-2018  . Hx of colonic polyp 02/08/2019    Past Surgical History:  Procedure Laterality Date  . CESAREAN SECTION  1998   x1  . COLONOSCOPY    . DILATION AND CURETTAGE OF UTERUS    . fatty tumor removal  06/2016   lower right back   . left mastectomy Left 03/2018  . PARTIAL HYSTERECTOMY    . SKIN CANCER EXCISION    . VAGINAL DELIVERY     x1  . WISDOM TOOTH EXTRACTION        Current Outpatient Medications:  .  alendronate (FOSAMAX) 70 MG tablet, Take 1 tablet (70 mg total) by mouth once a week. Take with a full glass of water on an empty stomach., Disp:  , Rfl:  .  gabapentin (NEURONTIN) 300 MG capsule, Take 1 capsule (300 mg total) by mouth at bedtime., Disp: 90 capsule, Rfl: 4 .  Multiple Vitamins-Minerals (WOMENS MULTI VITAMIN & MINERAL) TABS, Take 1 each by mouth., Disp: , Rfl:  .  cholecalciferol (VITAMIN D3) 25 MCG (1000 UNIT) tablet, Take 5 tablets (5,000 Units  total) by mouth daily., Disp: , Rfl:  .  pantoprazole (PROTONIX) 40 MG tablet, Take by mouth. As needed, Disp: , Rfl:    Objective:   Vitals:   09/18/20 1143  BP: 133/88  Pulse: 61  Temp: 98.2 F (36.8 C)  SpO2: 99%    Physical Exam Vitals and nursing note reviewed.  Constitutional:      Appearance: Normal appearance.  HENT:     Head: Normocephalic and atraumatic.  Eyes:     Extraocular Movements: Extraocular movements intact.  Cardiovascular:     Rate and Rhythm: Normal rate.     Pulses: Normal pulses.  Pulmonary:     Effort: Pulmonary effort is normal.  Abdominal:     General: Abdomen is flat. There is no distension.  Skin:    General: Skin is warm.     Capillary Refill: Capillary refill takes less than 2 seconds.  Neurological:     General: No focal deficit  present.     Mental Status: She is alert and oriented to person, place, and time.  Psychiatric:        Mood and Affect: Mood normal.        Behavior: Behavior normal.        Thought Content: Thought content normal.     Assessment & Plan:  Malignant neoplasm of lower-outer quadrant of left breast of female, estrogen receptor negative ([place])  Port-A-Cath in place  Acquired absence of left breast  We had a detailed conversation about the patient's options for breast reconstruction. Several reconstruction options were explained to the patient.  It is important to remember that breast reconstruction is an optional procedure. Reconstruction often requires several stages of surgery and this means more than one operation.  The surgeries are often done several months apart.  The entire process from start to finish can take a year or more. The major goal of breast reconstruction is to look normal in clothing. There will always be scars and a difference noticeable without clothes.  This is true for asymmetries where both breasts will not be identical.  Surgery may be needed or desired to the non-cancerous breast in order to achieve better symmetry and satisfactory results.  Regardless of the reconstructive method, there is always risks and the possibility that the procedure will fail or have complications.  This couls required additional surgeries.    We discussed the available methods of breast reconstruction and included:  1. Tissue expander with Acellular dermal matrix followed by implant based reconstruction. This can be done as one surgery or multiple surgeries.  2. Autologous reconstruction can include using a muscle or tissue from another area of the body for the reconstruction.  3. Combined procedures like the latissismus dorsi flaps that often uses the muscle with an expander or implant.  For each of the method discussed the risks, benefits, scars and recovery time were discussed in detail.  Specific risks included bleeding, infection, hematoma, seroma, scarring, pain, wound healing complications, flap loss, fat necrosis, capsular contracture, need for implant removal, donor site complications, bulge, hernia, umbilical necrosis, need for urgent reoperation, and need for dressing changes.   After the options were discussed we focused on the patient's desires and the procedure that was best for her based on all the information.  A total of 50 minutes of face-to-face time was spent in this encounter, of which >50% was spent in counseling.   The patient has several friends that underwent either a TRAM or Diep flap and  had some serious complications.  She does not want to have that surgery.  She was thinking about implants only.  She understands that because of her radiation she will need to have some sort of tissue brought in.  After the lengthy discussion she is most likely interested in a left latissimus muscle flap with expander.  In 3 months she can have the expander exchanged for an implant and have the right breast revised to have better symmetry.  She is going to think it over and reviewed the brochure.  We will do a telemetry visit in 3 to 4 weeks and see if she has any further questions.  Pictures were obtained of the patient and placed in the chart with the patient's or guardian's permission.   [place], DO

## 2020-10-16 ENCOUNTER — Telehealth: Payer: BC Managed Care – PPO | Admitting: Plastic Surgery

## 2021-02-10 NOTE — Progress Notes (Signed)
[place]  [phone] [phone]) [phone]    ID: HALIEY ROMBERG DOB: 11/18/1966  MR#: [id number]  HXT#:[id number]  Patient Care Team: Algis Greenhouse, MD as PCP - General (Family Medicine) Harbor Paster, Virgie Dad, MD as Consulting Physician (Oncology) Noberto Retort Juanda Bond., MD as Referring Physician (Surgery) Gatha Mayer, MD as Consulting Physician (Gastroenterology) Nolon Nations, MD as Consulting Physician (Diagnostic Radiology) Gery Pray, MD as Consulting Physician (Radiation Oncology) Marylynn Pearson, MD as Consulting Physician (Obstetrics and Gynecology) OTHER MD: Dr. Jimmye Norman and Dr. Michele Mcalpine at [facility]: Estrogen receptor negative, HER-2 positive breast cancer (s/p left mastectomy)  CURRENT TREATMENT: observation   INTERVAL HISTORY: Calianna returns today for follow-up of her estrogen receptor negative, HER-2 positive breast cancer. She continues on observation.  She gets her right mammograms done at Dr. Danae Chen office.  She tells me "it was fine" as far as her mammogram is concerned.   REVIEW OF SYSTEMS: Rashonda is working part-time Monday through Friday, and enjoying it.  She is planning a trip to the [facility] in that area with her husband, younger son, younger son's wife and the [place].  She is very excited.  A detailed review of systems today was otherwise entirely benign   COVID 19 VACCINATION STATUS: Status post [place] x2, most recently in September; not yet boosted as of February 2022   HISTORY OF CURRENT ILLNESS: From the original intake note:  The patient noted a palpable mass in her left breast as she was laying down. She brought this to medical attention immediately and had left diagnostic mammography with tomography at breast center  03/11/2018 showing a 2.5 cm palpable mass in the lower outer quadrant of the left breast, with one slightly prominent lymph node that had increased cortical  measurement.  Biopsy of this left breast mass 03/17/2018 showed ([id number]), invasive ductal carcinoma, grade 2, estrogen and progesterone receptor negative, with an MIB-1 of 80%, but HER-2 amplified with a signals ratio 5.51 and the number per cell 17.35.  She underwent left mastectomy and sentinel lymph node sampling, as well as right-sided port placement 04/02/2018 under Etheleen Mayhew at [facility] for a 2.6 cm invasive ductal carcinoma, grade 3, with 1 of 3 sentinel nodes showing a micrometastatic deposit, but all margins negative.  She had a port placed at the same time.  The patient's subsequent history is as detailed below.   PAST MEDICAL HISTORY: Past Medical History:  Diagnosis Date  . Breast cancer ([place])    left   . Cancer ([facility])    basal cell CA- chest, leg  . Family history of breast cancer   . Family history of colon cancer   . Family history of lung cancer   . GERD (gastroesophageal reflux disease)   . History of chemotherapy    finished chemo 08-26-2018  . History of radiation therapy    last 11-01-2018  . Hx of colonic polyp 02/08/2019    PAST SURGICAL HISTORY: Past Surgical History:  Procedure Laterality Date  . CESAREAN SECTION  1998   x1  . COLONOSCOPY    . DILATION AND CURETTAGE OF UTERUS    . fatty tumor removal  06/2016   lower right back   . left mastectomy Left 03/2018  . PARTIAL HYSTERECTOMY    . SKIN CANCER EXCISION    . VAGINAL DELIVERY     x1  . WISDOM TOOTH EXTRACTION      FAMILY HISTORY: Family History  Problem Relation Age of Onset  . Colon cancer Father 6  . Basal cell carcinoma Sister        2-3  . Lung cancer Maternal Aunt        hx smoking  . Lung cancer Paternal Uncle        hx smoking  . Heart disease Maternal Grandmother   . Heart disease Maternal Grandfather   . Cancer Paternal Grandfather        unk if colon or stomach? died in early 75's.   . Breast cancer Cousin   . Esophageal cancer Neg Hx   . Stomach cancer Neg Hx    . Rectal cancer Neg Hx   . Colon polyps Neg Hx   The patient's father died due to colon cancer at age 75. The patient's mother is alive at age 73. The patient has 1 sister and no brothers. A paternal cousin was diagnosed with breast cancer in the early 61's (before 37). The patient's paternal grandfather passed away from colon cancer. The patient's father had 3 brothers, one of whom had lung cancer.    GYNECOLOGIC HISTORY:  No LMP recorded. Patient has had a hysterectomy. Menarche: 55 years old Age at first live birth: 55 years old She is GXP2.  The patient is status post partial hysterectomy due to uterine fibroids. She retains her cervix and ovaries.  After her hysterectomy she used Estradiol with no complications.  She stopped hormone replacement March 2019.  She is now having hot flashes and nigh sweats that only allow her to sleep 3 hours at a time.    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 Her husband, [name], is a Hydrologist for alarms and cameras for [employer]. The patient's oldest son, [name],  works as a Civil engineer, contracting in [place]; his wife is also an Chief Financial Officer.. The patient's younger son, [name], graduated from [place] in Hotel manager a Dispensing optician.  His wife works for [employer].  Caralee does not yet have any grandchildren. She attends [employer].    ADVANCED DIRECTIVES: In the absence of any documentation to the contrary, the patient's spouse is their HCPOA.    HEALTH MAINTENANCE: Social History   Tobacco Use  . Smoking status: Never Smoker  . Smokeless tobacco: Never Used  Vaping Use  . Vaping Use: Never used  Substance Use Topics  . Alcohol use: Yes    Comment: occasional  . Drug use: No     Colonoscopy: 01/2019 (Dr. Carlean Purl), recall 2025  PAP:  Bone density: remote   Allergies   Allergen Reactions  . Codeine Itching  . Nitrofurantoin     REACTION: pruritis  . Amoxicillin Rash  . Pertuzumab Rash    Current Outpatient Medications  Medication Sig Dispense Refill  . alendronate (FOSAMAX) 70 MG tablet Take 1 tablet (70 mg total) by mouth once a week. Take with a full glass of water on an empty stomach.    . cholecalciferol (VITAMIN D3) 25 MCG (1000 UNIT) tablet Take 5 tablets (5,000 Units total) by mouth daily.    Marland Kitchen gabapentin (NEURONTIN) 300 MG capsule Take 1 capsule (300 mg total) by mouth at bedtime. 90 capsule 4  . Multiple Vitamins-Minerals (WOMENS MULTI VITAMIN & MINERAL) TABS Take 1 each by mouth.    . pantoprazole (PROTONIX) 40 MG tablet Take by  mouth. As needed     No current facility-administered medications for this visit.    OBJECTIVE:  white woman who appears younger than stated age  35:   02/11/21 1016  BP: 126/75  Pulse: 69  Resp: 15  Temp: 97.7 F (36.5 C)  SpO2: 99%     Body mass index is 24.81 kg/m.   Wt Readings from Last 3 Encounters:  02/11/21 131 lb 4.8 oz (59.6 kg)  09/18/20 128 lb 12.8 oz (58.4 kg)  04/09/20 121 lb 11.2 oz (55.2 kg)      ECOG FS:1  Sclerae unicteric, EOMs intact Wearing a mask No cervical or supraclavicular adenopathy Lungs no rales or rhonchi Heart regular rate and rhythm Abd soft, nontender, positive bowel sounds MSK no focal spinal tenderness, no upper extremity lymphedema Neuro: nonfocal, well oriented, appropriate affect Breasts: The right breast is benign.  The left breast is status post mastectomy.  There is no evidence of local recurrence.  Both axillae are benign   LAB RESULTS:  CMP     Component Value Date/Time   NA 143 04/09/2020 0933   K 4.1 04/09/2020 0933   CL 107 04/09/2020 0933   CO2 28 04/09/2020 0933   GLUCOSE 90 04/09/2020 0933   BUN 8 04/09/2020 0933   CREATININE 0.73 04/09/2020 0933   CREATININE 0.79 11/07/2019 1612   CALCIUM 9.4 04/09/2020 0933   PROT 6.9  04/09/2020 0933   ALBUMIN 4.1 04/09/2020 0933   AST 22 04/09/2020 0933   AST 24 11/07/2019 1612   ALT 17 04/09/2020 0933   ALT 32 11/07/2019 1612   ALKPHOS 72 04/09/2020 0933   BILITOT 0.5 04/09/2020 0933   BILITOT 0.3 11/07/2019 1612   GFRNONAA >60 04/09/2020 0933   GFRNONAA >60 11/07/2019 1612   GFRAA >60 04/09/2020 0933   GFRAA >60 11/07/2019 1612    No results found for: TOTALPROTELP, ALBUMINELP, A1GS, A2GS, BETS, BETA2SER, GAMS, MSPIKE, SPEI  No results found for: KPAFRELGTCHN, LAMBDASER, KAPLAMBRATIO  Lab Results  Component Value Date   WBC 4.5 02/11/2021   NEUTROABS 2.5 02/11/2021   HGB 13.3 02/11/2021   HCT 39.1 02/11/2021   MCV 91.4 02/11/2021   PLT 160 02/11/2021   No results found for: LABCA2  No components found for: [id number]  No results for input(s): INR in the last 168 hours.  No results found for: LABCA2  No results found for: XHB716  No results found for: RCV893  No results found for: YBO175  No results found for: CA2729  No components found for: HGQUANT  No results found for: CEA1 / No results found for: CEA1   No results found for: AFPTUMOR  No results found for: CHROMOGRNA  No results found for: HGBA, HGBA2QUANT, HGBFQUANT, HGBSQUAN (Hemoglobinopathy evaluation)   No results found for: LDH  No results found for: IRON, TIBC, IRONPCTSAT (Iron and TIBC)  No results found for: FERRITIN  Urinalysis No results found for: COLORURINE, APPEARANCEUR, LABSPEC, PHURINE, GLUCOSEU, HGBUR, BILIRUBINUR, KETONESUR, PROTEINUR, UROBILINOGEN, NITRITE, LEUKOCYTESUR   STUDIES: No results found.    ELIGIBLE FOR AVAILABLE RESEARCH PROTOCOL: not discussed   ASSESSMENT: 55 y.o. [place], [place] woman status post left mastectomy and sentinel lymph node sampling 04/02/2018 for a pT2 pN1, stage IIB invasive ductal carcinoma, grade 3, estrogen and progesterone receptor negative, but HER-2 amplified  (a) not planning on reconstruction  (1) genetics testing  07/14/2018 through [facility] found no deleterious mutations in APC, ATM, AXIN2, BAP1, BARD1, BLM, BMPR1A, BRCA1, BRCA2, BRIP1, BUB1B, CDC73,  CDH1, CDK4, CDKN1C, CDKN2A (p14ARF), CDKN2A (p16INK4a), CEP57, CHEK2, CTNNA1, DICER1, DIS3L2, ENG, EPCAM*, FH, FLCN, GALNT12, GPC3, GREM1*, KIT, MEN1, MET, MLH1, MLH3, MSH2, MSH3, MSH6, MUTYH, NBN, NF1, PALB2, PDGFRA, PMS2, POLD1, POLE, PTEN, RAD50, RAD51C, RAD51D, RPS20, SDHB, SDHC, SDHD, SMAD4, SMARCA4, SMARCB1, STK11, TP53, TSC1, TSC2, VHL, WT1. The following genes were evaluated for sequence changes only: EGFR*, HOXB13*, NTHL1*, SDHA  (a) A variant of uncertain significance in [facility] was identified c.4913A>G (p.[id number]  (2) adjuvant chemotherapy consisting of carboplatin and docetaxel given every 21 days x 6, started 05/13/2018, completed 08/26/2018  (3) trastuzumab and Pertuzumab started 05/13/2018; trastuzumab continued to complete a year--last dose 05/05/2019  (a) pertuzumab discontinued after cycle 2 because of allergic reactions  (b) echocardiogram 05/06/2018 shows an ejection fraction in the 65-70%  (c) echocardiogram 08/12/2018 shows an ejection fraction in the 65-70%  (d) echocardiogram 11/10/2018 shows an ejection fraction in the 60-65% range  (e) echo 02/14/2019 shows an ejection fraction in the 60-65% range  (f) echo 05/25/2019 shows EF of 60-65%  (4) adjuvant radiation 09/06/18-11/01/18 Site/dose:  1. Left Axilla, 1.8 Gy in 25 fractions for a total of 45 Gy                     2. Left CW, 1.8 Gy in 28 fractions for a total of 50.4 Gy                     3. Boost, 2 Gy in 5 fractions for a total of 10 Gy   PLAN: Cree is coming up on 3 years from definitive surgery for her breast cancer.  This is particularly favorable because estrogen receptor negative tumors if they are going to recur tend to recur early  I wrote her a new prescription for bras and prostheses which she may use at her discretion.  She is tolerating alendronate well.   This is expected to have a slight benefit in terms of breast cancer breast reduction in addition to of course helping the bones  I gave her several choices regarding follow-up next year.  She decided she would like to be followed by Dr. Sonny Dandy and I have placed that appointment for her  She knows to call for any other issue that may develop before the next scheduled visit  Total encounter time 25 minutes.*  Sathvika Ojo, Virgie Dad, MD  02/11/21 10:30 AM Medical Oncology and Hematology [facility] [place], [place] [address] Tel. [phone]    Fax. [phone]    I, Wilburn Mylar, am acting as scribe for Dr. Virgie Dad. Jia Dottavio.  I, Lurline Del MD, have reviewed the above documentation for accuracy and completeness, and I agree with the above.   *Total Encounter Time as defined by the Centers for Medicare and Medicaid Services includes, in addition to the face-to-face time of a patient visit (documented in the note above) non-face-to-face time: obtaining and reviewing outside history, ordering and reviewing medications, tests or procedures, care coordination (communications with other health care professionals or caregivers) and documentation in the medical record.

## 2021-02-11 ENCOUNTER — Inpatient Hospital Stay: Payer: BC Managed Care – PPO

## 2021-02-11 ENCOUNTER — Other Ambulatory Visit: Payer: Self-pay

## 2021-02-11 ENCOUNTER — Inpatient Hospital Stay: Payer: BC Managed Care – PPO | Attending: Oncology | Admitting: Oncology

## 2021-02-11 VITALS — BP 126/75 | HR 69 | Temp 97.7°F | Resp 15 | Ht 61.0 in | Wt 131.3 lb

## 2021-02-11 DIAGNOSIS — Z171 Estrogen receptor negative status [ER-]: Secondary | ICD-10-CM | POA: Diagnosis not present

## 2021-02-11 DIAGNOSIS — Z923 Personal history of irradiation: Secondary | ICD-10-CM | POA: Diagnosis not present

## 2021-02-11 DIAGNOSIS — Z9012 Acquired absence of left breast and nipple: Secondary | ICD-10-CM | POA: Insufficient documentation

## 2021-02-11 DIAGNOSIS — Z9221 Personal history of antineoplastic chemotherapy: Secondary | ICD-10-CM | POA: Insufficient documentation

## 2021-02-11 DIAGNOSIS — Z801 Family history of malignant neoplasm of trachea, bronchus and lung: Secondary | ICD-10-CM | POA: Diagnosis not present

## 2021-02-11 DIAGNOSIS — C50512 Malignant neoplasm of lower-outer quadrant of left female breast: Secondary | ICD-10-CM

## 2021-02-11 DIAGNOSIS — Z79899 Other long term (current) drug therapy: Secondary | ICD-10-CM | POA: Insufficient documentation

## 2021-02-11 DIAGNOSIS — Z803 Family history of malignant neoplasm of breast: Secondary | ICD-10-CM | POA: Diagnosis not present

## 2021-02-11 DIAGNOSIS — Z853 Personal history of malignant neoplasm of breast: Secondary | ICD-10-CM | POA: Diagnosis not present

## 2021-02-11 DIAGNOSIS — Z800 Family history of malignant neoplasm of digestive organs: Secondary | ICD-10-CM | POA: Insufficient documentation

## 2021-02-11 LAB — CBC WITH DIFFERENTIAL/PLATELET
Abs Immature Granulocytes: 0.01 10*3/uL (ref 0.00–0.07)
Basophils Absolute: 0 10*3/uL (ref 0.0–0.1)
Basophils Relative: 1 %
Eosinophils Absolute: 0.1 10*3/uL (ref 0.0–0.5)
Eosinophils Relative: 2 %
HCT: 39.1 % (ref 36.0–46.0)
Hemoglobin: 13.3 g/dL (ref 12.0–15.0)
Immature Granulocytes: 0 %
Lymphocytes Relative: 34 %
Lymphs Abs: 1.6 10*3/uL (ref 0.7–4.0)
MCH: 31.1 pg (ref 26.0–34.0)
MCHC: 34 g/dL (ref 30.0–36.0)
MCV: 91.4 fL (ref 80.0–100.0)
Monocytes Absolute: 0.4 10*3/uL (ref 0.1–1.0)
Monocytes Relative: 8 %
Neutro Abs: 2.5 10*3/uL (ref 1.7–7.7)
Neutrophils Relative %: 55 %
Platelets: 160 10*3/uL (ref 150–400)
RBC: 4.28 MIL/uL (ref 3.87–5.11)
RDW: 12.7 % (ref 11.5–15.5)
WBC: 4.5 10*3/uL (ref 4.0–10.5)
nRBC: 0 % (ref 0.0–0.2)

## 2021-02-11 LAB — COMPREHENSIVE METABOLIC PANEL
ALT: 24 U/L (ref 0–44)
AST: 23 U/L (ref 15–41)
Albumin: 4.3 g/dL (ref 3.5–5.0)
Alkaline Phosphatase: 92 U/L (ref 38–126)
Anion gap: 5 (ref 5–15)
BUN: 10 mg/dL (ref 6–20)
CO2: 29 mmol/L (ref 22–32)
Calcium: 9.8 mg/dL (ref 8.9–10.3)
Chloride: 107 mmol/L (ref 98–111)
Creatinine, Ser: 0.74 mg/dL (ref 0.44–1.00)
GFR, Estimated: >60 mL/min (ref >60)
Glucose, Bld: 91 mg/dL (ref 70–99)
Potassium: 4.5 mmol/L (ref 3.5–5.1)
Sodium: 141 mmol/L (ref 135–145)
Total Bilirubin: 0.4 mg/dL (ref 0.3–1.2)
Total Protein: 7.4 g/dL (ref 6.5–8.1)

## 2021-02-12 ENCOUNTER — Telehealth: Payer: Self-pay | Admitting: Hematology and Oncology

## 2021-02-12 NOTE — Telephone Encounter (Signed)
Scheduled appts per 2/14 los. Pt confirmed appt date and time.

## 2021-05-27 ENCOUNTER — Other Ambulatory Visit: Payer: Self-pay | Admitting: Oncology

## 2022-02-11 ENCOUNTER — Other Ambulatory Visit: Payer: Self-pay

## 2022-02-11 DIAGNOSIS — C50512 Malignant neoplasm of lower-outer quadrant of left female breast: Secondary | ICD-10-CM

## 2022-02-11 DIAGNOSIS — Z171 Estrogen receptor negative status [ER-]: Secondary | ICD-10-CM

## 2022-02-11 NOTE — Assessment & Plan Note (Signed)
left mastectomy and sentinel lymph node sampling 04/02/2018 for a pT2 pN1, stage IIB invasive ductal carcinoma, grade 3, estrogen and progesterone receptor negative, but HER-2 amplified  adjuvant chemotherapy consisting of carboplatin and docetaxel given every 21 days x 6, started 05/13/2018, completed 08/26/2018 trastuzumab and Pertuzumab started 05/13/2018; trastuzumab continued to complete a year--last dose 05/05/2019 adjuvant radiation 09/06/18-11/01/18  Breast Cancer Surveillance: 1. Breast Exam: 02/12/22: Benign 2. Mammogram:

## 2022-02-11 NOTE — Progress Notes (Signed)
Patient Care Team: Algis Greenhouse, MD as PCP - General (Family Medicine) Magrinat, Virgie Dad, MD (Inactive) as Consulting Physician (Oncology) Marylu Lund., MD as Referring Physician (Surgery) Gatha Mayer, MD as Consulting Physician (Gastroenterology) Nolon Nations, MD as Consulting Physician (Diagnostic Radiology) Gery Pray, MD as Consulting Physician (Radiation Oncology) Marylynn Pearson, MD as Consulting Physician (Obstetrics and Gynecology)  DIAGNOSIS:    ICD-10-CM   1. Malignant neoplasm of lower-outer quadrant of left breast of female, estrogen receptor negative ([place])  C50.512    Z17.1       SUMMARY OF ONCOLOGIC HISTORY: Oncology History  Malignant neoplasm of lower-outer quadrant of left breast of female, estrogen receptor negative ([place])  04/02/2018 Initial Diagnosis   56 y.o. [place], [place] woman status post left mastectomy and sentinel lymph node sampling 04/02/2018 for a pT2 pN1, stage IIB invasive ductal carcinoma, grade 3, estrogen and progesterone receptor negative, but HER-2 amplified             (a) not planning on reconstruction   04/02/2018 Cancer Staging   Staging form: Breast, AJCC 8th Edition - Pathologic stage from 04/02/2018: Stage IIB (pT2, pN1, cM0, G3, ER-, PR-, HER2+) - Signed by Gardenia Phlegm, NP on 09/20/2019    05/13/2018 -  Chemotherapy   Docetaxel and Carboplatin given every 3 weeks x 6 cycles, with gemcitabine substituted for docetaxel with cycle 6 due to neuropathy.  Trastuzumab and Pertuzumab given for 1 year 04/2018 through 04/2019   07/14/2018 Genetic Testing   A 65 gene panel was ordered from the laboratory [facility]. The following genes were evaluated for sequence changes and exonic deletions/duplications: APC, ATM, AXIN2, BAP1, BARD1, BLM, BMPR1A, BRCA1, BRCA2, BRIP1, BUB1B, CDC73, CDH1, CDK4, CDKN1C, CDKN2A (p14ARF), CDKN2A (p16INK4a), CEP57, CHEK2, CTNNA1, DICER1, DIS3L2, ENG, EPCAM*, FH, FLCN, GALNT12, GPC3, GREM1*, KIT,  MEN1, MET, MLH1, MLH3, MSH2, MSH3, MSH6, MUTYH, NBN, NF1, PALB2, PDGFRA, PMS2, POLD1, POLE, PTEN, RAD50, RAD51C, RAD51D, RPS20, SDHB, SDHC, SDHD, SMAD4, SMARCA4, SMARCB1, STK11, TP53, TSC1, TSC2, VHL, WT1. The following genes were evaluated for sequence changes only: EGFR*, HOXB13*, NTHL1*, SDHA  Results: No pathogenic variants identified.  A variant of uncertain significance in the gene SMARCA4 was identified c.4913A>G (p.[id number]).  The date of this teste report is 07/14/2018.    09/06/2018 - 11/01/2018 Radiation Therapy   Site/dose:  1. Left Axilla, 1.8 Gy in 25 fractions for a total of 45 Gy                     2. Left CW, 1.8 Gy in 28 fractions for a total of 50.4 Gy                     3. Boost, 2 Gy in 5 fractions for a total of 10 Gy     CHIEF COMPLIANT: Follow-up of estrogen receptor negative, HER-2 positive breast cancer  INTERVAL HISTORY: Monica Padilla is a 56 y.o. with above-mentioned history of estrogen receptor negative, HER-2 positive breast cancer. She presents to the clinic today for follow-up.  She reports no new problems or concerns.  Denies any lumps or nodules in the breast.  ALLERGIES:  is allergic to codeine, nitrofurantoin, amoxicillin, and pertuzumab.  MEDICATIONS:  Current Outpatient Medications  Medication Sig Dispense Refill   alendronate (FOSAMAX) 70 MG tablet Take 1 tablet (70 mg total) by mouth once a week. Take with a full glass of water on an empty stomach.     cholecalciferol (  VITAMIN D3) 25 MCG (1000 UNIT) tablet Take 5 tablets (5,000 Units total) by mouth daily.     gabapentin (NEURONTIN) 300 MG capsule TAKE 1 CAPSULE BY MOUTH AT BEDTIME. 90 capsule 4   Multiple Vitamins-Minerals (WOMENS MULTI VITAMIN & MINERAL) TABS Take 1 each by mouth.     pantoprazole (PROTONIX) 40 MG tablet Take by mouth. As needed     No current facility-administered medications for this visit.    PHYSICAL EXAMINATION: ECOG PERFORMANCE STATUS: 1 - Symptomatic but completely  ambulatory  Vitals:   02/12/22 1124  BP: (!) 141/84  Pulse: 67  Resp: 18  Temp: 97.7 F (36.5 C)  SpO2: 99%   Filed Weights   02/12/22 1124  Weight: 127 lb (57.6 kg)    BREAST: No palpable masses or nodules in either right or left breasts. No palpable axillary supraclavicular or infraclavicular adenopathy no breast tenderness or nipple discharge. (exam performed in the presence of a chaperone)  LABORATORY DATA:  I have reviewed the data as listed CMP Latest Ref Rng & Units 02/12/2022 02/11/2021 04/09/2020  Glucose 70 - 99 mg/dL 96 91 90  BUN 6 - 20 mg/dL _0 Creatinine 0.44 - 1.00 mg/dL 0.71 0.74 0.73  Sodium 135 - 145 mmol/L 139 141 143  Potassium 3.5 - 5.1 mmol/L 4.5 4.5 4.1  Chloride 98 - 111 mmol/L 104 107 107  CO2 22 - 32 mmol/L _1 Calcium 8.9 - 10.3 mg/dL 10.2 9.8 9.4  Total Protein 6.5 - 8.1 g/dL 7.3 7.4 6.9  Total Bilirubin 0.3 - 1.2 mg/dL 0.6 0.4 0.5  Alkaline Phos 38 - 126 U/L 78 92 72  AST 15 - 41 U/L _2 ALT 0 - 44 U/L _3 Lab Results  Component Value Date   WBC 4.4 02/12/2022   HGB 13.8 02/12/2022   HCT 39.5 02/12/2022   MCV 88.4 02/12/2022   PLT 158 02/12/2022   NEUTROABS 2.5 02/12/2022    ASSESSMENT & PLAN:  Malignant neoplasm of lower-outer quadrant of left breast of female, estrogen receptor negative ([place]) left mastectomy and sentinel lymph node sampling 04/02/2018 for a pT2 pN1, stage IIB invasive ductal carcinoma, grade 3, estrogen and progesterone receptor negative, but HER-2 amplified  adjuvant chemotherapy consisting of carboplatin and docetaxel given every 21 days x 6, started 05/13/2018, completed 08/26/2018 trastuzumab and Pertuzumab started 05/13/2018; trastuzumab continued to complete a year--last dose 05/05/2019 adjuvant radiation 09/06/18-11/01/18   Breast Cancer Surveillance: 1. Breast Exam: 02/12/22: Benign 2. [facility]  We will request a copy of the report.  Return to clinic in  1 year for follow-up  No orders of the defined types were placed in this encounter.  The patient has a good understanding of the overall plan. she agrees with it. she will call with any problems that may develop before the next visit here.  Total time spent: 20 mins including face to face time and time spent for planning, charting and coordination of care  Rulon Eisenmenger, MD, MPH 02/12/2022  I, Thana Ates, am acting as scribe for Dr. Nicholas Lose.  I have reviewed the above documentation for accuracy and completeness, and I agree with the above.

## 2022-02-12 ENCOUNTER — Inpatient Hospital Stay: Payer: BC Managed Care – PPO | Attending: Hematology and Oncology | Admitting: Hematology and Oncology

## 2022-02-12 ENCOUNTER — Other Ambulatory Visit: Payer: Self-pay

## 2022-02-12 ENCOUNTER — Inpatient Hospital Stay: Payer: BC Managed Care – PPO

## 2022-02-12 DIAGNOSIS — C50512 Malignant neoplasm of lower-outer quadrant of left female breast: Secondary | ICD-10-CM | POA: Diagnosis not present

## 2022-02-12 DIAGNOSIS — Z9221 Personal history of antineoplastic chemotherapy: Secondary | ICD-10-CM | POA: Diagnosis not present

## 2022-02-12 DIAGNOSIS — Z853 Personal history of malignant neoplasm of breast: Secondary | ICD-10-CM | POA: Diagnosis present

## 2022-02-12 DIAGNOSIS — Z171 Estrogen receptor negative status [ER-]: Secondary | ICD-10-CM

## 2022-02-12 LAB — CBC WITH DIFFERENTIAL (CANCER CENTER ONLY)
Abs Immature Granulocytes: 0 10*3/uL (ref 0.00–0.07)
Basophils Absolute: 0 10*3/uL (ref 0.0–0.1)
Basophils Relative: 1 %
Eosinophils Absolute: 0.1 10*3/uL (ref 0.0–0.5)
Eosinophils Relative: 2 %
HCT: 39.5 % (ref 36.0–46.0)
Hemoglobin: 13.8 g/dL (ref 12.0–15.0)
Immature Granulocytes: 0 %
Lymphocytes Relative: 33 %
Lymphs Abs: 1.4 10*3/uL (ref 0.7–4.0)
MCH: 30.9 pg (ref 26.0–34.0)
MCHC: 34.9 g/dL (ref 30.0–36.0)
MCV: 88.4 fL (ref 80.0–100.0)
Monocytes Absolute: 0.4 10*3/uL (ref 0.1–1.0)
Monocytes Relative: 8 %
Neutro Abs: 2.5 10*3/uL (ref 1.7–7.7)
Neutrophils Relative %: 56 %
Platelet Count: 158 10*3/uL (ref 150–400)
RBC: 4.47 MIL/uL (ref 3.87–5.11)
RDW: 12.3 % (ref 11.5–15.5)
WBC Count: 4.4 10*3/uL (ref 4.0–10.5)
nRBC: 0 % (ref 0.0–0.2)

## 2022-02-12 LAB — CMP (CANCER CENTER ONLY)
ALT: 25 U/L (ref 0–44)
AST: 27 U/L (ref 15–41)
Albumin: 4.6 g/dL (ref 3.5–5.0)
Alkaline Phosphatase: 78 U/L (ref 38–126)
Anion gap: 6 (ref 5–15)
BUN: 13 mg/dL (ref 6–20)
CO2: 29 mmol/L (ref 22–32)
Calcium: 10.2 mg/dL (ref 8.9–10.3)
Chloride: 104 mmol/L (ref 98–111)
Creatinine: 0.71 mg/dL (ref 0.44–1.00)
GFR, Estimated: >60 mL/min (ref >60)
Glucose, Bld: 96 mg/dL (ref 70–99)
Potassium: 4.5 mmol/L (ref 3.5–5.1)
Sodium: 139 mmol/L (ref 135–145)
Total Bilirubin: 0.6 mg/dL (ref 0.3–1.2)
Total Protein: 7.3 g/dL (ref 6.5–8.1)

## 2022-06-03 ENCOUNTER — Other Ambulatory Visit: Payer: Self-pay | Admitting: Oncology

## 2023-02-10 ENCOUNTER — Telehealth: Payer: Self-pay | Admitting: Hematology and Oncology

## 2023-02-10 NOTE — Telephone Encounter (Signed)
Contacted patient to scheduled appointments. Left message with appointment details and a call back number if patient had any questions or could not accommodate the time we provided.   

## 2023-02-12 ENCOUNTER — Ambulatory Visit: Payer: BC Managed Care – PPO | Admitting: Hematology and Oncology

## 2023-02-12 ENCOUNTER — Other Ambulatory Visit: Payer: BC Managed Care – PPO

## 2023-02-13 ENCOUNTER — Other Ambulatory Visit: Payer: Self-pay

## 2023-02-13 DIAGNOSIS — Z171 Estrogen receptor negative status [ER-]: Secondary | ICD-10-CM

## 2023-02-16 NOTE — Assessment & Plan Note (Signed)
left mastectomy and sentinel lymph node sampling 04/02/2018 for a pT2 pN1, stage IIB invasive ductal carcinoma, grade 3, estrogen and progesterone receptor negative, but HER-2 amplified   adjuvant chemotherapy consisting of carboplatin and docetaxel given every 21 days x 6, started 05/13/2018, completed 08/26/2018 trastuzumab and Pertuzumab started 05/13/2018; trastuzumab continued to complete a year--last dose 05/05/2019 adjuvant radiation 09/06/18-11/01/18    Breast Cancer Surveillance: 1. Breast Exam: 02/17/23: Benign 2. Mammogram: [facility]  We will request a copy of the report.   Return to clinic in 1 year for follow-up

## 2023-02-16 NOTE — Progress Notes (Signed)
Patient Care Team: Algis Greenhouse, MD as PCP - General (Family Medicine) Magrinat, Virgie Dad, MD (Inactive) as Consulting Physician (Oncology) Marylu Lund., MD as Referring Physician (Surgery) Gatha Mayer, MD as Consulting Physician (Gastroenterology) Nolon Nations, MD as Consulting Physician (Diagnostic Radiology) Gery Pray, MD as Consulting Physician (Radiation Oncology) Marylynn Pearson, MD as Consulting Physician (Obstetrics and Gynecology)  DIAGNOSIS: No diagnosis found.  SUMMARY OF ONCOLOGIC HISTORY: Oncology History  Malignant neoplasm of lower-outer quadrant of left breast of female, estrogen receptor negative ([place])  04/02/2018 Initial Diagnosis   57 y.o. [place], [place] woman status post left mastectomy and sentinel lymph node sampling 04/02/2018 for a pT2 pN1, stage IIB invasive ductal carcinoma, grade 3, estrogen and progesterone receptor negative, but HER-2 amplified             (a) not planning on reconstruction   04/02/2018 Cancer Staging   Staging form: Breast, AJCC 8th Edition - Pathologic stage from 04/02/2018: Stage IIB (pT2, pN1, cM0, G3, ER-, PR-, HER2+) - Signed by Gardenia Phlegm, NP on 09/20/2019   05/13/2018 -  Chemotherapy   Docetaxel and Carboplatin given every 3 weeks x 6 cycles, with gemcitabine substituted for docetaxel with cycle 6 due to neuropathy.  Trastuzumab and Pertuzumab given for 1 year 04/2018 through 04/2019   07/14/2018 Genetic Testing   A 65 gene panel was ordered from the laboratory [facility]. The following genes were evaluated for sequence changes and exonic deletions/duplications: APC, ATM, AXIN2, BAP1, BARD1, BLM, BMPR1A, BRCA1, BRCA2, BRIP1, BUB1B, CDC73, CDH1, CDK4, CDKN1C, CDKN2A (p14ARF), CDKN2A (p16INK4a), CEP57, CHEK2, CTNNA1, DICER1, DIS3L2, ENG, EPCAM*, FH, FLCN, GALNT12, GPC3, GREM1*, KIT, MEN1, MET, MLH1, MLH3, MSH2, MSH3, MSH6, MUTYH, NBN, NF1, PALB2, PDGFRA, PMS2, POLD1, POLE, PTEN, RAD50, RAD51C, RAD51D, RPS20, SDHB,  SDHC, SDHD, SMAD4, SMARCA4, SMARCB1, STK11, TP53, TSC1, TSC2, VHL, WT1. The following genes were evaluated for sequence changes only: EGFR*, HOXB13*, NTHL1*, SDHA  Results: No pathogenic variants identified.  A variant of uncertain significance in the gene SMARCA4 was identified c.4913A>G (p.[id number]).  The date of this teste report is 07/14/2018.    09/06/2018 - 11/01/2018 Radiation Therapy   Site/dose:  1. Left Axilla, 1.8 Gy in 25 fractions for a total of 45 Gy                     2. Left CW, 1.8 Gy in 28 fractions for a total of 50.4 Gy                     3. Boost, 2 Gy in 5 fractions for a total of 10 Gy     CHIEF COMPLIANT:   INTERVAL HISTORY: Monica Padilla is a   ALLERGIES:  is allergic to codeine, nitrofurantoin, amoxicillin, and pertuzumab.  MEDICATIONS:  Current Outpatient Medications  Medication Sig Dispense Refill   alendronate (FOSAMAX) 70 MG tablet Take 1 tablet (70 mg total) by mouth once a week. Take with a full glass of water on an empty stomach.     cholecalciferol (VITAMIN D3) 25 MCG (1000 UNIT) tablet Take 5 tablets (5,000 Units total) by mouth daily.     Multiple Vitamins-Minerals (WOMENS MULTI VITAMIN & MINERAL) TABS Take 1 each by mouth.     No current facility-administered medications for this visit.    PHYSICAL EXAMINATION: ECOG PERFORMANCE STATUS: {CHL ONC ECOG PS:[phone]}  There were no vitals filed for this visit. There were no vitals filed for this visit.  BREAST:*** No  palpable masses or nodules in either right or left breasts. No palpable axillary supraclavicular or infraclavicular adenopathy no breast tenderness or nipple discharge. (exam performed in the presence of a chaperone)  LABORATORY DATA:  I have reviewed the data as listed    Latest Ref Rng & Units 02/12/2022   10:10 AM 02/11/2021   10:02 AM 04/09/2020    9:33 AM  CMP  Glucose 70 - 99 mg/dL 96  91  90   BUN 6 - 20 mg/dL 13  10  8    Creatinine 0.44 - 1.00 mg/dL 0.71  0.74  0.73    Sodium 135 - 145 mmol/L 139  141  143   Potassium 3.5 - 5.1 mmol/L 4.5  4.5  4.1   Chloride 98 - 111 mmol/L 104  107  107   CO2 22 - 32 mmol/L 29  29  28    Calcium 8.9 - 10.3 mg/dL 10.2  9.8  9.4   Total Protein 6.5 - 8.1 g/dL 7.3  7.4  6.9   Total Bilirubin 0.3 - 1.2 mg/dL 0.6  0.4  0.5   Alkaline Phos 38 - 126 U/L 78  92  72   AST 15 - 41 U/L 27  23  22    ALT 0 - 44 U/L 25  24  17      Lab Results  Component Value Date   WBC 4.4 02/12/2022   HGB 13.8 02/12/2022   HCT 39.5 02/12/2022   MCV 88.4 02/12/2022   PLT 158 02/12/2022   NEUTROABS 2.5 02/12/2022    ASSESSMENT & PLAN:  No problem-specific Assessment & Plan notes found for this encounter.    No orders of the defined types were placed in this encounter.  The patient has a good understanding of the overall plan. she agrees with it. she will call with any problems that may develop before the next visit here. Total time spent: 30 mins including face to face time and time spent for planning, charting and co-ordination of care   Suzzette Righter, [place] 02/16/23    I Gardiner Coins am acting as a Education administrator for [employer]  ***

## 2023-02-17 ENCOUNTER — Inpatient Hospital Stay (HOSPITAL_BASED_OUTPATIENT_CLINIC_OR_DEPARTMENT_OTHER): Payer: BC Managed Care – PPO | Admitting: Hematology and Oncology

## 2023-02-17 ENCOUNTER — Inpatient Hospital Stay: Payer: BC Managed Care – PPO | Attending: Hematology and Oncology

## 2023-02-17 VITALS — BP 149/88 | HR 71 | Resp 20 | Wt 133.1 lb

## 2023-02-17 DIAGNOSIS — Z923 Personal history of irradiation: Secondary | ICD-10-CM | POA: Diagnosis not present

## 2023-02-17 DIAGNOSIS — Z853 Personal history of malignant neoplasm of breast: Secondary | ICD-10-CM | POA: Diagnosis present

## 2023-02-17 DIAGNOSIS — Z9221 Personal history of antineoplastic chemotherapy: Secondary | ICD-10-CM | POA: Insufficient documentation

## 2023-02-17 DIAGNOSIS — C50512 Malignant neoplasm of lower-outer quadrant of left female breast: Secondary | ICD-10-CM

## 2023-02-17 DIAGNOSIS — Z171 Estrogen receptor negative status [ER-]: Secondary | ICD-10-CM | POA: Diagnosis not present

## 2023-02-17 DIAGNOSIS — Z9012 Acquired absence of left breast and nipple: Secondary | ICD-10-CM | POA: Diagnosis not present

## 2023-02-17 LAB — CBC WITH DIFFERENTIAL (CANCER CENTER ONLY)
Abs Immature Granulocytes: 0.02 10*3/uL (ref 0.00–0.07)
Basophils Absolute: 0 10*3/uL (ref 0.0–0.1)
Basophils Relative: 1 %
Eosinophils Absolute: 0.1 10*3/uL (ref 0.0–0.5)
Eosinophils Relative: 1 %
HCT: 38.8 % (ref 36.0–46.0)
Hemoglobin: 13.3 g/dL (ref 12.0–15.0)
Immature Granulocytes: 0 %
Lymphocytes Relative: 36 %
Lymphs Abs: 1.8 10*3/uL (ref 0.7–4.0)
MCH: 30.3 pg (ref 26.0–34.0)
MCHC: 34.3 g/dL (ref 30.0–36.0)
MCV: 88.4 fL (ref 80.0–100.0)
Monocytes Absolute: 0.3 10*3/uL (ref 0.1–1.0)
Monocytes Relative: 7 %
Neutro Abs: 2.7 10*3/uL (ref 1.7–7.7)
Neutrophils Relative %: 55 %
Platelet Count: 228 10*3/uL (ref 150–400)
RBC: 4.39 MIL/uL (ref 3.87–5.11)
RDW: 11.9 % (ref 11.5–15.5)
WBC Count: 4.9 10*3/uL (ref 4.0–10.5)
nRBC: 0 % (ref 0.0–0.2)

## 2023-02-17 LAB — CMP (CANCER CENTER ONLY)
ALT: 26 U/L (ref 0–44)
AST: 23 U/L (ref 15–41)
Albumin: 4.3 g/dL (ref 3.5–5.0)
Alkaline Phosphatase: 99 U/L (ref 38–126)
Anion gap: 4 — ABNORMAL LOW (ref 5–15)
BUN: 11 mg/dL (ref 6–20)
CO2: 31 mmol/L (ref 22–32)
Calcium: 9.7 mg/dL (ref 8.9–10.3)
Chloride: 105 mmol/L (ref 98–111)
Creatinine: 0.62 mg/dL (ref 0.44–1.00)
GFR, Estimated: >60 mL/min (ref >60)
Glucose, Bld: 95 mg/dL (ref 70–99)
Potassium: 4.1 mmol/L (ref 3.5–5.1)
Sodium: 140 mmol/L (ref 135–145)
Total Bilirubin: 0.4 mg/dL (ref 0.3–1.2)
Total Protein: 6.9 g/dL (ref 6.5–8.1)

## 2023-02-20 ENCOUNTER — Encounter: Payer: Self-pay | Admitting: Hematology and Oncology

## 2023-05-20 ENCOUNTER — Other Ambulatory Visit: Payer: Self-pay | Admitting: Hematology and Oncology

## 2024-01-26 ENCOUNTER — Ambulatory Visit (AMBULATORY_SURGERY_CENTER): Payer: 59

## 2024-01-26 VITALS — Ht 61.0 in | Wt 130.0 lb

## 2024-01-26 DIAGNOSIS — Z800 Family history of malignant neoplasm of digestive organs: Secondary | ICD-10-CM

## 2024-01-26 DIAGNOSIS — Z860100 Personal history of colon polyps, unspecified: Secondary | ICD-10-CM

## 2024-01-26 NOTE — Progress Notes (Signed)
No egg or soy allergy known to patient  No issues known to pt with past sedation with any surgeries or procedures Patient denies ever being told they had issues or difficulty with intubation  No FH of Malignant Hyperthermia Pt is not on diet pills Pt is not on  home 02  Pt is not on blood thinners  Pt denies issues with constipation  No A fib or A flutter Have any cardiac testing pending-- no  LOA: independent  Prep: split dose miralax  Patient's chart reviewed by Cathlyn Parsons CNRA prior to previsit and patient appropriate for the [facility].  Previsit completed and red dot placed by patient's name on their procedure day (on provider's schedule).     PV completed with patient. Prep instructions sent via mychart and home address.

## 2024-02-09 ENCOUNTER — Encounter: Payer: Self-pay | Admitting: Internal Medicine

## 2024-02-15 NOTE — Progress Notes (Unsigned)
[facility] History and Physical   Primary Care Physician:  Olive Bass, MD   Reason for Procedure:   Hx colon polyps + FHx CRCA  Plan:    colonoscopy     HPI: Monica Padilla is a 58 y.o. female w/ father and grandfather having had colon ca. She had a 5 mm ssp in 2019 and a 1 cm hpp of transverse colon in 2014.  No technical issues w/ prior colonoscopies.   Past Medical History:  Diagnosis Date   Breast cancer (HCC)    left    Cancer (HCC)    basal cell CA- chest, leg   Family history of breast cancer    Family history of colon cancer    Family history of lung cancer    GERD (gastroesophageal reflux disease)    History of chemotherapy    finished chemo 08-26-2018   History of radiation therapy    last 11-01-2018   Hx of colonic polyp 02/08/2019    Past Surgical History:  Procedure Laterality Date   CESAREAN SECTION  1998   x1   COLONOSCOPY     DILATION AND CURETTAGE OF UTERUS     fatty tumor removal  06/2016   lower right back    left mastectomy Left 03/2018   PARTIAL HYSTERECTOMY     SKIN CANCER EXCISION     VAGINAL DELIVERY     x1   WISDOM TOOTH EXTRACTION      Prior to Admission medications   Medication Sig Start Date End Date Taking? Authorizing Provider  cholecalciferol (VITAMIN D3) 25 MCG (1000 UNIT) tablet Take 2,000 Units by mouth daily. 12/29/17   [provider]  Multiple Vitamins-Minerals (WOMENS MULTI VITAMIN & MINERAL) TABS Take 1 each by mouth.    [provider]    Current Outpatient Medications  Medication Sig Dispense Refill   cholecalciferol (VITAMIN D3) 25 MCG (1000 UNIT) tablet Take 2,000 Units by mouth daily.     Multiple Vitamins-Minerals (WOMENS MULTI VITAMIN & MINERAL) TABS Take 1 each by mouth.     No current facility-administered medications for this visit.    Allergies as of 02/16/2024 - Review Complete 02/15/2024  Allergen Reaction Noted   Codeine Itching 10/04/2013   Nitrofurantoin   01/19/2008   Amoxicillin Rash 11/25/2016   Pertuzumab Rash 09/16/2018    Family History  Problem Relation Age of Onset   Colon cancer Father 35   Basal cell carcinoma Sister        2-3   Lung cancer Maternal Aunt        hx smoking   Lung cancer Paternal Uncle        hx smoking   Heart disease Maternal Grandmother    Heart disease Maternal Grandfather    Cancer Paternal Grandfather        unk if colon or stomach? died in early 70's.    Breast cancer Cousin    Esophageal cancer Neg Hx    Stomach cancer Neg Hx    Rectal cancer Neg Hx    Colon polyps Neg Hx     Social History   Socioeconomic History   Marital status: Married    Spouse name: Not on file   Number of children: Not on file   Years of education: Not on file   Highest education level: Not on file  Occupational History   Not on file  Tobacco Use   Smoking status: Never   Smokeless tobacco: Never  Vaping Use   Vaping status: Never Used  Substance and Sexual Activity   Alcohol use: Yes    Comment: occasional   Drug use: No   Sexual activity: Not on file  Other Topics Concern   Not on file  Social History Narrative   Not on file   Social Drivers of Health   Financial Resource Strain: Not on file  Food Insecurity: Not on file  Transportation Needs: Not on file  Physical Activity: Not on file  Stress: Not on file  Social Connections: Not on file  Intimate Partner Violence: Not on file    Review of Systems: Positive for *** All other review of systems negative except as mentioned in the HPI.  Physical Exam: Vital signs There were no vitals taken for this visit.  General:   Alert,  Well-developed, well-nourished, pleasant and cooperative in NAD Lungs:  Clear throughout to auscultation.   Heart:  Regular rate and rhythm; no murmurs, clicks, rubs,  or gallops. Abdomen:  Soft, nontender and nondistended. Normal bowel sounds.   Neuro/Psych:  Alert and cooperative. Normal mood and affect. A and O x  3   @Burdell Peed  Sena Slate, MD, [facility] [phone] (pager) 02/15/2024 5:37 PM@

## 2024-02-16 ENCOUNTER — Encounter: Payer: Self-pay | Admitting: Internal Medicine

## 2024-02-16 ENCOUNTER — Ambulatory Visit (AMBULATORY_SURGERY_CENTER): Payer: 59 | Admitting: Internal Medicine

## 2024-02-16 VITALS — BP 167/83 | HR 82 | Temp 98.1°F | Resp 13 | Ht 61.0 in | Wt 130.0 lb

## 2024-02-16 DIAGNOSIS — D123 Benign neoplasm of transverse colon: Secondary | ICD-10-CM

## 2024-02-16 DIAGNOSIS — Z800 Family history of malignant neoplasm of digestive organs: Secondary | ICD-10-CM | POA: Diagnosis not present

## 2024-02-16 DIAGNOSIS — Z860101 Personal history of adenomatous and serrated colon polyps: Secondary | ICD-10-CM

## 2024-02-16 DIAGNOSIS — Z1211 Encounter for screening for malignant neoplasm of colon: Secondary | ICD-10-CM | POA: Diagnosis present

## 2024-02-16 DIAGNOSIS — K648 Other hemorrhoids: Secondary | ICD-10-CM | POA: Diagnosis not present

## 2024-02-16 DIAGNOSIS — Z860100 Personal history of colon polyps, unspecified: Secondary | ICD-10-CM

## 2024-02-16 MED ORDER — SODIUM CHLORIDE 0.9 % IV SOLN: 500.0000 mL | Freq: Once | INTRAVENOUS | Status: DC

## 2024-02-16 NOTE — Progress Notes (Signed)
 Report given to PACU, vss

## 2024-02-16 NOTE — Progress Notes (Signed)
1610 Patient experiencing nausea and vomiting.  MD updated and Zofran 4 mg IV given, vss

## 2024-02-16 NOTE — Progress Notes (Signed)
 Called to room to assist during endoscopic procedure.  Patient ID and intended procedure confirmed with present staff. Received instructions for my participation in the procedure from the performing physician.

## 2024-02-16 NOTE — Progress Notes (Signed)
 Pt's states no medical or surgical changes since previsit or office visit.

## 2024-02-16 NOTE — Patient Instructions (Addendum)
There were 2 polyps - one large and flat and another tiny. I will let you know pathology results and when to have another routine colonoscopy by mail and/or My Chart.  Hemorrhoids were swollen some.  I appreciate the opportunity to care for you. Iva Boop, MD, [facility]  Handout provided on polyps and hemorrhoids.    YOU HAD AN ENDOSCOPIC PROCEDURE TODAY AT THE [facility]:   Refer to the procedure report that was given to you for any specific questions about what was found during the examination.  If the procedure report does not answer your questions, please call your gastroenterologist to clarify.  If you requested that your care partner not be given the details of your procedure findings, then the procedure report has been included in a sealed envelope for you to review at your convenience later.  YOU SHOULD EXPECT: Some feelings of bloating in the abdomen. Passage of more gas than usual.  Walking can help get rid of the air that was put into your GI tract during the procedure and reduce the bloating. If you had a lower endoscopy (such as a colonoscopy or flexible sigmoidoscopy) you may notice spotting of blood in your stool or on the toilet paper. If you underwent a bowel prep for your procedure, you may not have a normal bowel movement for a few days.  Please Note:  You might notice some irritation and congestion in your nose or some drainage.  This is from the oxygen used during your procedure.  There is no need for concern and it should clear up in a day or so.  SYMPTOMS TO REPORT IMMEDIATELY:  Following lower endoscopy (colonoscopy or flexible sigmoidoscopy):  Excessive amounts of blood in the stool  Significant tenderness or worsening of abdominal pains  Swelling of the abdomen that is new, acute  Fever of 100F or higher  For urgent or emergent issues, a gastroenterologist can be reached at any hour by calling [phone]. Do not use MyChart messaging for urgent  concerns.    DIET:  We do recommend a small meal at first, but then you may proceed to your regular diet.  Drink plenty of fluids but you should avoid alcoholic beverages for 24 hours.  ACTIVITY:  You should plan to take it easy for the rest of today and you should NOT DRIVE or use heavy machinery until tomorrow (because of the sedation medicines used during the test).    FOLLOW UP: Our staff will call the number listed on your records the next business day following your procedure.  We will call around 7:15- 8:00 am to check on you and address any questions or concerns that you may have regarding the information given to you following your procedure. If we do not reach you, we will leave a message.     If any biopsies were taken you will be contacted by phone or by letter within the next 1-3 weeks.  Please call us at [phone] if you have not heard about the biopsies in 3 weeks.    SIGNATURES/CONFIDENTIALITY: You and/or your care partner have signed paperwork which will be entered into your electronic medical record.  These signatures attest to the fact that that the information above on your After Visit Summary has been reviewed and is understood.  Full responsibility of the confidentiality of this discharge information lies with you and/or your care-partner.

## 2024-02-16 NOTE — Op Note (Signed)
[facility] Patient Name: Monica Padilla Procedure Date: 02/16/2024 8:20 AM MRN: [id number] Endoscopist: Iva Boop , MD, [id number] Age: 58 Referring MD:  Date of Birth: 1966-03-19 Gender: Female Account #: [id number] Procedure:                Colonoscopy Indications:              High risk colon cancer surveillance: Personal                            history of sessile serrated colon polyp (less than                            10 mm in size) with no dysplasia + Family Hx CRCA                            grandfather (suspected) and father Medicines:                Monitored Anesthesia Care Procedure:                Pre-Anesthesia Assessment:                           - Prior to the procedure, a History and Physical                            was performed, and patient medications and                            allergies were reviewed. The patient's tolerance of                            previous anesthesia was also reviewed. The risks                            and benefits of the procedure and the sedation                            options and risks were discussed with the patient.                            All questions were answered, and informed consent                            was obtained. Prior Anticoagulants: The patient has                            taken no anticoagulant or antiplatelet agents. ASA                            Grade Assessment: II - A patient with mild systemic                            disease. After reviewing the risks and benefits,  the patient was deemed in satisfactory condition to                            undergo the procedure.                           After obtaining informed consent, the colonoscope                            was passed under direct vision. Throughout the                            procedure, the patient's blood pressure, pulse, and                            oxygen saturations were  monitored continuously. The                            Olympus Scope [id number] was introduced through the                            anus and advanced to the the terminal ileum, with                            identification of the appendiceal orifice and IC                            valve. The colonoscopy was performed without                            difficulty. The patient tolerated the procedure                            well. The quality of the bowel preparation was                            good. The ileocecal valve, appendiceal orifice, and                            rectum were photographed. The bowel preparation                            used was Miralax via split dose instruction. Scope In: 8:32:57 AM Scope Out: 9:02:34 AM Scope Withdrawal Time: 0 hours 26 minutes 23 seconds  Total Procedure Duration: 0 hours 29 minutes 37 seconds  Findings:                 The perianal and digital rectal examinations were                            normal.                           A 20 to 25 mm polyp was found in the hepatic  flexure. The polyp was flat. The polyp was removed                            with a saline injection-lift technique using a cold                            snare and The polyp was removed with a piecemeal                            technique using a cold snare. Resection and                            retrieval were complete. Area was tattooed with an                            injection of 3 mL of Spot (carbon black). Distal to                            lesion - see photo                           A 3 mm polyp was found in the distal transverse                            colon. The polyp was sessile. The polyp was removed                            with a cold snare. Resection and retrieval were                            complete. Verification of patient identification                            for the specimen was done. Estimated  blood loss was                            minimal.                           Internal hemorrhoids were found.                           The exam was otherwise without abnormality on                            direct and retroflexion views. Complications:            No immediate complications. Estimated Blood Loss:     Estimated blood loss was minimal. Impression:               - One 20 to 25 mm polyp at the hepatic flexure,                            removed using injection-lift and a  cold snare and                            removed piecemeal using a cold snare. Resected and                            retrieved. Tattooed.                           - One 3 mm polyp in the distal transverse colon,                            removed with a cold snare. Resected and retrieved.                           - Internal hemorrhoids.                           - The examination was otherwise normal on direct                            and retroflexion views.                           - Personal history of colonic polyps. 5 mm ssp in                            2019 and 1a 10 mm proximal hpp 2014 + FHx CRCA as                            above Recommendation:           - Patient has a contact number available for                            emergencies. The signs and symptoms of potential                            delayed complications were discussed with the                            patient. Return to normal activities tomorrow.                            Written discharge instructions were provided to the                            patient.                           - Resume previous diet.                           - Continue present medications.                           -  Repeat colonoscopy is recommended for                            surveillance. The colonoscopy date will be                            determined after pathology results from today's                            exam become  available for review. Iva Boop, MD 02/16/2024 9:15:32 AM This report has been signed electronically.

## 2024-02-17 ENCOUNTER — Telehealth: Payer: Self-pay

## 2024-02-17 NOTE — Telephone Encounter (Signed)
  Follow up Call-     02/16/2024    7:48 AM  Call back number  Post procedure Call Back phone  # [phone]  Permission to leave phone message Yes     Patient questions:  Do you have a fever, pain , or abdominal swelling? No. Pain Score  0 *  Have you tolerated food without any problems? Yes.    Have you been able to return to your normal activities? Yes.    Do you have any questions about your discharge instructions: Diet   No. Medications  No. Follow up visit  No.  Do you have questions or concerns about your Care? No.  Actions: * If pain score is 4 or above: No action needed, pain <4.

## 2024-02-18 ENCOUNTER — Encounter: Payer: Self-pay | Admitting: Internal Medicine

## 2024-02-18 ENCOUNTER — Ambulatory Visit: Payer: 59 | Admitting: Hematology and Oncology

## 2024-02-18 LAB — SURGICAL PATHOLOGY

## 2024-03-02 ENCOUNTER — Telehealth: Payer: Self-pay

## 2024-03-02 ENCOUNTER — Inpatient Hospital Stay: Payer: 59 | Attending: Hematology and Oncology | Admitting: Hematology and Oncology

## 2024-03-02 ENCOUNTER — Encounter: Payer: Self-pay | Admitting: Obstetrics and Gynecology

## 2024-03-02 VITALS — BP 133/62 | HR 62 | Temp 97.9°F | Resp 18 | Ht 61.0 in | Wt 133.8 lb

## 2024-03-02 DIAGNOSIS — Z853 Personal history of malignant neoplasm of breast: Secondary | ICD-10-CM | POA: Diagnosis present

## 2024-03-02 DIAGNOSIS — C50512 Malignant neoplasm of lower-outer quadrant of left female breast: Secondary | ICD-10-CM | POA: Diagnosis not present

## 2024-03-02 DIAGNOSIS — Z9012 Acquired absence of left breast and nipple: Secondary | ICD-10-CM | POA: Diagnosis not present

## 2024-03-02 DIAGNOSIS — Z171 Estrogen receptor negative status [ER-]: Secondary | ICD-10-CM

## 2024-03-02 NOTE — Telephone Encounter (Signed)
 Per md orders entered for [employer] and all supported documents faxed to [phone]. Faxed confirmation was received.

## 2024-03-02 NOTE — Assessment & Plan Note (Signed)
 left mastectomy and sentinel lymph node sampling 04/02/2018 for a pT2 pN1, stage IIB invasive ductal carcinoma, grade 3, estrogen and progesterone receptor negative, but HER-2 amplified   adjuvant chemotherapy consisting of carboplatin and docetaxel given every 21 days x 6, started 05/13/2018, completed 08/26/2018 trastuzumab and Pertuzumab started 05/13/2018; trastuzumab continued to complete a year--last dose 05/05/2019 adjuvant radiation 09/06/18-11/01/18    Breast Cancer Surveillance: 1. Breast Exam: 03/02/2024: Benign 2. [facility]ammogram:  [facility]    Return to clinic in 1 year for follow-up Patient prefers to come visit as once a year for checkups.

## 2024-03-02 NOTE — Progress Notes (Signed)
 Patient Care Team: Olive Bass, MD as PCP - General (Family Medicine) Magrinat, Valentino Hue, MD (Inactive) as Consulting Physician (Oncology) Alesia Morin., MD as Referring Physician (Surgery) Iva Boop, MD as Consulting Physician (Gastroenterology) Norva Pavlov, MD as Consulting Physician (Diagnostic Radiology) Antony Blackbird, MD as Consulting Physician (Radiation Oncology) Zelphia Cairo, MD as Consulting Physician (Obstetrics and Gynecology)  DIAGNOSIS:  Encounter Diagnosis  Name Primary?   Malignant neoplasm of lower-outer quadrant of left breast of female, estrogen receptor negative (HCC) Yes    SUMMARY OF ONCOLOGIC HISTORY: Oncology History  Malignant neoplasm of lower-outer quadrant of left breast of female, estrogen receptor negative (HCC)  04/02/2018 Initial Diagnosis   58 y.o. [place], [place] woman status post left mastectomy and sentinel lymph node sampling 04/02/2018 for a pT2 pN1, stage IIB invasive ductal carcinoma, grade 3, estrogen and progesterone receptor negative, but HER-2 amplified             (a) not planning on reconstruction   04/02/2018 Cancer Staging   Staging form: Breast, AJCC 8th Edition - Pathologic stage from 04/02/2018: Stage IIB (pT2, pN1, cM0, G3, ER-, PR-, HER2+) - Signed by Loa Socks, NP on 09/20/2019   05/13/2018 -  Chemotherapy   Docetaxel and Carboplatin given every 3 weeks x 6 cycles, with gemcitabine substituted for docetaxel with cycle 6 due to neuropathy.  Trastuzumab and Pertuzumab given for 1 year 04/2018 through 04/2019   07/14/2018 Genetic Testing   A 65 gene panel was ordered from the laboratory [facility]. The following genes were evaluated for sequence changes and exonic deletions/duplications: APC, ATM, AXIN2, BAP1, BARD1, BLM, BMPR1A, BRCA1, BRCA2, BRIP1, BUB1B, CDC73, CDH1, CDK4, CDKN1C, CDKN2A (p14ARF), CDKN2A (p16INK4a), CEP57, CHEK2, CTNNA1, DICER1, DIS3L2, ENG, EPCAM*, FH, FLCN, GALNT12, GPC3, GREM1*, KIT,  MEN1, MET, MLH1, MLH3, MSH2, MSH3, MSH6, MUTYH, NBN, NF1, PALB2, PDGFRA, PMS2, POLD1, POLE, PTEN, RAD50, RAD51C, RAD51D, RPS20, SDHB, SDHC, SDHD, SMAD4, SMARCA4, SMARCB1, STK11, TP53, TSC1, TSC2, VHL, WT1. The following genes were evaluated for sequence changes only: EGFR*, HOXB13*, NTHL1*, SDHA  Results: No pathogenic variants identified.  A variant of uncertain significance in the gene SMARCA4 was identified c.[id number] (p.[id number]).  The date of this teste report is 07/14/2018.    09/06/2018 - 11/01/2018 Radiation Therapy   Site/dose:  1. Left Axilla, 1.8 Gy in 25 fractions for a total of 45 Gy                     2. Left CW, 1.8 Gy in 28 fractions for a total of 50.4 Gy                     3. Boost, 2 Gy in 5 fractions for a total of 10 Gy     CHIEF COMPLIANT: Surveillance of breast cancer  HISTORY OF PRESENT ILLNESS:  History of Present Illness The patient, a six-year breast cancer survivor, presents for her annual follow-up. She reports no new symptoms or concerns. She has been adhering to her recommended surveillance, including a mammogram in October of the previous year, which showed no abnormalities other than dense breast tissue. She has been maintaining a healthy lifestyle, including regular exercise at a local gym. She has recently become a grandparent and is enjoying spending time with her new grandchild.     ALLERGIES:  is allergic to amoxicillin, codeine, nitrofurantoin, and pertuzumab.  MEDICATIONS:  Current Outpatient Medications  Medication Sig Dispense Refill   cholecalciferol (VITAMIN D3) 25 MCG (  1000 UNIT) tablet Take 2,000 Units by mouth daily.     Multiple Vitamins-Minerals (WOMENS MULTI VITAMIN & MINERAL) TABS Take 1 each by mouth.     No current facility-administered medications for this visit.    PHYSICAL EXAMINATION: ECOG PERFORMANCE STATUS: 1 - Symptomatic but completely ambulatory  Vitals:   03/02/24 0922  BP: 133/62  Pulse: 62  Resp: 18  Temp: 97.9 F  (36.6 C)  SpO2: 100%   Filed Weights   03/02/24 0922  Weight: 133 lb 12.8 oz (60.7 kg)    Physical Exam BREAST: No palpable lumps or nodules in the back, chest wall, or axilla.  (exam performed in the presence of a chaperone)  LABORATORY DATA:  I have reviewed the data as listed    Latest Ref Rng & Units 02/17/2023    9:58 AM 02/12/2022   10:10 AM 02/11/2021   10:02 AM  CMP  Glucose 70 - 99 mg/dL 95  96  91   BUN 6 - 20 mg/dL 11  13  10    Creatinine 0.44 - 1.00 mg/dL 6.29  5.28  4.13   Sodium 135 - 145 mmol/L 140  139  141   Potassium 3.5 - 5.1 mmol/L 4.1  4.5  4.5   Chloride 98 - 111 mmol/L 105  104  107   CO2 22 - 32 mmol/L 31  29  29    Calcium 8.9 - 10.3 mg/dL 9.7  24.4  9.8   Total Protein 6.5 - 8.1 g/dL 6.9  7.3  7.4   Total Bilirubin 0.3 - 1.2 mg/dL 0.4  0.6  0.4   Alkaline Phos 38 - 126 U/L 99  78  92   AST 15 - 41 U/L 23  27  23    ALT 0 - 44 U/L 26  25  24      Lab Results  Component Value Date   WBC 4.9 02/17/2023   HGB 13.3 02/17/2023   HCT 38.8 02/17/2023   MCV 88.4 02/17/2023   PLT 228 02/17/2023   NEUTROABS 2.7 02/17/2023    ASSESSMENT & PLAN:  Malignant neoplasm of lower-outer quadrant of left breast of female, estrogen receptor negative (HCC) left mastectomy and sentinel lymph node sampling 04/02/2018 for a pT2 pN1, stage IIB invasive ductal carcinoma, grade 3, estrogen and progesterone receptor negative, but HER-2 amplified   adjuvant chemotherapy consisting of carboplatin and docetaxel given every 21 days x 6, started 05/13/2018, completed 08/26/2018 trastuzumab and Pertuzumab started 05/13/2018; trastuzumab continued to complete a year--last dose 05/05/2019 adjuvant radiation 09/06/18-11/01/18    Breast Cancer Surveillance: 1. Breast Exam: 03/02/2024: Benign 2. Mammogram:  [facility]    Return to clinic in 1 year for follow-up Patient prefers to come visit as once a year for checkups. ------------------------------------- Assessment  and Plan Assessment & Plan Breast Cancer Surveillance Six years from diagnosis. No palpable lumps or nodules in the back, chest wall, or axilla. Mammogram in October showed dense breasts but no other abnormalities. -Continue annual mammograms. -Initiate blood testing for cancer DNA every six months for early detection of recurrence. The company will send someone to the patient's home for blood draw.  General Health Maintenance Regular exercise regimen at the gym. -Continue regular exercise.  Follow-up in one year.      No orders of the defined types were placed in this encounter.  The patient has a good understanding of the overall plan. she agrees with it. she will call with any problems that may develop  before the next visit here. Total time spent: 30 mins including face to face time and time spent for planning, charting and co-ordination of care   Tamsen Meek, MD 03/02/24

## 2024-03-07 ENCOUNTER — Telehealth: Payer: Self-pay

## 2024-03-07 NOTE — Telephone Encounter (Signed)
 Pt called to let us know she does not want to participate in [employer] testing at this time. She reports she is too anxious to have this testing and thinks it would negatively impact her mentally. She cancelled with mobile phlebotomy. Validated pt's concerns and she was advised to reach out if she decides to change her mind. She verbalized agreement.

## 2024-04-29 NOTE — Progress Notes (Signed)
 MD ordered [employer] on pt. Patient did not respond to [employer] when they reached out. If pt returns call to set up testing, number is [phone].

## 2024-05-13 ENCOUNTER — Ambulatory Visit (HOSPITAL_BASED_OUTPATIENT_CLINIC_OR_DEPARTMENT_OTHER)
Admission: EM | Admit: 2024-05-13 | Discharge: 2024-05-13 | Disposition: A | Discharge status: Home / Self Care | Attending: Family Medicine | Admitting: Family Medicine

## 2024-05-13 ENCOUNTER — Encounter (HOSPITAL_BASED_OUTPATIENT_CLINIC_OR_DEPARTMENT_OTHER): Payer: Self-pay | Admitting: Emergency Medicine

## 2024-05-13 DIAGNOSIS — J301 Allergic rhinitis due to pollen: Secondary | ICD-10-CM | POA: Diagnosis not present

## 2024-05-13 MED ORDER — TRIAMCINOLONE ACETONIDE 40 MG/ML IJ SUSP
40.0000 mg | Freq: Once | INTRAMUSCULAR | Status: AC
  Administered 2024-05-13: 40 mg via INTRAMUSCULAR

## 2024-05-13 NOTE — ED Triage Notes (Addendum)
 Pt c/o sore throat, cough and upper chest congestion x 1 week. Pt has taken advil and alka selter

## 2024-05-13 NOTE — Discharge Instructions (Signed)
 Exam and symptoms are most consistent with seasonal allergic rhinitis but could indicate of viral upper respiratory infection.  She just has a persistent irritant cough and has persistent nasal congestion for over a week.  She tried some over-the-counter products without success.  Will give Kenalog  40 mg (this is a steroid) injection in the office today.  Use over-the-counter cetirizine/Zyrtec or loratadine/Claritin nightly for allergies.  Follow-up if symptoms do not improve, worsen or new symptoms occur.

## 2024-05-13 NOTE — ED Provider Notes (Signed)
 [facility]    CSN: [id number] Arrival date & time: 05/13/24  1345      History   Chief Complaint Chief Complaint  Patient presents with   Sore Throat    HPI Monica Padilla is a 58 y.o. female.   Patient reports congestion since 05/05/2024 or 05/06/2024.  She has had cough, sore throat, head congestion and a dry cough that feels like it is between her throat and chest.  She is not getting anything out of her chest at this time.  She has clear nasal discharge that can sometimes be quite thick.  She has tried Advil and Alka-Seltzer cold and sinus with poor relief.   Sore Throat Pertinent negatives include no chest pain, no abdominal pain and no shortness of breath.    Past Medical History:  Diagnosis Date   Breast cancer (HCC)    left    Cancer (HCC)    basal cell CA- chest, leg   Family history of breast cancer    Family history of colon cancer    Family history of lung cancer    GERD (gastroesophageal reflux disease)    History of chemotherapy    finished chemo 08-26-2018   History of radiation therapy    last 11-01-2018   Hx of colonic polyp 02/08/2019    Patient Active Problem List   Diagnosis Date Noted   Acquired absence of breast 09/18/2020   Leukopenia due to antineoplastic chemotherapy (HCC) 03/03/2019   Hx of  sessile serrated colonic polyp 02/08/2019   Genetic testing 07/26/2018   Family history of colon cancer    Family history of breast cancer    Family history of lung cancer    Port-A-Cath in place 06/10/2018   Malignant neoplasm of lower-outer quadrant of left breast of female, estrogen receptor negative (HCC) 05/03/2018    Past Surgical History:  Procedure Laterality Date   CESAREAN SECTION  1998   x1   COLONOSCOPY     DILATION AND CURETTAGE OF UTERUS     fatty tumor removal  06/2016   lower right back    left mastectomy Left 03/2018   PARTIAL HYSTERECTOMY     SKIN CANCER EXCISION     VAGINAL DELIVERY     x1   WISDOM TOOTH  EXTRACTION      OB History   No obstetric history on file.      Home Medications    Prior to Admission medications   Medication Sig Start Date End Date Taking? Authorizing Provider  Multiple Vitamins-Minerals (WOMENS MULTI VITAMIN & MINERAL) TABS Take 1 each by mouth.   Yes [provider]  cholecalciferol (VITAMIN D3) 25 MCG (1000 UNIT) tablet Take 2,000 Units by mouth daily. 12/29/17   [provider]    Family History Family History  Problem Relation Age of Onset   Colon cancer Father 28   Basal cell carcinoma Sister        2-3   Lung cancer Maternal Aunt        hx smoking   Lung cancer Paternal Uncle        hx smoking   Heart disease Maternal Grandmother    Heart disease Maternal Grandfather    Cancer Paternal Grandfather        unk if colon or stomach? died in early 26's.    Breast cancer Cousin    Esophageal cancer Neg Hx    Stomach cancer Neg Hx    Rectal cancer Neg  Hx    Colon polyps Neg Hx     Social History Social History   Tobacco Use   Smoking status: Never   Smokeless tobacco: Never  Vaping Use   Vaping status: Never Used  Substance Use Topics   Alcohol use: Yes    Comment: occasional   Drug use: No     Allergies   Amoxicillin, Codeine, Nitrofurantoin, and Pertuzumab    Review of Systems Review of Systems  Constitutional:  Negative for chills and fever.  HENT:  Positive for congestion, postnasal drip, rhinorrhea and sinus pressure. Negative for ear pain and sore throat.   Eyes:  Negative for pain and visual disturbance.  Respiratory:  Positive for cough. Negative for shortness of breath.   Cardiovascular:  Negative for chest pain and palpitations.  Gastrointestinal:  Negative for abdominal pain, constipation, diarrhea, nausea and vomiting.  Genitourinary:  Negative for dysuria and hematuria.  Musculoskeletal:  Negative for arthralgias and back pain.  Skin:  Negative for color change and rash.  Neurological:  Negative for  seizures and syncope.  All other systems reviewed and are negative.    Physical Exam Triage Vital Signs ED Triage Vitals  Encounter Vitals Group     BP 05/13/24 1438 (!) 159/93     Systolic BP Percentile --      Diastolic BP Percentile --      Pulse Rate 05/13/24 1438 78     Resp 05/13/24 1438 18     Temp 05/13/24 1438 98.4 F (36.9 C)     Temp Source 05/13/24 1438 Oral     SpO2 05/13/24 1438 97 %     Weight --      Height --      Head Circumference --      Peak Flow --      Pain Score 05/13/24 1437 0     Pain Loc --      Pain Education --      Exclude from Growth Chart --    No data found.  Updated Vital Signs BP (!) 159/93 (BP Location: Right Arm)   Pulse 78   Temp 98.4 F (36.9 C) (Oral)   Resp 18   SpO2 97%   Visual Acuity Right Eye Distance:   Left Eye Distance:   Bilateral Distance:    Right Eye Near:   Left Eye Near:    Bilateral Near:     Physical Exam Vitals and nursing note reviewed.  Constitutional:      General: She is not in acute distress.    Appearance: She is well-developed. She is not ill-appearing or toxic-appearing.  HENT:     Head: Normocephalic and atraumatic.     Right Ear: Hearing, tympanic membrane, ear canal and external ear normal.     Left Ear: Hearing, tympanic membrane, ear canal and external ear normal.     Nose: Congestion and rhinorrhea present. Rhinorrhea is clear.     Right Sinus: No maxillary sinus tenderness or frontal sinus tenderness.     Left Sinus: No maxillary sinus tenderness or frontal sinus tenderness.     Comments: She has very mild maxillary and frontal sinus pressure but no sinus pain    Mouth/Throat:     Lips: Pink.     Mouth: Mucous membranes are moist.     Pharynx: Uvula midline. No oropharyngeal exudate or posterior oropharyngeal erythema.     Tonsils: No tonsillar exudate.  Eyes:     Conjunctiva/sclera: Conjunctivae normal.  Pupils: Pupils are equal, round, and reactive to light.  Cardiovascular:      Rate and Rhythm: Normal rate and regular rhythm.     Heart sounds: S1 normal and S2 normal. No murmur heard. Pulmonary:     Effort: Pulmonary effort is normal. No respiratory distress.     Breath sounds: Normal breath sounds. No decreased breath sounds, wheezing, rhonchi or rales.  Abdominal:     General: Bowel sounds are normal.     Palpations: Abdomen is soft.     Tenderness: There is no abdominal tenderness.  Musculoskeletal:        General: No swelling.     Cervical back: Neck supple.  Lymphadenopathy:     Head:     Right side of head: No submental, submandibular, tonsillar, preauricular or posterior auricular adenopathy.     Left side of head: No submental, submandibular, tonsillar, preauricular or posterior auricular adenopathy.     Cervical: No cervical adenopathy.     Right cervical: No superficial cervical adenopathy.    Left cervical: No superficial cervical adenopathy.  Skin:    General: Skin is warm and dry.     Capillary Refill: Capillary refill takes less than 2 seconds.     Findings: No rash.  Neurological:     Mental Status: She is alert and oriented to person, place, and time.  Psychiatric:        Mood and Affect: Mood normal.      UC Treatments / Results  Labs (all labs ordered are listed, but only abnormal results are displayed) Labs Reviewed - No data to display  EKG   Radiology No results found.  Procedures Procedures (including critical care time)  Medications Ordered in UC Medications  triamcinolone  acetonide (KENALOG -40) injection 40 mg (40 mg Intramuscular Given 05/13/24 1547)    Initial Impression / Assessment and Plan / UC Course  I have reviewed the triage vital signs and the nursing notes.  Pertinent labs & imaging results that were available during my care of the patient were reviewed by me and considered in my medical decision making (see chart for details).     Plan of Care: Seasonal allergic rhinitis versus viral upper  respiratory infection: Exam is most consistent with seasonal allergic rhinitis.  Discussed treatment options including fluticasone nasal spray and OTC allergy medicine such as loratadine or cetirizine.  Patient is miserable with a persistent cough and a persistent nasal drip and she has a heavy/busy weekend and would like something that would really work for her.  Kenalog  40 mg steroid injection now.  Encouraged cetirizine or Zyrtec allergy medicine, 10 mg nightly.  Follow-up if symptoms do not improve, worsen or new symptoms occur. Final Clinical Impressions(s) / UC Diagnoses   Final diagnoses:  Seasonal allergic rhinitis due to pollen     Discharge Instructions      Exam and symptoms are most consistent with seasonal allergic rhinitis but could indicate of viral upper respiratory infection.  She just has a persistent irritant cough and has persistent nasal congestion for over a week.  She tried some over-the-counter products without success.  Will give Kenalog  40 mg (this is a steroid) injection in the office today.  Use over-the-counter cetirizine/Zyrtec or loratadine/Claritin nightly for allergies.  Follow-up if symptoms do not improve, worsen or new symptoms occur.   ED Prescriptions   None    PDMP not reviewed this encounter.   Guss Legacy, FNP 05/13/24 1550

## 2024-07-29 ENCOUNTER — Ambulatory Visit

## 2024-07-29 ENCOUNTER — Encounter: Payer: Self-pay | Admitting: Internal Medicine

## 2024-07-29 ENCOUNTER — Encounter

## 2024-07-29 VITALS — Ht 61.0 in | Wt 126.0 lb

## 2024-07-29 DIAGNOSIS — Z860100 Personal history of colon polyps, unspecified: Secondary | ICD-10-CM

## 2024-07-29 DIAGNOSIS — Z800 Family history of malignant neoplasm of digestive organs: Secondary | ICD-10-CM

## 2024-07-29 NOTE — Progress Notes (Signed)
 No egg or soy allergy known to patient  No issues known to pt with past sedation with any surgeries or procedures Patient denies ever being told they had issues or difficulty with intubation  No FH of Malignant Hyperthermia Pt is not on diet pills Pt is not on  home 02  Pt is not on blood thinners  Pt denies issues with constipation  No A fib or A flutter Have any cardiac testing pending--no  LOA: independent Prep: spilt dose miralax   Patient's chart reviewed by Cathlyn Parsons CNRA prior to previsit and patient appropriate for the [facility].  Previsit completed and red dot placed by patient's name on their procedure day (on provider's schedule).     PV completed with patient. Prep instructions sent via mychart and home address.

## 2024-08-16 NOTE — Progress Notes (Unsigned)
 [facility] History and Physical   Primary Care Physician:  Ofilia Lamar CROME, MD   Reason for Procedure:   Close follow-up after piecemeal removal large colon polyp  Plan:    colonoscopy     HPI: Monica Padilla is a 58 y.o. female w/ hx colon polyps and family hx colon cancer and had a large sessile serrated polyp at hepatic flexure removed 01/2024. Here for close follow-up to ensure removal.   Past Medical History:  Diagnosis Date   Breast cancer (HCC)    left    Cancer (HCC)    basal cell CA- chest, leg   Family history of breast cancer    Family history of colon cancer    Family history of lung cancer    GERD (gastroesophageal reflux disease)    History of chemotherapy    finished chemo 08-26-2018   History of radiation therapy    last 11-01-2018   Hx of colonic polyp 02/08/2019    Past Surgical History:  Procedure Laterality Date   CESAREAN SECTION  1998   x1   COLONOSCOPY     DILATION AND CURETTAGE OF UTERUS     fatty tumor removal  06/2016   lower right back    left mastectomy Left 03/2018   PARTIAL HYSTERECTOMY     SKIN CANCER EXCISION     VAGINAL DELIVERY     x1   WISDOM TOOTH EXTRACTION       Current Outpatient Medications  Medication Sig Dispense Refill   ascorbic acid (VITAMIN C) 500 MG tablet Take 500 mg by mouth daily.     cholecalciferol (VITAMIN D3) 25 MCG (1000 UNIT) tablet Take 2,000 Units by mouth daily.     Multiple Vitamins-Minerals (WOMENS MULTI VITAMIN & MINERAL) TABS Take 1 each by mouth.     No current facility-administered medications for this visit.    Allergies as of 08/17/2024 - Review Complete 07/29/2024  Allergen Reaction Noted   Amoxicillin Rash 11/25/2016   Codeine Itching 10/04/2013   Nitrofurantoin Other (See Comments) 01/19/2008   Pertuzumab  Rash 09/16/2018    Family History  Problem Relation Age of Onset   Colon cancer Father 54   Basal cell carcinoma Sister        2-3   Lung cancer Maternal Aunt         hx smoking   Lung cancer Paternal Uncle        hx smoking   Heart disease Maternal Grandmother    Heart disease Maternal Grandfather    Cancer Paternal Grandfather        unk if colon or stomach? died in early 81's.    Breast cancer Cousin    Esophageal cancer Neg Hx    Stomach cancer Neg Hx    Rectal cancer Neg Hx    Colon polyps Neg Hx     Social History   Socioeconomic History   Marital status: Married    Spouse name: Not on file   Number of children: Not on file   Years of education: Not on file   Highest education level: Not on file  Occupational History   Not on file  Tobacco Use   Smoking status: Never   Smokeless tobacco: Never  Vaping Use   Vaping status: Never Used  Substance and Sexual Activity   Alcohol use: Yes    Comment: occasional   Drug use: No   Sexual activity: Not on file  Other Topics Concern   Not  on file  Social History Narrative   Not on file   Social Drivers of Health   Financial Resource Strain: Not on file  Food Insecurity: Low Risk  (05/16/2024)   Received from [facility]   Hunger Vital Sign    Within the past 12 months, you worried that your food would run out before you got money to buy more: Never true    Within the past 12 months, the food you bought just didn't last and you didn't have money to get more. : Never true  Transportation Needs: No Transportation Needs (05/16/2024)   Received from [facility]    In the past 12 months, has lack of reliable transportation kept you from medical appointments, meetings, work or from getting things needed for daily living? : No  Physical Activity: Not on file  Stress: Not on file  Social Connections: Not on file  Intimate Partner Violence: Not on file    Review of Systems: Positive for *** All other review of systems negative except as mentioned in the HPI.  Physical Exam: Vital signs There were no vitals taken for this visit.  General:   Alert,   Well-developed, well-nourished, pleasant and cooperative in NAD Lungs:  Clear throughout to auscultation.   Heart:  Regular rate and rhythm; no murmurs, clicks, rubs,  or gallops. Abdomen:  Soft, nontender and nondistended. Normal bowel sounds.   Neuro/Psych:  Alert and cooperative. Normal mood and affect. A and O x 3   @Jacqueli Pangallo  CHARLENA Commander, MD, [facility] [phone] (pager) 08/16/2024 6:15 PM@

## 2024-08-17 ENCOUNTER — Ambulatory Visit: Admitting: Internal Medicine

## 2024-08-17 ENCOUNTER — Encounter: Payer: Self-pay | Admitting: Internal Medicine

## 2024-08-17 VITALS — BP 149/70 | HR 75 | Temp 98.0°F | Resp 16 | Ht 61.0 in | Wt 126.0 lb

## 2024-08-17 DIAGNOSIS — Z860101 Personal history of adenomatous and serrated colon polyps: Secondary | ICD-10-CM | POA: Diagnosis not present

## 2024-08-17 DIAGNOSIS — Z800 Family history of malignant neoplasm of digestive organs: Secondary | ICD-10-CM | POA: Diagnosis not present

## 2024-08-17 DIAGNOSIS — D123 Benign neoplasm of transverse colon: Secondary | ICD-10-CM

## 2024-08-17 DIAGNOSIS — Z1211 Encounter for screening for malignant neoplasm of colon: Secondary | ICD-10-CM | POA: Diagnosis present

## 2024-08-17 DIAGNOSIS — K635 Polyp of colon: Secondary | ICD-10-CM | POA: Diagnosis not present

## 2024-08-17 DIAGNOSIS — L818 Other specified disorders of pigmentation: Secondary | ICD-10-CM

## 2024-08-17 DIAGNOSIS — Z860100 Personal history of colon polyps, unspecified: Secondary | ICD-10-CM

## 2024-08-17 DIAGNOSIS — K648 Other hemorrhoids: Secondary | ICD-10-CM

## 2024-08-17 MED ORDER — SODIUM CHLORIDE 0.9 % IV SOLN
500.0000 mL | INTRAVENOUS | Status: DC
Start: 2024-08-17 — End: 2024-08-17

## 2024-08-17 NOTE — Patient Instructions (Addendum)
 There was some polyp remaining/regrowing at the site so I removed it.  I will let you know pathology results and when to have another routine colonoscopy by mail and/or My Chart.  I appreciate the opportunity to care for you. Monica CHARLENA Commander, MD, Monica Padilla   No aspirin, ibuprofen, naproxen, or other  non-steroidal anti-inflammatory drugs for 2 weeks after polyp removal.  Tylenol  is ok!  YOU HAD AN ENDOSCOPIC PROCEDURE TODAY AT THE [facility]:   Refer to the procedure report that was given to you for any specific questions about what was found during the examination.  If the procedure report does not answer your questions, please call your gastroenterologist to clarify.  If you requested that your care partner not be given the details of your procedure findings, then the procedure report has been included in a sealed envelope for you to review at your convenience later.  YOU SHOULD EXPECT: Some feelings of bloating in the abdomen. Passage of more gas than usual.  Walking can help get rid of the air that was put into your GI tract during the procedure and reduce the bloating. If you had a lower endoscopy (such as a colonoscopy or flexible sigmoidoscopy) you may notice spotting of blood in your stool or on the toilet paper. If you underwent a bowel prep for your procedure, you may not have a normal bowel movement for a few days.  Please Note:  You might notice some irritation and congestion in your nose or some drainage.  This is from the oxygen used during your procedure.  There is no need for concern and it should clear up in a day or so.  SYMPTOMS TO REPORT IMMEDIATELY:  Following lower endoscopy (colonoscopy or flexible sigmoidoscopy):  Excessive amounts of blood in the stool  Significant tenderness or worsening of abdominal pains  Swelling of the abdomen that is new, acute  Fever of 100F or higher  For urgent or emergent issues, a gastroenterologist can be reached at any hour by  calling [phone]. Do not use MyChart messaging for urgent concerns.    DIET:  We do recommend a small meal at first, but then you may proceed to your regular diet.  Drink plenty of fluids but you should avoid alcoholic beverages for 24 hours.  ACTIVITY:  You should plan to take it easy for the rest of today and you should NOT DRIVE or use heavy machinery until tomorrow (because of the sedation medicines used during the test).    FOLLOW UP: Our staff will call the number listed on your records the next business day following your procedure.  We will call around 7:15- 8:00 am to check on you and address any questions or concerns that you may have regarding the information given to you following your procedure. If we do not reach you, we will leave a message.     If any biopsies were taken you will be contacted by phone or by letter within the next 1-3 weeks.  Please call us  at [phone] if you have not heard about the biopsies in 3 weeks.    SIGNATURES/CONFIDENTIALITY: You and/or your care partner have signed paperwork which will be entered into your electronic medical record.  These signatures attest to the fact that that the information above on your After Visit Summary has been reviewed and is understood.  Full responsibility of the confidentiality of this discharge information lies with you and/or your care-partner.

## 2024-08-17 NOTE — Progress Notes (Signed)
 Called to room to assist during endoscopic procedure.  Patient ID and intended procedure confirmed with present staff. Received instructions for my participation in the procedure from the performing physician.

## 2024-08-17 NOTE — Progress Notes (Signed)
 Report to PACU, RN, vss, BBS= Clear.

## 2024-08-17 NOTE — Progress Notes (Signed)
 Pt's states no medical or surgical changes since previsit or office visit.

## 2024-08-17 NOTE — Op Note (Signed)
 [facility] Patient Name: Monica Padilla Procedure Date: 08/17/2024 1:35 PM MRN: [id number] Endoscopist: Lupita FORBES Commander , MD, [id number] Age: 58 Referring MD:  Date of Birth: May 27, 1966 Gender: Female Account #: [id number] Procedure:                Colonoscopy Indications:              High risk colon cancer surveillance: Personal                            history of colonic polyps - large sessile serrated                            polyp (20-49mm) removed piecemeal 01/2024 - close                            follow-up Medicines:                Monitored Anesthesia Care Procedure:                Pre-Anesthesia Assessment:                           - Prior to the procedure, a History and Physical                            was performed, and patient medications and                            allergies were reviewed. The patient's tolerance of                            previous anesthesia was also reviewed. The risks                            and benefits of the procedure and the sedation                            options and risks were discussed with the patient.                            All questions were answered, and informed consent                            was obtained. Prior Anticoagulants: The patient has                            taken no anticoagulant or antiplatelet agents. ASA                            Grade Assessment: II - A patient with mild systemic                            disease. After reviewing the risks and benefits,  the patient was deemed in satisfactory condition to                            undergo the procedure.                           After obtaining informed consent, the colonoscope                            was passed under direct vision. Throughout the                            procedure, the patient's blood pressure, pulse, and                            oxygen saturations were monitored continuously. The                             Olympus Scope [id number] was introduced through the                            anus and advanced to the the cecum, identified by                            appendiceal orifice and ileocecal valve. The                            colonoscopy was performed without difficulty. The                            patient tolerated the procedure well. The quality                            of the bowel preparation was adequate. The                            ileocecal valve, appendiceal orifice, and rectum                            were photographed. The bowel preparation used was                            Miralax via split dose instruction. Scope In: 1:39:46 PM Scope Out: 2:10:19 PM Scope Withdrawal Time: 0 hours 25 minutes 7 seconds  Total Procedure Duration: 0 hours 30 minutes 33 seconds  Findings:                 The perianal and digital rectal examinations were                            normal.                           A 12 mm polyp was found in the hepatic flexure.  proximal to prior tattoo. The polyp was sessile.                            The polyp was removed with a saline injection-lift                            technique using a cold snare and piecemeal                            technique (endoscopic mucosal resection). Resection                            and retrieval were complete. Soft tip coagulation                            treatment applied to polypectomy site edges to                            ablate. Verification of patient identification for                            the specimen was done. Estimated blood loss was                            minimal.                           Internal hemorrhoids were found.                           The exam was otherwise without abnormality on                            direct and retroflexion views. Complications:            No immediate complications. Estimated Blood Loss:      Estimated blood loss was minimal. Impression:               - One 12 mm polyp at the hepatic flexure (near                            prior tattoo = residula polyp from 01/2024                            resection), removed using injection-lift and a cold                            snare, piecemeal - endoscopic mucosal resection,                            and soft tip coagulation to edges. Resected and                            retrieved.                           -  Internal hemorrhoids.                           - The examination was otherwise normal on direct                            and retroflexion views.                           - Personal history of colonic polyps and family                            history of colon cancer in father and possibly                            grandfather. Recommendation:           - Patient has a contact number available for                            emergencies. The signs and symptoms of potential                            delayed complications were discussed with the                            patient. Return to normal activities tomorrow.                            Written discharge instructions were provided to the                            patient.                           - Resume previous diet.                           - Continue present medications.                           - Await pathology results.                           - Repeat colonoscopy is recommended. The                            colonoscopy date will be determined after pathology                            results from today's exam become available for                            review.                           - No aspirin, ibuprofen, naproxen, or other  non-steroidal anti-inflammatory drugs for 2 weeks                            after polyp removal. Lupita FORBES Commander, MD 08/17/2024 2:22:41 PM This report has been signed electronically.

## 2024-08-18 ENCOUNTER — Telehealth: Payer: Self-pay

## 2024-08-18 NOTE — Telephone Encounter (Signed)
 Left message

## 2024-08-22 LAB — SURGICAL PATHOLOGY

## 2024-08-26 ENCOUNTER — Ambulatory Visit: Payer: Self-pay | Admitting: Internal Medicine

## 2024-08-26 DIAGNOSIS — Z860100 Personal history of colon polyps, unspecified: Secondary | ICD-10-CM

## 2025-01-09 ENCOUNTER — Encounter: Payer: Self-pay | Admitting: Internal Medicine

## 2025-02-27 ENCOUNTER — Encounter

## 2025-03-06 ENCOUNTER — Inpatient Hospital Stay: Admitting: Hematology and Oncology

## 2025-03-06 ENCOUNTER — Ambulatory Visit: Admitting: Hematology and Oncology

## 2025-03-13 ENCOUNTER — Encounter: Admitting: Internal Medicine
